# Patient Record
Sex: Male | Born: 1955
Health system: Southern US, Community
[De-identification: ages and names within clinical notes are randomized; demographics above are authoritative.]

## PROBLEM LIST (undated history)

## (undated) DIAGNOSIS — N200 Calculus of kidney: Secondary | ICD-10-CM

## (undated) DIAGNOSIS — E785 Hyperlipidemia, unspecified: Secondary | ICD-10-CM

## (undated) DIAGNOSIS — C4491 Basal cell carcinoma of skin, unspecified: Secondary | ICD-10-CM

## (undated) DIAGNOSIS — Z860100 Personal history of colon polyps, unspecified: Secondary | ICD-10-CM

## (undated) DIAGNOSIS — C641 Malignant neoplasm of right kidney, except renal pelvis: Secondary | ICD-10-CM

## (undated) DIAGNOSIS — I10 Essential (primary) hypertension: Secondary | ICD-10-CM

## (undated) DIAGNOSIS — R112 Nausea with vomiting, unspecified: Secondary | ICD-10-CM

## (undated) DIAGNOSIS — K219 Gastro-esophageal reflux disease without esophagitis: Secondary | ICD-10-CM

## (undated) DIAGNOSIS — Z905 Acquired absence of kidney: Secondary | ICD-10-CM

## (undated) DIAGNOSIS — M1990 Unspecified osteoarthritis, unspecified site: Secondary | ICD-10-CM

## (undated) DIAGNOSIS — N189 Chronic kidney disease, unspecified: Secondary | ICD-10-CM

## (undated) DIAGNOSIS — Z9889 Other specified postprocedural states: Secondary | ICD-10-CM

## (undated) DIAGNOSIS — Z87442 Personal history of urinary calculi: Secondary | ICD-10-CM

## (undated) HISTORY — DX: Essential (primary) hypertension: I10

## (undated) HISTORY — DX: Malignant neoplasm of right kidney, except renal pelvis: C64.1

## (undated) HISTORY — DX: Personal history of colon polyps, unspecified: Z86.0100

## (undated) HISTORY — DX: Basal cell carcinoma of skin, unspecified: C44.91

## (undated) HISTORY — PX: VASECTOMY: SHX75

## (undated) HISTORY — DX: Personal history of colonic polyps: Z86.010

## (undated) HISTORY — DX: Acquired absence of kidney: Z90.5

## (undated) HISTORY — DX: Hyperlipidemia, unspecified: E78.5

## (undated) HISTORY — PX: ESOPHAGEAL DILATION: SHX303

---

## 1987-03-23 HISTORY — PX: OTHER SURGICAL HISTORY: SHX169

## 1995-04-09 ENCOUNTER — Encounter: Payer: Self-pay | Admitting: Family Medicine

## 1995-04-09 LAB — CONVERTED CEMR LAB
Blood Glucose, Fasting: 100 mg/dL
RBC count: 5.48 10*6/uL
WBC, blood: 7.3 10*3/uL

## 1995-04-12 ENCOUNTER — Encounter: Payer: Self-pay | Admitting: Family Medicine

## 1995-04-12 LAB — CONVERTED CEMR LAB
PSA: 0.79 ng/mL
TSH: 0.79 microintl units/mL

## 1997-10-22 ENCOUNTER — Ambulatory Visit (HOSPITAL_COMMUNITY): Admission: RE | Admit: 1997-10-22 | Discharge: 1997-10-22 | Payer: Self-pay | Admitting: Internal Medicine

## 1998-09-12 ENCOUNTER — Encounter: Payer: Self-pay | Admitting: Family Medicine

## 1998-09-12 LAB — CONVERTED CEMR LAB
Blood Glucose, Fasting: 90 mg/dL
RBC count: 5.48 10*6/uL
WBC, blood: 6.5 10*3/uL

## 1999-04-29 ENCOUNTER — Encounter: Payer: Self-pay | Admitting: Internal Medicine

## 1999-04-29 ENCOUNTER — Ambulatory Visit (HOSPITAL_COMMUNITY): Admission: RE | Admit: 1999-04-29 | Discharge: 1999-04-29 | Payer: Self-pay | Admitting: Internal Medicine

## 2004-02-21 ENCOUNTER — Ambulatory Visit: Payer: Self-pay | Admitting: Family Medicine

## 2004-03-13 ENCOUNTER — Emergency Department: Payer: Self-pay | Admitting: Unknown Physician Specialty

## 2004-08-21 ENCOUNTER — Ambulatory Visit: Payer: Self-pay | Admitting: Internal Medicine

## 2004-09-18 ENCOUNTER — Ambulatory Visit: Payer: Self-pay | Admitting: Internal Medicine

## 2004-09-18 ENCOUNTER — Encounter (INDEPENDENT_AMBULATORY_CARE_PROVIDER_SITE_OTHER): Payer: Self-pay | Admitting: Specialist

## 2004-09-18 LAB — HM COLONOSCOPY

## 2005-01-21 ENCOUNTER — Ambulatory Visit: Payer: Self-pay | Admitting: Family Medicine

## 2005-07-20 ENCOUNTER — Encounter: Admission: RE | Admit: 2005-07-20 | Discharge: 2005-07-20 | Payer: Self-pay | Admitting: Family Medicine

## 2005-07-20 ENCOUNTER — Ambulatory Visit: Payer: Self-pay | Admitting: Family Medicine

## 2005-09-03 ENCOUNTER — Ambulatory Visit: Payer: Self-pay | Admitting: Family Medicine

## 2005-09-03 LAB — CONVERTED CEMR LAB
Blood Glucose, Fasting: 103 mg/dL
PSA: 1.04 ng/mL
TSH: 0.46 microintl units/mL

## 2005-09-10 ENCOUNTER — Ambulatory Visit: Payer: Self-pay | Admitting: Family Medicine

## 2005-10-07 LAB — FECAL OCCULT BLOOD, GUAIAC: Fecal Occult Blood: NEGATIVE

## 2005-10-08 ENCOUNTER — Ambulatory Visit: Payer: Self-pay | Admitting: Family Medicine

## 2005-10-15 ENCOUNTER — Ambulatory Visit: Payer: Self-pay | Admitting: Family Medicine

## 2005-11-12 ENCOUNTER — Ambulatory Visit: Payer: Self-pay | Admitting: Family Medicine

## 2006-08-18 ENCOUNTER — Ambulatory Visit: Payer: Self-pay | Admitting: Family Medicine

## 2006-08-18 ENCOUNTER — Telehealth (INDEPENDENT_AMBULATORY_CARE_PROVIDER_SITE_OTHER): Payer: Self-pay | Admitting: *Deleted

## 2006-12-20 ENCOUNTER — Ambulatory Visit: Payer: Self-pay | Admitting: Family Medicine

## 2006-12-21 ENCOUNTER — Telehealth (INDEPENDENT_AMBULATORY_CARE_PROVIDER_SITE_OTHER): Payer: Self-pay | Admitting: Internal Medicine

## 2007-03-30 ENCOUNTER — Telehealth (INDEPENDENT_AMBULATORY_CARE_PROVIDER_SITE_OTHER): Payer: Self-pay | Admitting: *Deleted

## 2007-04-19 ENCOUNTER — Ambulatory Visit: Payer: Self-pay | Admitting: Family Medicine

## 2007-05-18 ENCOUNTER — Encounter: Payer: Self-pay | Admitting: Family Medicine

## 2007-05-18 DIAGNOSIS — Z85828 Personal history of other malignant neoplasm of skin: Secondary | ICD-10-CM | POA: Insufficient documentation

## 2007-05-18 DIAGNOSIS — I10 Essential (primary) hypertension: Secondary | ICD-10-CM | POA: Insufficient documentation

## 2007-05-18 DIAGNOSIS — E785 Hyperlipidemia, unspecified: Secondary | ICD-10-CM | POA: Insufficient documentation

## 2007-05-18 DIAGNOSIS — R7309 Other abnormal glucose: Secondary | ICD-10-CM | POA: Insufficient documentation

## 2007-05-18 DIAGNOSIS — E782 Mixed hyperlipidemia: Secondary | ICD-10-CM | POA: Insufficient documentation

## 2007-07-26 ENCOUNTER — Ambulatory Visit: Payer: Self-pay | Admitting: Family Medicine

## 2007-08-07 ENCOUNTER — Encounter: Payer: Self-pay | Admitting: Family Medicine

## 2007-08-21 ENCOUNTER — Encounter: Payer: Self-pay | Admitting: Family Medicine

## 2007-08-25 ENCOUNTER — Encounter: Payer: Self-pay | Admitting: Family Medicine

## 2007-08-27 HISTORY — PX: SHOULDER ARTHROSCOPY W/ SUBACROMIAL DECOMPRESSION AND DISTAL CLAVICLE EXCISION: SHX2401

## 2007-09-25 ENCOUNTER — Encounter: Payer: Self-pay | Admitting: Family Medicine

## 2007-11-13 ENCOUNTER — Encounter: Payer: Self-pay | Admitting: Family Medicine

## 2007-12-04 ENCOUNTER — Ambulatory Visit: Payer: Self-pay | Admitting: Family Medicine

## 2008-08-07 ENCOUNTER — Encounter: Payer: Self-pay | Admitting: Internal Medicine

## 2008-08-07 ENCOUNTER — Encounter (INDEPENDENT_AMBULATORY_CARE_PROVIDER_SITE_OTHER): Payer: Self-pay | Admitting: *Deleted

## 2008-08-07 ENCOUNTER — Ambulatory Visit: Payer: Self-pay | Admitting: Family Medicine

## 2008-08-07 DIAGNOSIS — Z860100 Personal history of colon polyps, unspecified: Secondary | ICD-10-CM | POA: Insufficient documentation

## 2008-08-08 ENCOUNTER — Telehealth: Payer: Self-pay | Admitting: Family Medicine

## 2008-08-13 ENCOUNTER — Encounter (INDEPENDENT_AMBULATORY_CARE_PROVIDER_SITE_OTHER): Payer: Self-pay | Admitting: *Deleted

## 2009-08-05 ENCOUNTER — Encounter: Payer: Self-pay | Admitting: Family Medicine

## 2009-08-06 ENCOUNTER — Encounter: Payer: Self-pay | Admitting: Family Medicine

## 2009-08-06 ENCOUNTER — Ambulatory Visit: Payer: Self-pay | Admitting: Cardiovascular Disease

## 2009-08-06 ENCOUNTER — Telehealth: Payer: Self-pay | Admitting: Family Medicine

## 2009-08-06 ENCOUNTER — Observation Stay: Payer: Self-pay | Admitting: Internal Medicine

## 2009-08-12 ENCOUNTER — Ambulatory Visit: Payer: Self-pay | Admitting: Family Medicine

## 2009-09-03 ENCOUNTER — Encounter (INDEPENDENT_AMBULATORY_CARE_PROVIDER_SITE_OTHER): Payer: Self-pay | Admitting: *Deleted

## 2009-10-27 ENCOUNTER — Encounter (INDEPENDENT_AMBULATORY_CARE_PROVIDER_SITE_OTHER): Payer: Self-pay | Admitting: *Deleted

## 2009-12-01 ENCOUNTER — Ambulatory Visit: Payer: Self-pay | Admitting: Family Medicine

## 2009-12-01 DIAGNOSIS — R079 Chest pain, unspecified: Secondary | ICD-10-CM | POA: Insufficient documentation

## 2010-01-16 ENCOUNTER — Telehealth: Payer: Self-pay | Admitting: Family Medicine

## 2010-04-23 NOTE — Progress Notes (Signed)
Summary: pt needs referral for stress test today  Phone Note Call from Patient   Caller: Mardene Celeste at Dr. Milta Deiters office  [phone] x [phone] Call For: Shaune Leeks MD Summary of Call: Pt was seen at [facility] this morning for chest pain and hypertension.  He is scheduled to have a nuclear stress test tomorrow at [facility],ordered by Dr.Khan.  They need a referral done by 5:00 today. Initial call taken by: Lowella Petties CMA,  Aug 06, 2009 3:51 PM  Follow-up for Phone Call        Please ask their office to do the request. By our records, he has no insurance. Shaune Leeks MD  Aug 06, 2009 4:05 PM   Additional Follow-up for Phone Call Additional follow up Details #1::        Called Dr Rosine Beat office and told them they are the ordering Physicians office so they are responsible for the Authorization for nuclear stress test. She will take care of the Auth. Additional Follow-up by: Carlton Adam,  Aug 06, 2009 4:17 PM

## 2010-04-23 NOTE — Progress Notes (Signed)
Summary: wants to try omeprazole  Phone Note Call from Patient Call back at [phone]   Caller: Patient Call For: Hannah Beat MD Summary of Call: Pt was given script for nexium but this is too costly.  He is asking if he can get script for omeprazole.  Uses [facility] in [place].  Please let pt know if something is called in. Initial call taken by: Lowella Petties CMA, AAMA,  January 16, 2010 3:16 PM  Follow-up for Phone Call        yes Follow-up by: Hannah Beat MD,  January 19, 2010 6:44 AM  Additional Follow-up for Phone Call Additional follow up Details #1::        Patient advised.Consuello Masse CMA   Additional Follow-up by: Benny Lennert CMA Duncan Dull),  January 19, 2010 8:12 AM    New/Updated Medications: OMEPRAZOLE 20 MG CPDR (OMEPRAZOLE) one by mouth daily Prescriptions: OMEPRAZOLE 20 MG CPDR (OMEPRAZOLE) one by mouth daily  #30 x 11   Entered and Authorized by:   Hannah Beat MD   Signed by:   Hannah Beat MD on 01/19/2010   Method used:   Electronically to        [facility] (retail)       [address]       [place], [place]  [address]       Ph: [phone]       Fax: [phone]   RxID:   [id number]

## 2010-04-23 NOTE — Letter (Signed)
Summary: Colonoscopy Letter  [facility]  [address] [place], [place] [address]   Phone: [phone]  Fax: [phone]      September 03, 2009 MRN: [id number]   Nicholas Thompson [address] [place], [place]  [address]   Dear Mr. NAUGLE,   According to your medical record, it is time for you to schedule a Colonoscopy. The American Cancer Society recommends this procedure as a method to detect early colon cancer. Patients with a family history of colon cancer, or a personal history of colon polyps or inflammatory bowel disease are at increased risk.  This letter has been generated based on the recommendations made at the time of your procedure. If you feel that in your particular situation this may no longer apply, please contact our office.  Please call our office at [phone] to schedule this appointment or to update your records at your earliest convenience.  Thank you for cooperating with [place] to provide you with the very best care possible.   Sincerely,  Wilhemina Bonito. Marina Goodell, M.D.  [facility] [phone]

## 2010-04-23 NOTE — Letter (Signed)
[facility]   Imported By: Beau Fanny 08/08/2009 10:36:53  _____________________________________________________________________  External Attachment:    Type:   Image     Comment:   External Document  Appended Document: [facility]     Clinical Lists Changes  Observations: Added new observation of PAST SURG HX: Arthroscopy right knee ([facility]) 1989 Vasectomy (Dr Lemar Livings) EGD, DILATION :(03/1996) (DR.PERRY) EGD, DILATATION:(04/1999) COLONOSCOPY POLYPS , BENIGN --RECHECK IN 5 YEARS:(09/18/2004) EGD ESOPH. STRICTURE , DILATED GERD:(09/18/2004) R SHOULDER ARTHROSCOPIC SUBACR DECOMPR AND MUMFORD PROC  (08/27/2007) Poor Rehab Effort HOSP CP R/O'd Elevated D-Dimer CT Chest neg for PE 08/06/2009 CT Chest Neg PE 08/06/2009 (08/10/2009 14:08)       Past Surgical History:    Arthroscopy right knee ([facility]) 1989    Vasectomy (Dr Lemar Livings)    EGD, DILATION :(03/1996) (DR.PERRY)    EGD, DILATATION:(04/1999)    COLONOSCOPY POLYPS , BENIGN --RECHECK IN 5 YEARS:(09/18/2004)    EGD ESOPH. STRICTURE , DILATED GERD:(09/18/2004)    R SHOULDER ARTHROSCOPIC SUBACR DECOMPR AND MUMFORD PROC  (08/27/2007) Poor Rehab Effort    HOSP CP R/O'd Elevated D-Dimer CT Chest neg for PE 08/06/2009    CT Chest Neg PE 08/06/2009

## 2010-04-23 NOTE — Letter (Signed)
Summary: Instructions/[facility]  Instructions/[facility]   Imported By: Sherian Rein 12/08/2009 15:12:46  _____________________________________________________________________  External Attachment:    Type:   Image     Comment:   External Document

## 2010-04-23 NOTE — Letter (Signed)
Summary: Nadara Eaton letter  [place] [facility]  [address] [place], [place] [address]   Phone: [phone]  Fax: [phone]       10/27/2009 MRN: [id number]  JOSE CORVIN [address] [place], [place]  [address]  Dear Mr. [facility] and [facility] announce the retirement of Arta Silence, M.D., from full-time practice at the [facility] office effective September 18, 2009 and his plans of returning part-time.  It is important to Dr. Hetty Ely and to our practice that you understand that [facility] has seven physicians in our office for your health care needs.  We will continue to offer the same exceptional care that you have today.    Dr. Hetty Ely has spoken to many of you about his plans for retirement and returning part-time in the fall.   We will continue to work with you through the transition to schedule appointments for you in the office and meet the high standards that [employer] [facility] is committed to.   Again, it is with great pleasure that we share the news that Dr. Hetty Ely will return to [facility] in October of 2011 with a reduced schedule.    If you have any questions, or would like to request an appointment with one of our physicians, please call us at [phone] and press the option for Scheduling an appointment.  We take pleasure in providing you with excellent patient care and look forward to seeing you at your next office visit.  Our [facility] Physicians are:  Tillman Abide, M.D. Laurita Quint, M.D. Roxy Manns, M.D. Kerby Nora, M.D. Hannah Beat, M.D. Ruthe Mannan, M.D. We proudly welcomed Raechel Ache, M.D. and Eustaquio Boyden, M.D. to the practice in July/August 2011.  Sincerely,  [facility]

## 2010-04-23 NOTE — Assessment & Plan Note (Signed)
Summary: ESTABH FROM [facility]   Vital Signs:  Patient profile:   55 year old male Height:      72 inches Weight:      234 pounds BMI:     31.85 Temp:     98.5 degrees F oral Pulse rate:   64 / minute Pulse rhythm:   regular BP sitting:   140 / 100  (right arm) Cuff size:   regular  Vitals Entered By: Linde Gillis CMA Duncan Dull) (December 01, 2009 8:47 AM) CC: establish care from Dr. Hetty Ely   History of Present Illness: 55 year old male:  HTN: not at goalcompliant with his current medications. He was having some low pulses, and in the midst of his hospitalization for chest pain, the cardiologist changed him from atenolol to enalapril, now is doing better. The pressures have continued to trend high since this change. Asymptomatic.  Lipids: patient has been on cholesterol medication the past, but on review of the hospital records, this point, his lipid panel has normalized, with an LDL in the one teens, and a total cholesterol the 170s  arms went off the charts, pain when working with the bouscounts. Pain set in with his arm. Blood pressure was really high. So, they went to the ED.  Pulse was low   145/95  Current Problems (verified): 1)  Chest Pain  (ICD-786.50) 2)  Colonic Polyps, Hx of  (ICD-V12.72) 3)  Carcinoma, Basal Cell, Hx of  (ICD-V10.83) 4)  Hyperlipidemia  (ICD-272.4) 5)  Hypertension  (ICD-401.9) 6)  Hyperglycemia  (ICD-790.29)  Allergies (verified): No Known Drug Allergies  Past History:  Past medical, surgical, family and social histories (including risk factors) reviewed, and no changes noted (except as noted below).  Past Medical History: Colonic polyps, hx of CARCINOMA, BASAL CELL, HX OF (ICD-V10.83) HYPERLIPIDEMIA (ICD-272.4) HYPERTENSION (ICD-401.9) HYPERGLYCEMIA (ICD-790.29)  Ortho, [facility], [facility]  Past Surgical History: Arthroscopy right knee ([facility]) 1989 Vasectomy (Dr Lemar Livings) EGD, DILATION :(03/1996) (DR.PERRY) EGD,  DILATATION:(04/1999) COLONOSCOPY POLYPS , BENIGN --RECHECK IN 5 YEARS:(09/18/2004) EGD ESOPH. STRICTURE , DILATED GERD:(09/18/2004) R SHOULDER ARTHROSCOPIC SUBACR DECOMPR AND MUMFORD PROC  (08/27/2007) Poor Rehab Effort HOSP CP R/O'd Elevated D-Dimer CT Chest neg for PE 08/06/2009    -- neg nuclear stress, Dr. Welton Flakes 07/2009  Family History: Reviewed history from 05/18/2007 and no changes required. Father: deceased at age 47 yoa, +dm, cad, htn Mother: 39 YOA ALIVE ; BLADDER CANCER Siblings: 1BROTHER CV: +CAD FATHER HBP: + FATHER DM: + FATHER GOUT/ARTHRITIS: PROSTATE CANCER: + PGF LUNGS: +P-UNCLE -- BRAIN TUMOR BREAST/OVARIAN/ UTERINE CANCER: +MOTHER BLADDER CANCER DEPRESSION: ETOH/DRUG ABUSE:  OTHER: + STROKE MGF  Social History: Reviewed history from 05/18/2007 and no changes required. Marital Status: Married LIVES WITH WIFE  Children: 2 CHILDREN  Occupation: Government social research officer  Review of Systems       REVIEW OF SYSTEMS GEN: No acute illnesses, no fever, chills, sweats. CV: No chest pain or SOB - none now GI: No noted N or V Otherwise, pertinent positives and negatives are noted in the HPI.   Physical Exam  Additional Exam:  GEN: WDWN, NAD, Non-toxic, A & O x 3 HEENT: Atraumatic, Normocephalic. Neck supple. No masses, No LAD. Ears and Nose: No external deformity. CV: RRR, No M/G/R. No JVD. No thrill. No extra heart sounds. PULM: CTA B, no wheezes, crackles, rhonchi. No retractions. No resp. distress. No accessory muscle use. EXTR: No c/c/e NEURO: Normal gait.  PSYCH: Normally interactive. Conversant. Not depressed or anxious appearing.  Calm demeanor.     Impression & Recommendations:  Problem # 1:  HYPERTENSION (ICD-401.9) Assessment Deteriorated unstable, increase dose ACE  His updated medication list for this problem includes:    Enalapril Maleate 20 Mg Tabs (Enalapril maleate) .Marland Kitchen... 2 tabs by mouth daily  BP today: 140/100 Prior BP: 140/82 (08/07/2008)  Problem #  2:  HYPERLIPIDEMIA (ICD-272.4) Assessment: Improved  Problem # 3:  CHEST PAIN (ICD-786.50) Assessment: New Chest pain, rule out, neg neg nuclear stress test  Complete Medication List: 1)  Enalapril Maleate 20 Mg Tabs (Enalapril maleate) .... 2 tabs by mouth daily 2)  Nexium 40 Mg Cpdr (Esomeprazole magnesium) .... Take 1 tablet by mouth once a day 3)  Aspirin 81 Mg Tbec (Aspirin) .... One by mouth every day  Patient Instructions: 1)  CHECK BLOOD PRESSURES, IF CONSISTENTLY > 130/80, CALL OUR OFFICE 2)  f/u for CPX later in 2012, labs before 3)  Prephysical Labs, several days before, fasting 4)  BMP, HFP, FLP, CBC with diff, TSH, PSA: v77.91, v77.1, ,780.79, v76.44  Prescriptions: NEXIUM 40 MG  CPDR (ESOMEPRAZOLE MAGNESIUM) Take 1 tablet by mouth once a day  #90 x 3   Entered and Authorized by:   Hannah Beat MD   Signed by:   Hannah Beat MD on 12/01/2009   Method used:   Print then Give to Patient   RxID:   [id number] ENALAPRIL MALEATE 20 MG TABS (ENALAPRIL MALEATE) 2 tabs by mouth daily  #180 x 3   Entered and Authorized by:   Hannah Beat MD   Signed by:   Hannah Beat MD on 12/01/2009   Method used:   Print then Give to Patient   RxID:   [id number]   Current Allergies (reviewed today): No known allergies

## 2010-09-07 ENCOUNTER — Encounter: Payer: Self-pay | Admitting: Family Medicine

## 2010-09-08 ENCOUNTER — Encounter: Payer: Self-pay | Admitting: Family Medicine

## 2010-09-08 ENCOUNTER — Ambulatory Visit (INDEPENDENT_AMBULATORY_CARE_PROVIDER_SITE_OTHER): Payer: Self-pay | Admitting: Family Medicine

## 2010-09-08 VITALS — BP 150/92 | HR 92 | Temp 98.3°F | Ht 75.0 in | Wt 240.8 lb

## 2010-09-08 DIAGNOSIS — L02212 Cutaneous abscess of back [any part, except buttock]: Secondary | ICD-10-CM

## 2010-09-08 DIAGNOSIS — Z029 Encounter for administrative examinations, unspecified: Secondary | ICD-10-CM

## 2010-09-08 DIAGNOSIS — L02219 Cutaneous abscess of trunk, unspecified: Secondary | ICD-10-CM

## 2010-09-08 MED ORDER — DOXYCYCLINE HYCLATE 100 MG PO TABS: 100.0000 mg | ORAL_TABLET | Freq: Two times a day (BID) | ORAL | Status: AC

## 2010-09-08 NOTE — Patient Instructions (Signed)
Recheck Dr. Patsy Lager on Thursday this week (no charge at that OV, no copay)   Abscess/Boil Care After (Furuncle) An abscess (also called a boil or furuncle) is an infected area that contains a collection of pus. Signs and symptoms of an abscess include pain, tenderness, redness, or hardness, or you may feel a moveable soft area under your skin. An abscess can occur anywhere in the body. The infection may spread to surrounding tissues causing cellulitis. A cut (incision) by the surgeon was made over your abscess and the pus was drained out. Gauze may have been packed into the space to provide a drain that will allow the cavity to heal from the inside outwards. The boil may be painful for 5 to 7 days. Most people with a boil do not have high fevers. Your abscess, if seen early, may not have localized, and may not have been lanced. If not, another appointment may be required for this if it does not get better on its own or with medications. HOME CARE INSTRUCTIONS  Only take over-the-counter or prescription medicines for pain, discomfort, or fever as directed by your caregiver.   When you bathe, soak and then remove gauze or iodoform packs at least daily or as directed by your caregiver. You may then wash the wound gently with mild soapy water. Repack with gauze or do as your caregiver directs.  SEEK IMMEDIATE MEDICAL CARE IF:  You develop increased pain, swelling, redness, drainage, or bleeding in the wound site.   You develop signs of generalized infection including muscle aches, chills, fever, or a general ill feeling.   An oral temperature above 101 develops, not controlled by medication.  See your caregiver for a recheck if you develop any of the symptoms described above. If medications (antibiotics) were prescribed, take them as directed. Document Released: 09/24/2004 Document Re-Released: 08/26/2009 [facility] Patient Information 2011 [place], [place].

## 2010-09-08 NOTE — Progress Notes (Signed)
Nicholas Thompson, a 55 y.o. male presents today in the office for the following:   Raised area on his back, and now on top of it, it has come to a head.  No fever, chills, systemic symptoms. Redness throughout this area in his midback. A small amount of pus was expressed last night by his wife.  Painful to touch  Also with boyscout form.  Patient Active Problem List  Diagnoses  . HYPERLIPIDEMIA  . HYPERTENSION  . HYPERGLYCEMIA  . CARCINOMA, BASAL CELL, HX OF  . COLONIC POLYPS, HX OF   Past Medical History  Diagnosis Date  . Personal history of other malignant neoplasm of skin     bASAL CELL  . Other and unspecified hyperlipidemia   . Unspecified essential hypertension   . History of colonic polyps    Past Surgical History  Procedure Date  . Arthroscopy of knee 1989  . Vasectomy   . Esophageal dilation 1998 & 2001&2006    3 times  . Shoulder arthroscopy w/ subacromial decompression and distal clavicle excision 08-27-2007   History  Substance Use Topics  . Smoking status: Never Smoker   . Smokeless tobacco: Not on file  . Alcohol Use: Not on file   Family History  Problem Relation Age of Onset  . Cancer Mother     bladder  . Diabetes Father   . Hyperlipidemia Father   . Coronary artery disease Father   . Cancer Paternal Uncle     brain  . Stroke Maternal Grandfather   . Cancer Paternal Grandfather     lung   No Known Allergies Current Outpatient Prescriptions on File Prior to Visit  Medication Sig Dispense Refill  . enalapril (VASOTEC) 20 MG tablet Take 40 mg by mouth daily.        Marland Kitchen omeprazole (PRILOSEC) 20 MG capsule Take 20 mg by mouth daily.        Marland Kitchen aspirin 81 MG tablet Take 81 mg by mouth daily.         ROS: GEN: Acute illness details above GI: Tolerating PO intake GU: maintaining adequate hydration and urination Pulm: No SOB Interactive and getting along well at home.  Otherwise, ROS is as per the HPI.   Physical Exam  Blood pressure 150/92, pulse  92, temperature 98.3 F (36.8 C), temperature source Oral, height 6\' 3"  (1.905 m), weight 240 lb 12.8 oz (109.226 kg), SpO2 98.00%.  GEN: WDWN, NAD, Non-toxic, A & O x 3 HEENT: Atraumatic, Normocephalic. Neck supple. No masses, No LAD. Ears and Nose: No external deformity. CV: RRR, No M/G/R. No JVD. No thrill. No extra heart sounds. PULM: CTA B, no wheezes, crackles, rhonchi. No retractions. No resp. distress. No accessory muscle use. ABD: S, NT, ND, +BS. No rebound tenderness. No HSM.  Skin: posterior upper thoracic area, midline, approx 3 cm across, red, fluctuant area, TTP. EXTR: No c/c/e NEURO Normal gait.  PSYCH: Normally interactive. Conversant. Not depressed or anxious appearing.  Calm demeanor.  MSK: full ROM, str 5/5  A/P: Abscess, back, complicated. I and D, oral ABX  I&D Indication: abscess, back, complicated Pt complaints of: erythema, pain, swelling Location: back, thoracic Size: 3 cm Verbal informed consent obtained.  Pt aware of risks not limited to but including infection, bleeding, damage to near by organs. Prep: etoh/betadine Anesthesia: 1%lidocaine with epi, good effect Incision made with #11 blade Would explored and loculations removed Amount expressed: 5 cc Wound packed with iodoform gauze Tolerated well Routine postprocedure instructions  d/w pt- remove packing in 24-48h, keep area clean and bandaged, follow up if concerns/spreading erythema/pain.  2. 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

## 2010-09-08 NOTE — Progress Notes (Signed)
Addended by: Hannah Beat on: 09/08/2010 01:12 PM   Modules accepted: Orders

## 2010-09-10 ENCOUNTER — Encounter: Payer: Self-pay | Admitting: Family Medicine

## 2010-09-10 ENCOUNTER — Ambulatory Visit (INDEPENDENT_AMBULATORY_CARE_PROVIDER_SITE_OTHER): Payer: BC Managed Care – PPO | Admitting: Family Medicine

## 2010-09-10 VITALS — BP 140/82 | HR 65 | Temp 98.2°F | Ht 75.0 in | Wt 243.8 lb

## 2010-09-10 DIAGNOSIS — L02212 Cutaneous abscess of back [any part, except buttock]: Secondary | ICD-10-CM

## 2010-09-10 DIAGNOSIS — L03319 Cellulitis of trunk, unspecified: Secondary | ICD-10-CM

## 2010-09-10 DIAGNOSIS — L02219 Cutaneous abscess of trunk, unspecified: Secondary | ICD-10-CM

## 2010-09-10 NOTE — Progress Notes (Signed)
55 year old male:  F/u I and D. Probable infected sebaceous cyst - draining a little bit.  Mild pain and redness.  Overall, looking better.  Packing removed.  Shower 2-4 times a day, let water flow over I and D Expect healing to take several weeks

## 2010-09-11 LAB — WOUND CULTURE
Gram Stain: NONE SEEN
Gram Stain: NONE SEEN
Organism ID, Bacteria: NO GROWTH

## 2010-12-04 ENCOUNTER — Telehealth: Payer: Self-pay | Admitting: *Deleted

## 2010-12-04 NOTE — Telephone Encounter (Signed)
Called patient to schedule colon.  He declined at this time and will call back and schedule.

## 2010-12-18 ENCOUNTER — Other Ambulatory Visit: Payer: Self-pay | Admitting: Family Medicine

## 2011-01-18 ENCOUNTER — Other Ambulatory Visit: Payer: Self-pay | Admitting: Family Medicine

## 2011-05-06 ENCOUNTER — Encounter: Payer: BC Managed Care – PPO | Admitting: Family Medicine

## 2011-05-26 ENCOUNTER — Ambulatory Visit (INDEPENDENT_AMBULATORY_CARE_PROVIDER_SITE_OTHER): Payer: BC Managed Care – PPO | Admitting: Family Medicine

## 2011-05-26 ENCOUNTER — Encounter: Payer: Self-pay | Admitting: Family Medicine

## 2011-05-26 VITALS — BP 130/90 | HR 71 | Temp 98.2°F | Ht 75.0 in | Wt 237.8 lb

## 2011-05-26 DIAGNOSIS — Z23 Encounter for immunization: Secondary | ICD-10-CM

## 2011-05-26 DIAGNOSIS — Z125 Encounter for screening for malignant neoplasm of prostate: Secondary | ICD-10-CM

## 2011-05-26 DIAGNOSIS — Z0000 Encounter for general adult medical examination without abnormal findings: Secondary | ICD-10-CM

## 2011-05-26 DIAGNOSIS — L570 Actinic keratosis: Secondary | ICD-10-CM

## 2011-05-26 LAB — HEPATIC FUNCTION PANEL: Albumin: 4 g/dL (ref 3.5–5.2)

## 2011-05-26 LAB — BASIC METABOLIC PANEL
BUN: 13 mg/dL (ref 6–23)
Calcium: 9.4 mg/dL (ref 8.4–10.5)
Chloride: 107 mEq/L (ref 96–112)
Creatinine, Ser: 0.6 mg/dL (ref 0.4–1.5)
GFR: 137.15 mL/min (ref >60.00)

## 2011-05-26 LAB — CBC WITH DIFFERENTIAL/PLATELET
Basophils Absolute: 0 10*3/uL (ref 0.0–0.1)
Eosinophils Absolute: 0.1 10*3/uL (ref 0.0–0.7)
HCT: 46.1 % (ref 39.0–52.0)
Hemoglobin: 16.1 g/dL (ref 13.0–17.0)
Lymphocytes Relative: 23.5 % (ref 12.0–46.0)
Lymphs Abs: 1.7 10*3/uL (ref 0.7–4.0)
MCHC: 34.9 g/dL (ref 30.0–36.0)
Neutro Abs: 4.8 10*3/uL (ref 1.4–7.7)
RDW: 12.6 % (ref 11.5–14.6)

## 2011-05-26 LAB — LIPID PANEL
Cholesterol: 183 mg/dL (ref 0–200)
HDL: 49.9 mg/dL (ref >39.00)
VLDL: 13.4 mg/dL (ref 0.0–40.0)

## 2011-05-26 NOTE — Patient Instructions (Signed)
REFERRAL: GO THE THE FRONT ROOM AT THE ENTRANCE OF OUR CLINIC, NEAR CHECK IN. ASK FOR MARION. SHE WILL HELP YOU SET UP YOUR REFERRAL. DATE: TIME:  

## 2011-05-26 NOTE — Progress Notes (Signed)
Patient Name: Nicholas Thompson Date of Birth: 28-Dec-1955 Medical Record Number: [id number] Gender: male Date of Encounter: 05/26/2011  History of Present Illness:  Nicholas Thompson is a 56 y.o. very pleasant male patient who presents with the following:  Preventative Health Maintenance Visit:  Health Maintenance Summary Reviewed and updated, unless pt declines services.  Tobacco History Reviewed. Alcohol: No concerns, no excessive use Exercise Habits: Some activity, rec at least 30 mins 5 times a week STD concerns: no risk or activity to increase risk Drug Use: None Encouraged self-testicular check  No real issues Some aches and pains Active outdoors  Frequent urination.   Patient Active Problem List  Diagnoses  . HYPERLIPIDEMIA  . HYPERTENSION  . HYPERGLYCEMIA  . CARCINOMA, BASAL CELL, HX OF  . COLONIC POLYPS, HX OF   Past Medical History  Diagnosis Date  . Personal history of other malignant neoplasm of skin     bASAL CELL  . Other and unspecified hyperlipidemia   . Unspecified essential hypertension   . History of colonic polyps    Past Surgical History  Procedure Date  . Arthroscopy of knee 1989  . Vasectomy   . Esophageal dilation 1998 & 2001&2006    3 times  . Shoulder arthroscopy w/ subacromial decompression and distal clavicle excision 08-27-2007   History  Substance Use Topics  . Smoking status: Never Smoker   . Smokeless tobacco: Not on file  . Alcohol Use: Not on file   Family History  Problem Relation Age of Onset  . Cancer Mother     bladder  . Diabetes Father   . Hyperlipidemia Father   . Coronary artery disease Father   . Cancer Paternal Uncle     brain  . Stroke Maternal Grandfather   . Cancer Paternal Grandfather     lung   No Known Allergies  Medication list has been reviewed and updated.  Review of Systems:  General: Denies fever, chills, sweats. No significant weight loss. Eyes: Denies blurring,significant itching ENT: Denies  earache, sore throat, and hoarseness. Cardiovascular: Denies chest pains, palpitations, dyspnea on exertion Respiratory: Denies cough, dyspnea at rest,wheeezing Breast: no concerns about lumps GI: Denies nausea, vomiting, diarrhea, constipation, change in bowel habits, abdominal pain, melena, hematochezia GU: Denies penile discharge, ED. No STD concerns. Decreased outflow mildly Musculoskeletal: Denies back pain, joint pain Derm: Denies rash, itching Neuro: Denies  paresthesias, frequent falls, frequent headaches Psych: Denies depression, anxiety Endocrine: Denies cold intolerance, heat intolerance, polydipsia Heme: Denies enlarged lymph nodes Allergy: No hayfever  Physical Examination: Filed Vitals:   05/26/11 0827  BP: 130/90  Pulse: 71  Temp: 98.2 F (36.8 C)  TempSrc: Oral  Height: 6\' 3"  (1.905 m)  Weight: 237 lb 12.8 oz (107.865 kg)  SpO2: 98%    Body mass index is 29.72 kg/(m^2).   Wt Readings from Last 3 Encounters:  05/26/11 237 lb 12.8 oz (107.865 kg)  09/10/10 243 lb 12.8 oz (110.587 kg)  09/08/10 240 lb 12.8 oz (109.226 kg)    GEN: well developed, well nourished, no acute distress Eyes: conjunctiva and lids normal, PERRLA, EOMI ENT: TM clear, nares clear, oral exam WNL Neck: supple, no lymphadenopathy, no thyromegaly, no JVD Pulm: clear to auscultation and percussion, respiratory effort normal CV: regular rate and rhythm, S1-S2, no murmur, rub or gallop, no bruits, peripheral pulses normal and symmetric, no cyanosis, clubbing, edema or varicosities Chest: no scars, masses GI: soft, non-tender; no hepatosplenomegaly, masses; active bowel sounds all quadrants GU: no  hernia, testicular mass, penile discharge, or prostate enlargement Lymph: no cervical, axillary or inguinal adenopathy MSK: gait normal, muscle tone and strength WNL, no joint swelling, effusions, discoloration, crepitus  SKIN: clear, good turgor, color WNL. Crusty areas, diffuse and intermittent on  scalp Neuro: normal mental status, normal strength, sensation, and motion Psych: alert; oriented to person, place and time, normally interactive and not anxious or depressed in appearance.   Assessment and Plan:  1. Routine general medical examination at a health care facility  Basic metabolic panel, CBC with Differential, Hepatic function panel, Lipid panel, PSA  2. Special screening for malignant neoplasm of prostate  PSA  3. Actinic keratosis  Ambulatory referral to Dermatology  4. Immunization due  Tdap vaccine greater than or equal to 7yo IM    The patient's preventative maintenance and recommended screening tests for an annual wellness exam were reviewed in full today. Brought up to date unless services declined.  Counselled on the importance of diet, exercise, and its role in overall health and mortality. The patient's FH and SH was reviewed, including their home life, tobacco status, and drug and alcohol status.   Also have derm check -- he has multile ? AK's throughout scalp area

## 2011-07-09 ENCOUNTER — Telehealth: Payer: Self-pay

## 2011-07-09 NOTE — Telephone Encounter (Signed)
Pt left v/m about a billing question. Patient notified as instructed by telephone v/m (pts wife said could leave detailed message on pt cell) to call [phone] for billing questions.

## 2011-08-18 ENCOUNTER — Other Ambulatory Visit: Payer: Self-pay | Admitting: Family Medicine

## 2011-08-19 ENCOUNTER — Other Ambulatory Visit: Payer: Self-pay | Admitting: *Deleted

## 2011-08-19 NOTE — Telephone Encounter (Signed)
Opened in error

## 2011-10-14 ENCOUNTER — Other Ambulatory Visit: Payer: Self-pay | Admitting: Family Medicine

## 2012-03-16 ENCOUNTER — Ambulatory Visit (INDEPENDENT_AMBULATORY_CARE_PROVIDER_SITE_OTHER): Payer: BC Managed Care – PPO | Admitting: Family Medicine

## 2012-03-16 ENCOUNTER — Encounter: Payer: Self-pay | Admitting: Family Medicine

## 2012-03-16 VITALS — BP 130/70 | HR 84 | Temp 100.6°F | Ht 75.0 in | Wt 237.0 lb

## 2012-03-16 DIAGNOSIS — J111 Influenza due to unidentified influenza virus with other respiratory manifestations: Secondary | ICD-10-CM

## 2012-03-16 MED ORDER — HYDROCODONE-HOMATROPINE 5-1.5 MG/5ML PO SYRP: ORAL_SOLUTION | ORAL | Status: DC

## 2012-03-16 MED ORDER — OSELTAMIVIR PHOSPHATE 75 MG PO CAPS: 75.0000 mg | ORAL_CAPSULE | Freq: Two times a day (BID) | ORAL | Status: DC

## 2012-03-16 NOTE — Progress Notes (Signed)
   Nature conservation officer at [facility] [address] [place] [address] Phone: [phone] Fax: [phone]  Date:  03/16/2012   Name:  Nicholas Thompson   DOB:  10-Jul-1955   MRN:  [id number] Gender: male Age: 56 y.o.  PCP:  Nicholas Beat, MD   History of Present Illness:  Nicholas Thompson presents with runny nose, sneezing, cough, sore throat, malaise, myalgias, arthralgia, chills, and fever. 2 day history unk recent exposure to others with similar symptoms.  Really bad body aches, tightness in chest, and forces a cough that is really bad a ribcage is sore. 100 some now. Started on Tuesday   The patent denies sore throat as the primary complaint. Denies sthortness of breath/wheezing, otalgia, facial pain, abdominal pain, changes in bowel or bladder.  Generally feels terrible  Tmax: 100.6  PMH, PHS, Allergies, Problem List, Medications, Family History, and Social History have all been reviewed.  Review of Systems: as above, eating and drinking - tolerating PO. Urinating normally. No excessive vomitting or diarrhea. O/w as above.  Physical Exam:  Filed Vitals:   03/16/12 1056  BP: 130/70  Pulse: 84  Temp: 100.6 F (38.1 C)  TempSrc: Oral  Height: 6\' 3"  (1.905 m)  Weight: 237 lb (107.502 kg)  SpO2: 97%    Gen: WDWN, NAD; A & O x3, cooperative. Pleasant.Globally Non-toxic HEENT: Normocephalic and atraumatic. Throat clear, w/o exudate, R TM clear, L TM - good landmarks, No fluid present. rhinnorhea. No frontal or maxillary sinus T. MMM NECK: Anterior cervical  LAD is absent CV: RRR, No M/G/R, cap refill <2 sec PULM: Breathing comfortably in no respiratory distress. no wheezing, crackles, rhonchi ABD: S,NT,ND,+BS. No HSM. No rebound. EXT: No c/c/e PSYCH: Friendly, good eye contact MSK: Nml gait  Assessment and Plan: 1. Influenza: The patient's clinical exam and history is consistent with a diagnosis of influenza. Tamiflu and hycodan Supportive care. Fluids. Cough  medicines as needed  Anti-pyretics.  Infection control emphasized, including OOW or school until AF 24 hours.

## 2012-06-26 ENCOUNTER — Encounter: Payer: Self-pay | Admitting: *Deleted

## 2012-06-26 ENCOUNTER — Encounter: Payer: Self-pay | Admitting: Family Medicine

## 2012-06-26 ENCOUNTER — Ambulatory Visit (INDEPENDENT_AMBULATORY_CARE_PROVIDER_SITE_OTHER): Payer: BC Managed Care – PPO | Admitting: Family Medicine

## 2012-06-26 VITALS — BP 140/92 | HR 60 | Temp 98.3°F | Ht 75.0 in | Wt 241.5 lb

## 2012-06-26 DIAGNOSIS — R7309 Other abnormal glucose: Secondary | ICD-10-CM

## 2012-06-26 DIAGNOSIS — Z79899 Other long term (current) drug therapy: Secondary | ICD-10-CM

## 2012-06-26 DIAGNOSIS — E785 Hyperlipidemia, unspecified: Secondary | ICD-10-CM

## 2012-06-26 DIAGNOSIS — Z0000 Encounter for general adult medical examination without abnormal findings: Secondary | ICD-10-CM

## 2012-06-26 DIAGNOSIS — Z125 Encounter for screening for malignant neoplasm of prostate: Secondary | ICD-10-CM

## 2012-06-26 DIAGNOSIS — I10 Essential (primary) hypertension: Secondary | ICD-10-CM

## 2012-06-26 LAB — HEPATIC FUNCTION PANEL
Alkaline Phosphatase: 73 U/L (ref 39–117)
Bilirubin, Direct: 0.2 mg/dL (ref 0.0–0.3)

## 2012-06-26 LAB — BASIC METABOLIC PANEL
CO2: 28 mEq/L (ref 19–32)
Chloride: 104 mEq/L (ref 96–112)
Creatinine, Ser: 0.7 mg/dL (ref 0.4–1.5)
Glucose, Bld: 101 mg/dL — ABNORMAL HIGH (ref 70–99)

## 2012-06-26 LAB — CBC WITH DIFFERENTIAL/PLATELET
Basophils Relative: 0.6 % (ref 0.0–3.0)
Eosinophils Absolute: 0.2 10*3/uL (ref 0.0–0.7)
Hemoglobin: 15.7 g/dL (ref 13.0–17.0)
MCHC: 34.2 g/dL (ref 30.0–36.0)
MCV: 90 fl (ref 78.0–100.0)
Monocytes Absolute: 0.6 10*3/uL (ref 0.1–1.0)
Neutro Abs: 4.5 10*3/uL (ref 1.4–7.7)
Neutrophils Relative %: 61.3 % (ref 43.0–77.0)
RBC: 5.11 Mil/uL (ref 4.22–5.81)

## 2012-06-26 LAB — LIPID PANEL: Total CHOL/HDL Ratio: 4

## 2012-06-26 LAB — PSA: PSA: 1.4 ng/mL (ref 0.10–4.00)

## 2012-06-26 MED ORDER — TAMSULOSIN HCL 0.4 MG PO CAPS: 0.4000 mg | ORAL_CAPSULE | Freq: Every day | ORAL | Status: DC

## 2012-06-26 NOTE — Progress Notes (Signed)
Nature conservation officer at [facility] [address] [place] [address] Phone: [phone] Fax: [phone]  Date:  06/26/2012   Name:  Nicholas Thompson   DOB:  1955/07/12   MRN:  [id number] Gender: male Age: 57 y.o.  Primary Physician:  Hannah Beat, MD  Evaluating MD: Hannah Beat, MD   Chief Complaint: Annual Exam   History of Present Illness:  Nicholas Thompson is a 57 y.o. pleasant patient who presents with the following:  CPX  02/2012 - had flu, terrible.   Goes to the bathroom a lot --- 2-3 times at night, every hour during day.   Preventative Health Maintenance Visit:  Health Maintenance Summary Reviewed and updated, unless pt declines services.  Tobacco History Reviewed. Alcohol: No concerns, no excessive use Exercise Habits: minimal activity STD concerns: no risk or activity to increase risk Drug Use: None Encouraged self-testicular check  Health Maintenance  Topic Date Due  . Influenza Vaccine  11/20/2012  . Colonoscopy  09/19/2014  . Tetanus/tdap  05/25/2021    Labs reviewed with the patient.  Results for orders placed in visit on 06/26/12  BASIC METABOLIC PANEL      Result Value Range   Sodium 140  135 - 145 mEq/L   Potassium 4.4  3.5 - 5.1 mEq/L   Chloride 104  96 - 112 mEq/L   CO2 28  19 - 32 mEq/L   Glucose, Bld 101 (*) 70 - 99 mg/dL   BUN 16  6 - 23 mg/dL   Creatinine, Ser 0.7  0.4 - 1.5 mg/dL   Calcium 9.4  8.4 - 84.6 mg/dL   GFR 962.95  >28.41 mL/min  CBC WITH DIFFERENTIAL      Result Value Range   WBC 7.4  4.5 - 10.5 K/uL   RBC 5.11  4.22 - 5.81 Mil/uL   Hemoglobin 15.7  13.0 - 17.0 g/dL   HCT 32.4  40.1 - 02.7 %   MCV 90.0  78.0 - 100.0 fl   MCHC 34.2  30.0 - 36.0 g/dL   RDW 25.3  66.4 - 40.3 %   Platelets 252.0  150.0 - 400.0 K/uL   Neutrophils Relative 61.3  43.0 - 77.0 %   Lymphocytes Relative 27.4  12.0 - 46.0 %   Monocytes Relative 8.3  3.0 - 12.0 %   Eosinophils Relative 2.4  0.0 - 5.0 %   Basophils Relative 0.6  0.0 -  3.0 %   Neutro Abs 4.5  1.4 - 7.7 K/uL   Lymphs Abs 2.0  0.7 - 4.0 K/uL   Monocytes Absolute 0.6  0.1 - 1.0 K/uL   Eosinophils Absolute 0.2  0.0 - 0.7 K/uL   Basophils Absolute 0.0  0.0 - 0.1 K/uL  LIPID PANEL      Result Value Range   Cholesterol 193  0 - 200 mg/dL   Triglycerides 474.2  0.0 - 149.0 mg/dL   HDL 59.56  >38.75 mg/dL   VLDL 64.3  0.0 - 32.9 mg/dL   LDL Cholesterol 518 (*) 0 - 99 mg/dL   Total CHOL/HDL Ratio 4    PSA      Result Value Range   PSA 1.40  0.10 - 4.00 ng/mL  HEPATIC FUNCTION PANEL      Result Value Range   Total Bilirubin 1.1  0.3 - 1.2 mg/dL   Bilirubin, Direct 0.2  0.0 - 0.3 mg/dL   Alkaline Phosphatase 73  39 - 117 U/L   AST  22  0 - 37 U/L   ALT 22  0 - 53 U/L   Total Protein 7.0  6.0 - 8.3 g/dL   Albumin 4.1  3.5 - 5.2 g/dL     Patient Active Problem List  Diagnosis  . HYPERLIPIDEMIA  . HYPERTENSION  . HYPERGLYCEMIA  . CARCINOMA, BASAL CELL, HX OF  . COLONIC POLYPS, HX OF    Past Medical History  Diagnosis Date  . Personal history of other malignant neoplasm of skin     bASAL CELL  . Other and unspecified hyperlipidemia   . Unspecified essential hypertension   . History of colonic polyps     Past Surgical History  Procedure Laterality Date  . Arthroscopy of knee  1989  . Vasectomy    . Esophageal dilation  1998 & 2001&2006    3 times  . Shoulder arthroscopy w/ subacromial decompression and distal clavicle excision  08-27-2007    History   Social History  . Marital Status: Married    Spouse Name: N/A    Number of Children: 2  . Years of Education: N/A   Occupational History  . sales    Social History Main Topics  . Smoking status: Never Smoker   . Smokeless tobacco: Not on file  . Alcohol Use: Not on file  . Drug Use: Not on file  . Sexually Active: Not on file   Other Topics Concern  . Not on file   Social History Narrative  . No narrative on file    Family History  Problem Relation Age of Onset  . Cancer  Mother     bladder  . Diabetes Father   . Hyperlipidemia Father   . Coronary artery disease Father   . Cancer Paternal Uncle     brain  . Stroke Maternal Grandfather   . Cancer Paternal Grandfather     lung    No Known Allergies  Medication list has been reviewed and updated.  Outpatient Prescriptions Prior to Visit  Medication Sig Dispense Refill  . enalapril (VASOTEC) 20 MG tablet TAKE TWO TABLETS BY MOUTH EVERY DAY  180 tablet  2  . omeprazole (PRILOSEC) 20 MG capsule TAKE ONE CAPSULE BY MOUTH EVERY DAY  30 capsule  11  . HYDROcodone-homatropine (HYCODAN) 5-1.5 MG/5ML syrup 1 tsp po at night before bed prn cough  240 mL  0  . oseltamivir (TAMIFLU) 75 MG capsule Take 1 capsule (75 mg total) by mouth 2 (two) times daily.  10 capsule  0   No facility-administered medications prior to visit.    Review of Systems:   General: Denies fever, chills, sweats. No significant weight loss. Eyes: Denies blurring,significant itching ENT: Denies earache, sore throat, and hoarseness. Cardiovascular: Denies chest pains, palpitations, dyspnea on exertion Respiratory: Denies cough, dyspnea at rest,wheeezing Breast: no concerns about lumps GI: Denies nausea, vomiting, diarrhea, constipation, change in bowel habits, abdominal pain, melena, hematochezia GU: FREQUENT URINATION DAY AND NIGHT Musculoskeletal: Denies back pain, joint pain Derm: Denies rash, itching Neuro: Denies  paresthesias, frequent falls, frequent headaches Psych: Denies depression, anxiety Endocrine: Denies cold intolerance, heat intolerance, polydipsia Heme: Denies enlarged lymph nodes Allergy: No hayfever   Physical Examination: BP 140/92  Pulse 60  Temp(Src) 98.3 F (36.8 C) (Oral)  Ht 6\' 3"  (1.905 m)  Wt 241 lb 8 oz (109.544 kg)  BMI 30.19 kg/m2  SpO2 98%  Ideal Body Weight: Weight in (lb) to have BMI = 25: 199.6   Wt Readings from  Last 3 Encounters:  06/26/12 241 lb 8 oz (109.544 kg)  03/16/12 237 lb  (107.502 kg)  05/26/11 237 lb 12.8 oz (107.865 kg)    GEN: well developed, well nourished, no acute distress Eyes: conjunctiva and lids normal, PERRLA, EOMI ENT: TM clear, nares clear, oral exam WNL Neck: supple, no lymphadenopathy, no thyromegaly, no JVD Pulm: clear to auscultation and percussion, respiratory effort normal CV: regular rate and rhythm, S1-S2, no murmur, rub or gallop, no bruits, peripheral pulses normal and symmetric, no cyanosis, clubbing, edema or varicosities Chest: no scars, masses GI: soft, non-tender; no hepatosplenomegaly, masses; active bowel sounds all quadrants GU: no hernia, testicular mass, penile discharge, AND MODERATE PROSTATE ENLARGEMENT Lymph: no cervical, axillary or inguinal adenopathy MSK: gait normal, muscle tone and strength WNL, no joint swelling, effusions, discoloration, crepitus  SKIN: AREA ON LEFT EAR AND FOREHEAD THAT APPEAR ULCERATED Neuro: normal mental status, normal strength, sensation, and motion Psych: alert; oriented to person, place and time, normally interactive and not anxious or depressed in appearance.   Assessment and Plan:  Routine general medical examination at a health care facility  HYPERLIPIDEMIA - Plan: Lipid panel  HYPERGLYCEMIA - Plan: Basic metabolic panel  HYPERTENSION  Encounter for long-term (current) use of other medications - Plan: CBC with Differential, Hepatic function panel  Special screening examination for neoplasm of prostate - Plan: PSA  The patient's preventative maintenance and recommended screening tests for an annual wellness exam were reviewed in full today. Brought up to date unless services declined.  Counselled on the importance of diet, exercise, and its role in overall health and mortality. The patient's FH and SH was reviewed, including their home life, tobacco status, and drug and alcohol status.   Trial of flomax for bph  Orders Today:  Orders Placed This Encounter  Procedures  .  Basic metabolic panel  . CBC with Differential  . Lipid panel  . PSA  . Hepatic function panel    Updated Medication List: (Includes new medications, updates to list, dose adjustments) Meds ordered this encounter  Medications  . tamsulosin (FLOMAX) 0.4 MG CAPS    Sig: Take 1 capsule (0.4 mg total) by mouth daily.    Dispense:  30 capsule    Refill:  3    Medications Discontinued: Medications Discontinued During This Encounter  Medication Reason  . oseltamivir (TAMIFLU) 75 MG capsule Error  . HYDROcodone-homatropine (HYCODAN) 5-1.5 MG/5ML syrup Error      Signed, Mylez Venable T. Nakaiya Beddow, MD 06/26/2012 8:52 AM

## 2012-07-05 ENCOUNTER — Other Ambulatory Visit: Payer: Self-pay | Admitting: Family Medicine

## 2012-07-06 ENCOUNTER — Encounter: Payer: Self-pay | Admitting: Family Medicine

## 2012-07-06 ENCOUNTER — Ambulatory Visit (INDEPENDENT_AMBULATORY_CARE_PROVIDER_SITE_OTHER): Payer: BC Managed Care – PPO | Admitting: Family Medicine

## 2012-07-06 VITALS — BP 120/80 | HR 58 | Wt 245.0 lb

## 2012-07-06 DIAGNOSIS — L237 Allergic contact dermatitis due to plants, except food: Secondary | ICD-10-CM

## 2012-07-06 DIAGNOSIS — L255 Unspecified contact dermatitis due to plants, except food: Secondary | ICD-10-CM

## 2012-07-06 MED ORDER — PREDNISONE 20 MG PO TABS: ORAL_TABLET | ORAL | Status: DC

## 2012-07-06 NOTE — Progress Notes (Signed)
Nature conservation officer at [facility] [address] [place] [address] Phone: [phone] Fax: [phone]  Date:  07/06/2012   Name:  Nicholas Thompson   DOB:  1955/06/08   MRN:  [id number] Gender: male Age: 57 y.o.  Primary Physician:  Hannah Beat, MD  Evaluating MD: Hannah Beat, MD   Chief Complaint: Rash   History of Present Illness:  Nicholas Thompson is a 57 y.o. pleasant patient who presents with the following:  Arms and chest - very sensitive to it. Poison Ivy and [employer]. He has a relatively extensive K. Shoulders arms, and it is starting to go on his chest. He is very allergic to poison ivy.  Patient Active Problem List  Diagnosis  . HYPERLIPIDEMIA  . HYPERTENSION  . HYPERGLYCEMIA  . CARCINOMA, BASAL CELL, HX OF  . COLONIC POLYPS, HX OF    Past Medical History  Diagnosis Date  . Personal history of other malignant neoplasm of skin     bASAL CELL  . Other and unspecified hyperlipidemia   . Unspecified essential hypertension   . History of colonic polyps     Past Surgical History  Procedure Laterality Date  . Arthroscopy of knee  1989  . Vasectomy    . Esophageal dilation  1998 & 2001&2006    3 times  . Shoulder arthroscopy w/ subacromial decompression and distal clavicle excision  08-27-2007    History   Social History  . Marital Status: Married    Spouse Name: N/A    Number of Children: 2  . Years of Education: N/A   Occupational History  . sales    Social History Main Topics  . Smoking status: Never Smoker   . Smokeless tobacco: Never Used  . Alcohol Use: No  . Drug Use: No  . Sexually Active: Not on file   Other Topics Concern  . Not on file   Social History Narrative  . No narrative on file    Family History  Problem Relation Age of Onset  . Cancer Mother     bladder  . Diabetes Father   . Hyperlipidemia Father   . Coronary artery disease Father   . Cancer Paternal Uncle     brain  . Stroke Maternal Grandfather    . Cancer Paternal Grandfather     lung    No Known Allergies  Medication list has been reviewed and updated.  Outpatient Prescriptions Prior to Visit  Medication Sig Dispense Refill  . enalapril (VASOTEC) 20 MG tablet TAKE TWO TABLETS BY MOUTH EVERY DAY  180 tablet  0  . omeprazole (PRILOSEC) 20 MG capsule TAKE ONE CAPSULE BY MOUTH EVERY DAY  30 capsule  11  . tamsulosin (FLOMAX) 0.4 MG CAPS Take 1 capsule (0.4 mg total) by mouth daily.  30 capsule  3   No facility-administered medications prior to visit.    Review of Systems:   GEN: No acute illnesses, no fevers, chills. GI: No n/v/d, eating normally Pulm: No SOB Interactive and getting along well at home.  Otherwise, ROS is as per the HPI.   Physical Examination: BP 120/80  Pulse 58  Wt 245 lb (111.131 kg)  BMI 30.62 kg/m2  SpO2 98%  Ideal Body Weight:     GEN: WDWN, NAD, Non-toxic, Alert & Oriented x 3 HEENT: Atraumatic, Normocephalic.  Ears and Nose: No external deformity. EXTR: No clubbing/cyanosis/edema NEURO: Normal gait.  PSYCH: Normally interactive. Conversant. Not depressed or anxious appearing.  Calm demeanor.   SKIN: scattered vesicular lesions on the arm as well as some on the upper chest.  Assessment and Plan:  Poison ivy  14 days of prednisone  Orders Today:  No orders of the defined types were placed in this encounter.    Updated Medication List: (Includes new medications, updates to list, dose adjustments) Meds ordered this encounter  Medications  . predniSONE (DELTASONE) 20 MG tablet    Sig: 2 tabs po for 7 days, then 1 tab po for 7 days    Dispense:  21 tablet    Refill:  0    Medications Discontinued: There are no discontinued medications.  e  Signed, Karleen Hampshire T. Kirsta Probert, MD 07/06/2012 3:26 PM

## 2012-10-07 ENCOUNTER — Other Ambulatory Visit: Payer: Self-pay | Admitting: Family Medicine

## 2012-10-25 ENCOUNTER — Ambulatory Visit (INDEPENDENT_AMBULATORY_CARE_PROVIDER_SITE_OTHER): Payer: BC Managed Care – PPO | Admitting: Family Medicine

## 2012-10-25 ENCOUNTER — Encounter: Payer: Self-pay | Admitting: Family Medicine

## 2012-10-25 VITALS — BP 120/60 | HR 64 | Temp 98.0°F | Ht 75.0 in | Wt 239.0 lb

## 2012-10-25 DIAGNOSIS — B372 Candidiasis of skin and nail: Secondary | ICD-10-CM | POA: Insufficient documentation

## 2012-10-25 MED ORDER — NYSTATIN 100000 UNIT/GM EX CREA: TOPICAL_CREAM | Freq: Two times a day (BID) | CUTANEOUS | Status: DC

## 2012-10-25 NOTE — Assessment & Plan Note (Signed)
Treat with topical antifungal. Good hygeine and keep dry as able.

## 2012-10-25 NOTE — Progress Notes (Signed)
  Subjective:    Patient ID: Nicholas Thompson, male    DOB: 06-29-1955, 57 y.o.   MRN: [id number]  HPI  57 year old male presents with rash in groin area in the last  month. Rash is on inner thighs, itchy, red, spreading minimally. He has tried clindamycin gel, neosporin cream, zinc oxide, baby powder.  Has had similar in apst summers... But usually goes away on its own.  No fever. No lesions on penis or testicles.  No abd, no NV.     Review of Systems  Constitutional: Negative for fever and fatigue.  HENT: Negative for congestion.   Eyes: Negative for pain.  Respiratory: Negative for cough.   Cardiovascular: Negative for chest pain.  Gastrointestinal: Negative for abdominal pain.       Objective:   Physical Exam  Constitutional: Vital signs are normal. He appears well-developed and well-nourished.  HENT:  Head: Normocephalic.  Right Ear: Hearing normal.  Left Ear: Hearing normal.  Nose: Nose normal.  Mouth/Throat: Oropharynx is clear and moist and mucous membranes are normal.  Neck: Trachea normal. Carotid bruit is not present. No mass and no thyromegaly present.  Cardiovascular: Normal rate, regular rhythm and normal pulses.  Exam reveals no gallop, no distant heart sounds and no friction rub.   No murmur heard. No peripheral edema  Pulmonary/Chest: Effort normal and breath sounds normal. No respiratory distress.  Skin: Skin is warm, dry and intact. No rash noted.  Rash in  B leg creases,  Beefy red with satellite lesions  Psychiatric: He has a normal mood and affect. His speech is normal and behavior is normal. Thought content normal.          Assessment & Plan:

## 2012-11-07 ENCOUNTER — Encounter: Payer: Self-pay | Admitting: Family Medicine

## 2012-11-07 ENCOUNTER — Ambulatory Visit (INDEPENDENT_AMBULATORY_CARE_PROVIDER_SITE_OTHER): Payer: BC Managed Care – PPO | Admitting: Family Medicine

## 2012-11-07 VITALS — BP 138/90 | HR 66 | Temp 97.8°F | Ht 75.0 in | Wt 240.0 lb

## 2012-11-07 DIAGNOSIS — L247 Irritant contact dermatitis due to plants, except food: Secondary | ICD-10-CM

## 2012-11-07 DIAGNOSIS — L255 Unspecified contact dermatitis due to plants, except food: Secondary | ICD-10-CM | POA: Insufficient documentation

## 2012-11-07 DIAGNOSIS — B372 Candidiasis of skin and nail: Secondary | ICD-10-CM

## 2012-11-07 MED ORDER — FLUCONAZOLE 150 MG PO TABS: 150.0000 mg | ORAL_TABLET | Freq: Once | ORAL | Status: DC

## 2012-11-07 MED ORDER — BETAMETHASONE DIPROPIONATE 0.05 % EX CREA: TOPICAL_CREAM | Freq: Two times a day (BID) | CUTANEOUS | Status: DC

## 2012-11-07 MED ORDER — TRIAMCINOLONE ACETONIDE 40 MG/ML IJ SUSP
40.0000 mg | Freq: Once | INTRAMUSCULAR | Status: AC
  Administered 2012-11-07: 40 mg via INTRAMUSCULAR

## 2012-11-07 MED ORDER — NYSTATIN 100000 UNIT/GM EX CREA: TOPICAL_CREAM | Freq: Two times a day (BID) | CUTANEOUS | Status: DC

## 2012-11-07 NOTE — Patient Instructions (Addendum)
Go to ER if shortness of breath, tounge swelling, difficulty swallowing. Diflucan for intertrigo candidiasis given it is not improving. Apply steroid cream to new rash.  Can use benadryl to help with itching at night and claritin during the day.

## 2012-11-07 NOTE — Assessment & Plan Note (Signed)
Treat with injection of kenalog . Apply topical steroid cream. Go to ER if respiratory compromise.

## 2012-11-07 NOTE — Addendum Note (Signed)
Addended by: Eliezer Bottom on: 11/07/2012 05:10 PM   Modules accepted: Orders

## 2012-11-07 NOTE — Progress Notes (Signed)
  Subjective:    Patient ID: Nicholas Thompson, male    DOB: Aug 17, 1955, 57 y.o.   MRN: [id number]  HPI  57 year old male pt of Dr. Cyndie Chime presents with  new onset rash in last 3 days... On arms mainly and small legs. Very itchy  No rash on face, No dysphagia, no lip or tounge swelling.   Was working outside, cleaning out fig bushes ( rash started the day after). He read on line phytophotodermatitis. Blister and redness.  He has not seen any poison ivy etc. Outside. He typically is not this reactive to poison ivy.) Applying benadryl cream.  Rash in groin from candidal intertrigo is significantly better with nystatin x 2 weeks, but still persistant. Also new are in left axillae involved.    Review of Systems  Constitutional: Negative for fever and fatigue.  HENT: Negative for ear pain.   Eyes: Negative for pain.  Respiratory: Negative for cough and shortness of breath.   Cardiovascular: Negative for chest pain.  Gastrointestinal: Negative for abdominal pain.       Objective:   Physical Exam  Constitutional: Vital signs are normal. He appears well-developed and well-nourished.  HENT:  Head: Normocephalic.  Right Ear: Hearing normal.  Left Ear: Hearing normal.  Nose: Nose normal.  Mouth/Throat: Oropharynx is clear and moist and mucous membranes are normal.  Neck: Trachea normal. Carotid bruit is not present. No mass and no thyromegaly present.  Cardiovascular: Normal rate, regular rhythm and normal pulses.  Exam reveals no gallop, no distant heart sounds and no friction rub.   No murmur heard. No peripheral edema  Pulmonary/Chest: Effort normal and breath sounds normal. No respiratory distress.  Skin: Skin is warm, dry and intact. No rash noted.  Intertrigo in elft axillae and in groin improving  One  Bullae, erythematous patches and excoriations with smaller blisters in linear pattern on B arms, some on legs  Psychiatric: He has a normal mood and affect. His speech is normal  and behavior is normal. Thought content normal.          Assessment & Plan:

## 2012-11-07 NOTE — Assessment & Plan Note (Signed)
Continue nystatin. Add diflucan to treat.

## 2012-11-12 ENCOUNTER — Encounter: Payer: Self-pay | Admitting: Family Medicine

## 2012-11-13 MED ORDER — PREDNISONE 20 MG PO TABS: ORAL_TABLET | ORAL | Status: DC

## 2013-01-11 ENCOUNTER — Ambulatory Visit (INDEPENDENT_AMBULATORY_CARE_PROVIDER_SITE_OTHER): Payer: BC Managed Care – PPO

## 2013-01-11 DIAGNOSIS — Z23 Encounter for immunization: Secondary | ICD-10-CM

## 2013-02-26 ENCOUNTER — Encounter: Payer: Self-pay | Admitting: Family Medicine

## 2013-02-26 ENCOUNTER — Ambulatory Visit (INDEPENDENT_AMBULATORY_CARE_PROVIDER_SITE_OTHER): Payer: BC Managed Care – PPO | Admitting: Family Medicine

## 2013-02-26 VITALS — BP 150/80 | HR 75 | Temp 98.5°F | Ht 75.0 in | Wt 245.5 lb

## 2013-02-26 DIAGNOSIS — F430 Acute stress reaction: Secondary | ICD-10-CM

## 2013-02-26 DIAGNOSIS — G4700 Insomnia, unspecified: Secondary | ICD-10-CM

## 2013-02-26 MED ORDER — CLONAZEPAM 0.5 MG PO TABS: 0.5000 mg | ORAL_TABLET | Freq: Two times a day (BID) | ORAL | Status: DC | PRN

## 2013-02-26 NOTE — Progress Notes (Signed)
Date:  02/26/2013   Name:  Nicholas Thompson   DOB:  09-Mar-1956   MRN:  [id number] Gender: male Age: 57 y.o.  Primary Physician:  Hannah Beat, MD   Chief Complaint: Trouble Sleeping   Subjective:   History of Present Illness:  Nicholas Thompson is a 57 y.o. very pleasant male patient who presents with the following:  F/u 4-5 weeks with Dr. Patsy Lager  Having a lot of stress in the family,trouble concentrating at night. Thought BP was higher. Can't sleep at night. Two weeks with a low grade headache.   Mom and younger brother has disowned him Father died in August 27, 2004.  Talked to pastor last week.   Melatonin 5 mg tablet. Low level headache.  Alleve, Tylenol.   Past Medical History, Surgical History, Social History, Family History, Problem List, Medications, and Allergies have been reviewed and updated if relevant.  Review of Systems:  GEN: No acute illnesses, no fevers, chills. GI: No n/v/d, eating normally Pulm: No SOB Interactive and getting along well at home.  Otherwise, ROS is as per the HPI.  Objective:   Physical Examination: BP 150/80  Pulse 75  Temp(Src) 98.5 F (36.9 C) (Oral)  Ht 6\' 3"  (1.905 m)  Wt 245 lb 8 oz (111.358 kg)  BMI 30.69 kg/m2   GEN: WDWN, NAD, Non-toxic, Alert & Oriented x 3 HEENT: Atraumatic, Normocephalic.  Ears and Nose: No external deformity. EXTR: No clubbing/cyanosis/edema NEURO: Normal gait.  PSYCH: Normally interactive. Conversant. Not depressed or anxious appearing.  Calm demeanor.   Laboratory and Imaging Data:  Assessment & Plan:    Stress reaction  Insomnia  >25 minutes spent in face to face time with patient, >50% spent in counselling or coordination of care: all spent in counselling. As above. Family stress, psychosocial, poor sleep. Having a hard time dealing with this. Insomnia. No crying, no anxiety attacks. Klonopin prn for now, close f/u  Patient Instructions  Insomnia:  Melatonin 3-5 mg can be taken 1 hour before  sleep very safely every day (YOU CAN TAKE UP TO 10 MG a day)  Antihistamines: 2 tabs of Benadryl is OK Can also take 2 Dramamine (get the older version that can cause drowsiness) Or Unisom (doxylamine)    Orders Today:  No orders of the defined types were placed in this encounter.    New medications, updates to list, dose adjustments: Meds ordered this encounter  Medications  . nystatin cream (MYCOSTATIN)    Sig: Use for 48 hours after rash appears as needed  . clonazePAM (KLONOPIN) 0.5 MG tablet    Sig: Take 1 tablet (0.5 mg total) by mouth 2 (two) times daily as needed for anxiety.    Dispense:  60 tablet    Refill:  1    Signed,  Lillith Mcneff T. Retal Tonkinson, MD, CAQ Sports Medicine  [facility] at [facility] [address] [place] [address] Phone: [phone] Fax: [phone]  Updated Complete Medication List:   Medication List       This list is accurate as of: 02/26/13  2:06 PM.  Always use your most recent med list.               clonazePAM 0.5 MG tablet  Commonly known as:  KLONOPIN  Take 1 tablet (0.5 mg total) by mouth 2 (two) times daily as needed for anxiety.     enalapril 20 MG tablet  Commonly known as:  VASOTEC  TAKE TWO TABLETS BY MOUTH ONCE  DAILY     nystatin cream  Commonly known as:  MYCOSTATIN  Use for 48 hours after rash appears as needed     omeprazole 20 MG capsule  Commonly known as:  PRILOSEC  TAKE ONE CAPSULE BY MOUTH EVERY DAY

## 2013-02-26 NOTE — Patient Instructions (Signed)
Insomnia:  Melatonin 3-5 mg can be taken 1 hour before sleep very safely every day (YOU CAN TAKE UP TO 10 MG a day)  Antihistamines: 2 tabs of Benadryl is OK Can also take 2 Dramamine (get the older version that can cause drowsiness) Or Unisom (doxylamine)

## 2013-02-26 NOTE — Progress Notes (Signed)
Pre-visit discussion using our clinic review tool. No additional management support is needed unless otherwise documented below in the visit note.  

## 2013-04-04 ENCOUNTER — Other Ambulatory Visit: Payer: Self-pay | Admitting: Family Medicine

## 2013-05-06 ENCOUNTER — Other Ambulatory Visit: Payer: Self-pay | Admitting: Family Medicine

## 2013-06-25 ENCOUNTER — Other Ambulatory Visit (INDEPENDENT_AMBULATORY_CARE_PROVIDER_SITE_OTHER): Payer: BC Managed Care – PPO

## 2013-06-25 DIAGNOSIS — Z125 Encounter for screening for malignant neoplasm of prostate: Secondary | ICD-10-CM

## 2013-06-25 DIAGNOSIS — E785 Hyperlipidemia, unspecified: Secondary | ICD-10-CM

## 2013-06-25 DIAGNOSIS — Z79899 Other long term (current) drug therapy: Secondary | ICD-10-CM

## 2013-06-25 LAB — CBC WITH DIFFERENTIAL/PLATELET
BASOS PCT: 0.5 % (ref 0.0–3.0)
Basophils Absolute: 0 10*3/uL (ref 0.0–0.1)
EOS PCT: 3.1 % (ref 0.0–5.0)
Eosinophils Absolute: 0.2 10*3/uL (ref 0.0–0.7)
HCT: 45.6 % (ref 39.0–52.0)
Hemoglobin: 15.7 g/dL (ref 13.0–17.0)
LYMPHS PCT: 23.7 % (ref 12.0–46.0)
Lymphs Abs: 1.8 10*3/uL (ref 0.7–4.0)
MCHC: 34.4 g/dL (ref 30.0–36.0)
MCV: 90.7 fl (ref 78.0–100.0)
MONO ABS: 0.5 10*3/uL (ref 0.1–1.0)
MONOS PCT: 7.2 % (ref 3.0–12.0)
NEUTROS PCT: 65.5 % (ref 43.0–77.0)
Neutro Abs: 5 10*3/uL (ref 1.4–7.7)
PLATELETS: 246 10*3/uL (ref 150.0–400.0)
RBC: 5.03 Mil/uL (ref 4.22–5.81)
RDW: 12.5 % (ref 11.5–14.6)
WBC: 7.7 10*3/uL (ref 4.5–10.5)

## 2013-06-25 LAB — BASIC METABOLIC PANEL
BUN: 16 mg/dL (ref 6–23)
CHLORIDE: 107 meq/L (ref 96–112)
CO2: 28 meq/L (ref 19–32)
CREATININE: 0.8 mg/dL (ref 0.4–1.5)
Calcium: 8.9 mg/dL (ref 8.4–10.5)
GFR: 103.74 mL/min (ref >60.00)
GLUCOSE: 106 mg/dL — AB (ref 70–99)
Potassium: 4.6 mEq/L (ref 3.5–5.1)
Sodium: 142 mEq/L (ref 135–145)

## 2013-06-25 LAB — LIPID PANEL
CHOL/HDL RATIO: 4
Cholesterol: 176 mg/dL (ref 0–200)
HDL: 47.1 mg/dL (ref >39.00)
LDL CALC: 117 mg/dL — AB (ref 0–99)
Triglycerides: 61 mg/dL (ref 0.0–149.0)
VLDL: 12.2 mg/dL (ref 0.0–40.0)

## 2013-06-25 LAB — HEPATIC FUNCTION PANEL
ALK PHOS: 67 U/L (ref 39–117)
ALT: 17 U/L (ref 0–53)
AST: 17 U/L (ref 0–37)
Albumin: 3.7 g/dL (ref 3.5–5.2)
BILIRUBIN DIRECT: 0.1 mg/dL (ref 0.0–0.3)
BILIRUBIN TOTAL: 0.6 mg/dL (ref 0.3–1.2)
TOTAL PROTEIN: 6.3 g/dL (ref 6.0–8.3)

## 2013-06-25 LAB — PSA: PSA: 1.44 ng/mL (ref 0.10–4.00)

## 2013-07-02 ENCOUNTER — Ambulatory Visit (INDEPENDENT_AMBULATORY_CARE_PROVIDER_SITE_OTHER): Payer: BC Managed Care – PPO | Admitting: Family Medicine

## 2013-07-02 ENCOUNTER — Encounter: Payer: Self-pay | Admitting: Family Medicine

## 2013-07-02 VITALS — BP 150/90 | HR 75 | Temp 98.2°F | Ht 71.75 in | Wt 242.2 lb

## 2013-07-02 DIAGNOSIS — Z0000 Encounter for general adult medical examination without abnormal findings: Secondary | ICD-10-CM

## 2013-07-02 NOTE — Progress Notes (Signed)
Date:  07/02/2013   Name:  Nicholas Thompson   DOB:  [birth year]   MRN:  [id number] Gender: male Age: 58 y.o.  Primary Physician:  Owens Loffler, MD   Chief Complaint: Annual Exam   Subjective:   History of Present Illness:  Nicholas Thompson is a 58 y.o. pleasant patient who presents with the following:  Preventative Health Maintenance Visit:  Health Maintenance Summary Reviewed and updated, unless pt declines services.  Tobacco History Reviewed. Alcohol: No concerns, no excessive use Exercise Habits: Some activity, rec at least 30 mins 5 times a week STD concerns: no risk or activity to increase risk Drug Use: None Encouraged self-testicular check  Health Maintenance  Topic Date Due  . Influenza Vaccine  10/20/2013  . Colonoscopy  09/19/2014  . Tetanus/tdap  05/25/2021    Immunization History  Administered Date(s) Administered  . Influenza,inj,Quad PF,36+ Mos 01/11/2013  . Td 09/12/1998  . Tdap 05/26/2011    Patient Active Problem List   Diagnosis Date Noted  . COLONIC POLYPS, HX OF 08/07/2008  . HYPERLIPIDEMIA 05/18/2007  . HYPERTENSION 05/18/2007  . HYPERGLYCEMIA 05/18/2007  . CARCINOMA, BASAL CELL, HX OF 05/18/2007    Past Medical History  Diagnosis Date  . Personal history of other malignant neoplasm of skin     bASAL CELL  . Other and unspecified hyperlipidemia   . Unspecified essential hypertension   . History of colonic polyps     Past Surgical History  Procedure Laterality Date  . Arthroscopy of knee  1989  . Vasectomy    . Esophageal dilation  1998 & 2001&2006    3 times  . Shoulder arthroscopy w/ subacromial decompression and distal clavicle excision  08-27-2007    History   Social History  . Marital Status: Married    Spouse Name: N/A    Number of Children: 2  . Years of Education: N/A   Occupational History  . sales    Social History Main Topics  . Smoking status: Never Smoker   . Smokeless tobacco: Never Used  . Alcohol Use: No  .  Drug Use: No  . Sexual Activity: Not on file   Other Topics Concern  . Not on file   Social History Narrative  . No narrative on file    Family History  Problem Relation Age of Onset  . Cancer Mother     bladder  . Diabetes Father   . Hyperlipidemia Father   . Coronary artery disease Father   . Cancer Paternal Uncle     brain  . Stroke Maternal Grandfather   . Cancer Paternal Grandfather     lung    No Known Allergies  Medication list has been reviewed and updated.  Review of Systems:  General: Denies fever, chills, sweats. No significant weight loss. Eyes: Denies blurring,significant itching ENT: Denies earache, sore throat, and hoarseness. Cardiovascular: Denies chest pains, palpitations, dyspnea on exertion Respiratory: Denies cough, dyspnea at rest,wheeezing Breast: no concerns about lumps GI: Denies nausea, vomiting, diarrhea, constipation, change in bowel habits, abdominal pain, melena, hematochezia GU: Denies penile discharge, ED, urinary flow / outflow problems. No STD concerns. Musculoskeletal: Denies back pain, joint pain Derm: Denies rash, itching Neuro: Denies  paresthesias, frequent falls, frequent headaches Psych: Denies depression, anxiety. Some stress at home per prior OV Endocrine: Denies cold intolerance, heat intolerance, polydipsia Heme: Denies enlarged lymph nodes Allergy: No hayfever  Objective:   Physical Examination: BP 150/90  Pulse 75  Temp(Src) 98.2 F (  36.8 C) (Oral)  Ht 5' 11.75" (1.822 m)  Wt 242 lb 4 oz (109.884 kg)  BMI 33.10 kg/m2 Ideal Body Weight: Weight in (lb) to have BMI = 25: 182.7  GEN: well developed, well nourished, no acute distress Eyes: conjunctiva and lids normal, PERRLA, EOMI ENT: TM clear, nares clear, oral exam WNL Neck: supple, no lymphadenopathy, no thyromegaly, no JVD Pulm: clear to auscultation and percussion, respiratory effort normal CV: regular rate and rhythm, S1-S2, no murmur, rub or gallop, no  bruits, peripheral pulses normal and symmetric, no cyanosis, clubbing, edema or varicosities GI: soft, non-tender; no hepatosplenomegaly, masses; active bowel sounds all quadrants GU: no hernia, testicular mass, penile discharge Lymph: no cervical, axillary or inguinal adenopathy MSK: gait normal, muscle tone and strength WNL, no joint swelling, effusions, discoloration, crepitus  SKIN: clear, good turgor, color WNL, no rashes, lesions, or ulcerations Neuro: normal mental status, normal strength, sensation, and motion Psych: alert; oriented to person, place and time, normally interactive and not anxious or depressed in appearance.  All labs reviewed with patient.  Lipids:    Component Value Date/Time   CHOL 176 06/25/2013 0906   TRIG 61.0 06/25/2013 0906   HDL 47.10 06/25/2013 0906   VLDL 12.2 06/25/2013 0906   CHOLHDL 4 06/25/2013 0906    CBC:    Component Value Date/Time   WBC 7.7 06/25/2013 0906   HGB 15.7 06/25/2013 0906   HCT 45.6 06/25/2013 0906   PLT 246.0 06/25/2013 0906   MCV 90.7 06/25/2013 0906   NEUTROABS 5.0 06/25/2013 0906   LYMPHSABS 1.8 06/25/2013 0906   MONOABS 0.5 06/25/2013 0906   EOSABS 0.2 06/25/2013 0906   BASOSABS 0.0 06/25/2013 9563    Basic Metabolic Panel:    Component Value Date/Time   NA 142 06/25/2013 0906   K 4.6 06/25/2013 0906   CL 107 06/25/2013 0906   CO2 28 06/25/2013 0906   BUN 16 06/25/2013 0906   CREATININE 0.8 06/25/2013 0906   GLUCOSE 106* 06/25/2013 0906   CALCIUM 8.9 06/25/2013 0906    Lab Results  Component Value Date   ALT 17 06/25/2013   AST 17 06/25/2013   ALKPHOS 67 06/25/2013   BILITOT 0.6 06/25/2013    Lab Results  Component Value Date   TSH 0.46 09/03/2005    Lab Results  Component Value Date   PSA 1.44 06/25/2013   PSA 1.40 06/26/2012   PSA 1.70 05/26/2011    Assessment & Plan:   Health Maintenance Exam: The patient's preventative maintenance and recommended screening tests for an annual wellness exam were reviewed in full today. Brought up to date unless  services declined.  Counselled on the importance of diet, exercise, and its role in overall health and mortality. The patient's FH and SH was reviewed, including their home life, tobacco status, and drug and alcohol status.  Routine general medical examination at a health care facility  Family stress ongoing.  Scout forms completed  Signed,  Frederico Hamman T. Lula Kolton, MD, [facility] [place] [address] Phone: [phone] Fax: [phone]  Patient's Medications  New Prescriptions   No medications on file  Previous Medications   CLONAZEPAM (KLONOPIN) 0.5 MG TABLET    Take 1 tablet (0.5 mg total) by mouth 2 (two) times daily as needed for anxiety.   ENALAPRIL (VASOTEC) 20 MG TABLET    TAKE TWO TABLETS BY MOUTH ONCE DAILY   MULTIPLE VITAMINS-MINERALS (MULTIVITAMIN WITH MINERALS) TABLET  Take 1 tablet by mouth daily.   NYSTATIN CREAM (MYCOSTATIN)    Use for 48 hours after rash appears as needed   OMEPRAZOLE (PRILOSEC) 20 MG CAPSULE    TAKE ONE CAPSULE BY MOUTH ONCE DAILY  Modified Medications   No medications on file  Discontinued Medications   No medications on file

## 2013-07-02 NOTE — Progress Notes (Signed)
Pre visit review using our clinic review tool, if applicable. No additional management support is needed unless otherwise documented below in the visit note. 

## 2013-09-27 ENCOUNTER — Other Ambulatory Visit: Payer: Self-pay | Admitting: Family Medicine

## 2013-11-28 ENCOUNTER — Other Ambulatory Visit: Payer: Self-pay | Admitting: *Deleted

## 2013-11-28 MED ORDER — ENALAPRIL MALEATE 20 MG PO TABS: ORAL_TABLET | ORAL | Status: DC

## 2014-01-25 ENCOUNTER — Ambulatory Visit (INDEPENDENT_AMBULATORY_CARE_PROVIDER_SITE_OTHER): Payer: BC Managed Care – PPO | Admitting: Family Medicine

## 2014-01-25 ENCOUNTER — Encounter: Payer: Self-pay | Admitting: Family Medicine

## 2014-01-25 VITALS — BP 130/72 | HR 62 | Temp 98.5°F | Ht 71.75 in | Wt 241.2 lb

## 2014-01-25 DIAGNOSIS — J209 Acute bronchitis, unspecified: Secondary | ICD-10-CM

## 2014-01-25 MED ORDER — GUAIFENESIN-CODEINE 100-10 MG/5ML PO SYRP: 5.0000 mL | ORAL_SOLUTION | Freq: Every evening | ORAL | Status: DC | PRN

## 2014-01-25 MED ORDER — AZITHROMYCIN 250 MG PO TABS: ORAL_TABLET | ORAL | Status: DC

## 2014-01-25 NOTE — Assessment & Plan Note (Signed)
Given > 2 weeks of symptoms worsening, will treat with antibiotics. Symptomatic care with cough suppressant rx. Fluids and rest.

## 2014-01-25 NOTE — Progress Notes (Signed)
Pre visit review using our clinic review tool, if applicable. No additional management support is needed unless otherwise documented below in the visit note. 

## 2014-01-25 NOTE — Patient Instructions (Signed)
Compelte antibiotics. Symptomatic care with cough suppressant at night. Fluids and rest. During the day use mucinex DM.

## 2014-01-25 NOTE — Progress Notes (Signed)
   Subjective:    Patient ID: MASAJI BILLUPS, male    DOB: [birth year], 58 y.o.   MRN: [id number]  HPI   58 year old male pt of Dr. Lillie Fragmin presents with  2 weeks of intermittent nasal congestion, chest congestion, frequent cough. Dry cough,  Has some mild shortness of breath. No wheeze.  Diarrhea off and on.  No V, but nausea from mucus drainage. Keeping down fluids. He has noted low grade temp 99.72F sweats.   He has treated with OTC generic mucinex, helped minimmally.  Cough keeping him up at night.   No personal history of smoking, no COPD, asthma, no chronic lung issues.     Review of Systems  Constitutional: Positive for fever and fatigue.  HENT: Negative for ear discharge and ear pain.   Eyes: Negative for pain.  Respiratory: Positive for cough and shortness of breath. Negative for wheezing.   Cardiovascular: Negative for chest pain and leg swelling.       Objective:   Physical Exam  Constitutional: Vital signs are normal. He appears well-developed and well-nourished.  Non-toxic appearance. He does not appear ill. No distress.  HENT:  Head: Normocephalic and atraumatic.  Right Ear: Hearing, tympanic membrane, external ear and ear canal normal. No tenderness. No foreign bodies. Tympanic membrane is not retracted and not bulging.  Left Ear: Hearing, tympanic membrane, external ear and ear canal normal. No tenderness. No foreign bodies. Tympanic membrane is not retracted and not bulging.  Nose: Nose normal. No mucosal edema or rhinorrhea. Right sinus exhibits no maxillary sinus tenderness and no frontal sinus tenderness. Left sinus exhibits no maxillary sinus tenderness and no frontal sinus tenderness.  Mouth/Throat: Uvula is midline, oropharynx is clear and moist and mucous membranes are normal. Normal dentition. No dental caries. No oropharyngeal exudate or tonsillar abscesses.  Eyes: Conjunctivae, EOM and lids are normal. Pupils are equal, round, and reactive to light. Lids  are everted and swept, no foreign bodies found.  Neck: Trachea normal, normal range of motion and phonation normal. Neck supple. Carotid bruit is not present. No thyroid mass and no thyromegaly present.  Cardiovascular: Normal rate, regular rhythm, S1 normal, S2 normal, normal heart sounds, intact distal pulses and normal pulses.  Exam reveals no gallop.   No murmur heard. Pulmonary/Chest: Effort normal. No respiratory distress. He has wheezes. He has no rhonchi. He has no rales.  Scattered wheeze  Abdominal: Soft. Normal appearance and bowel sounds are normal. There is no hepatosplenomegaly. There is no tenderness. There is no rebound, no guarding and no CVA tenderness. No hernia.  Neurological: He is alert. He has normal reflexes.  Skin: Skin is warm, dry and intact. No rash noted.  Psychiatric: He has a normal mood and affect. His speech is normal and behavior is normal. Judgment normal.          Assessment & Plan:

## 2014-03-25 ENCOUNTER — Encounter: Payer: Self-pay | Admitting: Family Medicine

## 2014-03-25 ENCOUNTER — Ambulatory Visit (INDEPENDENT_AMBULATORY_CARE_PROVIDER_SITE_OTHER): Payer: BLUE CROSS/BLUE SHIELD | Admitting: Family Medicine

## 2014-03-25 ENCOUNTER — Other Ambulatory Visit: Payer: Self-pay | Admitting: Family Medicine

## 2014-03-25 ENCOUNTER — Ambulatory Visit (INDEPENDENT_AMBULATORY_CARE_PROVIDER_SITE_OTHER)
Admission: RE | Admit: 2014-03-25 | Discharge: 2014-03-25 | Disposition: A | Discharge status: Home / Self Care | Payer: BLUE CROSS/BLUE SHIELD | Source: Ambulatory Visit | Attending: Family Medicine | Admitting: Family Medicine

## 2014-03-25 VITALS — BP 140/72 | HR 73 | Temp 97.5°F | Ht 71.75 in | Wt 245.5 lb

## 2014-03-25 DIAGNOSIS — M25561 Pain in right knee: Secondary | ICD-10-CM

## 2014-03-25 DIAGNOSIS — M25461 Effusion, right knee: Secondary | ICD-10-CM

## 2014-03-25 MED ORDER — METHYLPREDNISOLONE ACETATE 40 MG/ML IJ SUSP
80.0000 mg | Freq: Once | INTRAMUSCULAR | Status: AC
  Administered 2014-03-25: 80 mg via INTRA_ARTICULAR

## 2014-03-25 MED ORDER — INDOMETHACIN 50 MG PO CAPS
50.0000 mg | ORAL_CAPSULE | Freq: Three times a day (TID) | ORAL | Status: DC
Start: 2014-03-25 — End: 2015-03-20

## 2014-03-25 NOTE — Progress Notes (Signed)
Pre visit review using our clinic review tool, if applicable. No additional management support is needed unless otherwise documented below in the visit note. 

## 2014-03-25 NOTE — Progress Notes (Signed)
Dr. Frederico Hamman T. Darlin Stenseth, MD, [facility] [place], [address] Phone: [phone] Fax: [phone]  03/25/2014  Patient: Nicholas Thompson, MRN: [id number], DOB: 04-04-1955, 59 y.o.  Primary Physician:  Owens Loffler, MD  Chief Complaint: Knee Pain  Subjective:   Nicholas Thompson is a 59 y.o. very pleasant male patient who presents with the following:  Pleasant 59 year old male who is very well-known who presents with a right-sided knee effusion and acute pain that started on Saturday.  He had an acute onset of pain and he is been having difficulty walking.  He has been using a walker or a cane at home.  Last week he was having some pain in the knee and he started to wear a brace, which he thought did help somewhat.  There is no redness or warmth in the joint.  He does have a history of gout approximately 20 years ago per his memory.  He has not had any trauma or injury to the knee that he can recall.  He has tried taking some Aleve, 2 tablets in the morning, which did not seem to help all that much.  Past Medical History, Surgical History, Social History, Family History, Problem List, Medications, and Allergies have been reviewed and updated if relevant.  GEN: No fevers, chills. Nontoxic. Primarily MSK c/o today. MSK: Detailed in the HPI GI: tolerating PO intake without difficulty Neuro: No numbness, parasthesias, or tingling associated. Otherwise the pertinent positives of the ROS are noted above.   Objective:   BP 140/72 mmHg  Pulse 73  Temp(Src) 97.5 F (36.4 C) (Oral)  Ht 5' 11.75" (1.822 m)  Wt 245 lb 8 oz (111.358 kg)  BMI 33.54 kg/m2   GEN: WDWN, NAD, Non-toxic, Alert & Oriented x 3 HEENT: Atraumatic, Normocephalic.  Ears and Nose: No external deformity. EXTR: No clubbing/cyanosis/edema PSYCH: Normally interactive. Conversant. Not depressed or anxious appearing.  Calm demeanor.   R knee: the patient lacks  approximately 7-8  Degrees of extension.  He is able to flex his knee to about 90 or 95.  There is a moderate effusion.  There is no redness or warmth surrounding or around his knee.  Skin is intact.  Moderate tenderness on the medial lateral joint lines.  Stable MCL and LCL.  ACL and PCL appeared intact.  Cannot complete additional special testing secondary to pain and effusion.  Radiology: Dg Knee Complete 4 Views Right  03/25/2014   CLINICAL DATA:  Right knee pain  EXAM: RIGHT KNEE - COMPLETE 4+ VIEW  COMPARISON:  None.  FINDINGS: The bones of the knee are adequately mineralized. There is no lytic or blastic lesion nor evidence of an acute fracture. There is beaking of the tibial spines. There is calcinosis of the menisci bilaterally. There is a small spur from the periphery of the medial tibial condyles. There is a spur from the inferior articular margin of the patella.  IMPRESSION: There is mild to moderate degenerative change of all 3 joint compartments of the right knee. There is no acute bony abnormality.   Electronically Signed   By: David  Martinique   On: 03/25/2014 17:31    Results for orders placed or performed in visit on 03/25/14  Synovial cell count + diff, w/ crystals  Result Value Ref Range   Color, Synovial YELLOW YELLOW   Appearance-Synovial CLOUDY (A) CLEAR   WBC, Synovial 12300 (H) 0 - 200 cu mm  Neutrophil, Synovial 94 (H) 0 - 25 %   Lymphocytes-Synovial Fld 2 0 - 20 %   Monocyte/Macrophage 4 (L) 50 - 90 %   Eosinophils-Synovial 0 0 - 1 %   Crystals, Fluid No crystals seen     Gram Stain FewP   Gram Stain WBC present-both PMN and MononuclearP   Gram Stain No Organisms SeenP     Assessment and Plan:   Right knee pain - Plan: DG Knee Complete 4 Views Right, Synovial cell count + diff, w/ crystals, methylPREDNISolone acetate (DEPO-MEDROL) injection 80 mg  Knee effusion, right - Plan: Synovial cell count + diff, w/ crystals, methylPREDNISolone acetate (DEPO-MEDROL)  injection 80 mg  Initially presumed gout or CPPD, but no crystals in fluid. No organisms on gram stain.  Rheum or reactive possible, but no history. Certainly has some OA  Indocin TID still reasonable in this case.  Knee Aspiration and Injection, R Patient verbally consented; risks, benefits, and alternatives explained including possible infection. Patient prepped with Chloraprep. Ethyl chloride for anesthesia. 10 cc of 1% Lidocaine used in wheal then injected Subcutaneous fashion with 22 gauge needle on lateral approach. Under sterilne conditions, 18 gauge needle used via lateral approach to aspirate 25 cc of yellow, cloudy fluid, no blood. Then 8 cc of Lidocaine 1% and Depo-Medrol 80 mg injected. Tolerated well, decreased pain, no complications.   Follow-up: call if worsens at all, call Wednesday if not significantly better.   New Prescriptions   INDOMETHACIN (INDOCIN) 50 MG CAPSULE    Take 1 capsule (50 mg total) by mouth 3 (three) times daily with meals.   Orders Placed This Encounter  Procedures  . DG Knee Complete 4 Views Right  . Synovial cell count + diff, w/ crystals    Signed,  Jelina Paulsen T. Amai Cappiello, MD   Patient's Medications  New Prescriptions   INDOMETHACIN (INDOCIN) 50 MG CAPSULE    Take 1 capsule (50 mg total) by mouth 3 (three) times daily with meals.  Previous Medications   ENALAPRIL (VASOTEC) 20 MG TABLET    TAKE TWO TABLETS BY MOUTH ONCE DAILY   MULTIPLE VITAMINS-MINERALS (MULTIVITAMIN WITH MINERALS) TABLET    Take 1 tablet by mouth daily.   OMEPRAZOLE (PRILOSEC) 20 MG CAPSULE    TAKE ONE CAPSULE BY MOUTH ONCE DAILY  Modified Medications   No medications on file  Discontinued Medications   AZITHROMYCIN (ZITHROMAX) 250 MG TABLET    2 tab po x 1 day then 1 tab po daily   GUAIFENESIN-CODEINE (ROBITUSSIN AC) 100-10 MG/5ML SYRUP    Take 5-10 mLs by mouth at bedtime as needed for cough.   NYSTATIN CREAM (MYCOSTATIN)    Use for 48 hours after rash appears as needed

## 2014-03-26 ENCOUNTER — Telehealth: Payer: Self-pay | Admitting: *Deleted

## 2014-03-26 LAB — SYNOVIAL CELL COUNT + DIFF, W/ CRYSTALS
Crystals, Fluid: NONE SEEN
Eosinophils-Synovial: 0 % (ref 0–1)
Lymphocytes-Synovial Fld: 2 % (ref 0–20)
Monocyte/Macrophage: 4 % — ABNORMAL LOW (ref 50–90)
Neutrophil, Synovial: 94 % — ABNORMAL HIGH (ref 0–25)
WBC, SYNOVIAL: 12300 uL — AB (ref 0–200)

## 2014-03-26 NOTE — Telephone Encounter (Signed)
Received stat preliminary results from [facility] lab on gram stain. Results already in [place], and were addressed by MD yesterday. Pending results of body fluid culture.

## 2014-03-29 ENCOUNTER — Other Ambulatory Visit: Payer: Self-pay | Admitting: Family Medicine

## 2014-03-29 LAB — BODY FLUID CULTURE
Gram Stain: NONE SEEN
ORGANISM ID, BACTERIA: NO GROWTH

## 2014-06-17 ENCOUNTER — Encounter: Payer: Self-pay | Admitting: Family Medicine

## 2014-06-17 ENCOUNTER — Other Ambulatory Visit: Payer: Self-pay | Admitting: *Deleted

## 2014-06-17 MED ORDER — ENALAPRIL MALEATE 20 MG PO TABS: ORAL_TABLET | ORAL | Status: DC

## 2014-06-17 NOTE — Telephone Encounter (Signed)
Last office visit 03/25/2014 for knee pain.  Last CPE 07/02/2013.  No future appointments scheduled.  Ok to refill?

## 2014-09-26 ENCOUNTER — Other Ambulatory Visit: Payer: Self-pay | Admitting: Family Medicine

## 2014-09-26 NOTE — Telephone Encounter (Signed)
Please call and schedule CPE with fasting labs with Dr. Lorelei Pont.

## 2014-12-24 ENCOUNTER — Other Ambulatory Visit: Payer: Self-pay | Admitting: Family Medicine

## 2014-12-24 NOTE — Telephone Encounter (Signed)
Please call and schedule CPE with fasting labs prior for Dr. Copland.  

## 2015-01-19 ENCOUNTER — Other Ambulatory Visit: Payer: Self-pay | Admitting: Family Medicine

## 2015-01-20 NOTE — Telephone Encounter (Signed)
Please scheduled CPE with fasting labs prior for Dr. Lorelei Pont.

## 2015-01-28 ENCOUNTER — Other Ambulatory Visit (INDEPENDENT_AMBULATORY_CARE_PROVIDER_SITE_OTHER): Payer: BLUE CROSS/BLUE SHIELD

## 2015-01-28 DIAGNOSIS — Z125 Encounter for screening for malignant neoplasm of prostate: Secondary | ICD-10-CM | POA: Diagnosis not present

## 2015-01-28 DIAGNOSIS — Z79899 Other long term (current) drug therapy: Secondary | ICD-10-CM

## 2015-01-28 DIAGNOSIS — E785 Hyperlipidemia, unspecified: Secondary | ICD-10-CM

## 2015-01-28 DIAGNOSIS — R739 Hyperglycemia, unspecified: Secondary | ICD-10-CM | POA: Diagnosis not present

## 2015-01-28 LAB — HEMOGLOBIN A1C: HEMOGLOBIN A1C: 5.2 % (ref 4.6–6.5)

## 2015-01-28 LAB — BASIC METABOLIC PANEL
BUN: 12 mg/dL (ref 6–23)
CO2: 30 meq/L (ref 19–32)
CREATININE: 0.72 mg/dL (ref 0.40–1.50)
Calcium: 9.4 mg/dL (ref 8.4–10.5)
Chloride: 106 mEq/L (ref 96–112)
GFR: 118.21 mL/min (ref >60.00)
GLUCOSE: 109 mg/dL — AB (ref 70–99)
Potassium: 4.5 mEq/L (ref 3.5–5.1)
SODIUM: 142 meq/L (ref 135–145)

## 2015-01-28 LAB — HEPATIC FUNCTION PANEL
ALK PHOS: 74 U/L (ref 39–117)
ALT: 20 U/L (ref 0–53)
AST: 17 U/L (ref 0–37)
Albumin: 4.1 g/dL (ref 3.5–5.2)
BILIRUBIN DIRECT: 0.2 mg/dL (ref 0.0–0.3)
BILIRUBIN TOTAL: 0.9 mg/dL (ref 0.2–1.2)
Total Protein: 6.5 g/dL (ref 6.0–8.3)

## 2015-01-28 LAB — LIPID PANEL
CHOL/HDL RATIO: 4
Cholesterol: 200 mg/dL (ref 0–200)
HDL: 55.1 mg/dL (ref >39.00)
LDL CALC: 128 mg/dL — AB (ref 0–99)
NonHDL: 144.73
Triglycerides: 83 mg/dL (ref 0.0–149.0)
VLDL: 16.6 mg/dL (ref 0.0–40.0)

## 2015-01-28 LAB — CBC WITH DIFFERENTIAL/PLATELET
BASOS ABS: 0.1 10*3/uL (ref 0.0–0.1)
Basophils Relative: 0.7 % (ref 0.0–3.0)
EOS ABS: 0.2 10*3/uL (ref 0.0–0.7)
Eosinophils Relative: 3.3 % (ref 0.0–5.0)
HEMATOCRIT: 48.8 % (ref 39.0–52.0)
Hemoglobin: 16.3 g/dL (ref 13.0–17.0)
LYMPHS PCT: 27.3 % (ref 12.0–46.0)
Lymphs Abs: 1.9 10*3/uL (ref 0.7–4.0)
MCHC: 33.4 g/dL (ref 30.0–36.0)
MCV: 91.8 fl (ref 78.0–100.0)
Monocytes Absolute: 0.5 10*3/uL (ref 0.1–1.0)
Monocytes Relative: 6.4 % (ref 3.0–12.0)
NEUTROS ABS: 4.4 10*3/uL (ref 1.4–7.7)
NEUTROS PCT: 62.3 % (ref 43.0–77.0)
PLATELETS: 258 10*3/uL (ref 150.0–400.0)
RBC: 5.32 Mil/uL (ref 4.22–5.81)
RDW: 12.9 % (ref 11.5–15.5)
WBC: 7.1 10*3/uL (ref 4.0–10.5)

## 2015-01-28 LAB — TSH: TSH: 0.34 u[IU]/mL — AB (ref 0.35–4.50)

## 2015-01-28 LAB — PSA: PSA: 1.21 ng/mL (ref 0.10–4.00)

## 2015-01-30 ENCOUNTER — Ambulatory Visit (INDEPENDENT_AMBULATORY_CARE_PROVIDER_SITE_OTHER): Payer: BLUE CROSS/BLUE SHIELD | Admitting: Family Medicine

## 2015-01-30 ENCOUNTER — Encounter: Payer: Self-pay | Admitting: Family Medicine

## 2015-01-30 VITALS — BP 142/77 | HR 59 | Temp 98.5°F | Ht 72.0 in | Wt 247.0 lb

## 2015-01-30 DIAGNOSIS — R7989 Other specified abnormal findings of blood chemistry: Secondary | ICD-10-CM

## 2015-01-30 DIAGNOSIS — R946 Abnormal results of thyroid function studies: Secondary | ICD-10-CM | POA: Diagnosis not present

## 2015-01-30 DIAGNOSIS — Z0000 Encounter for general adult medical examination without abnormal findings: Secondary | ICD-10-CM | POA: Diagnosis not present

## 2015-01-30 DIAGNOSIS — Z23 Encounter for immunization: Secondary | ICD-10-CM

## 2015-01-30 NOTE — Progress Notes (Signed)
Pre visit review using our clinic review tool, if applicable. No additional management support is needed unless otherwise documented below in the visit note. 

## 2015-01-30 NOTE — Progress Notes (Signed)
Dr. Frederico Hamman T. Anastacio Bua, MD, [facility] [place], [address] Phone: [phone] Fax: [phone]  01/30/2015  Patient: Nicholas Thompson, MRN: [id number], DOB: [birth year], 59 y.o.  Primary Physician:  Owens Loffler, MD   Chief Complaint  Patient presents with  . Annual Exam   Subjective:   Nicholas Thompson is a 59 y.o. pleasant patient who presents with the following:  Preventative Health Maintenance Visit:  Health Maintenance Summary Reviewed and updated, unless pt declines services.  Tobacco History Reviewed. Alcohol: No concerns, no excessive use Exercise Habits: Some activity, rec at least 30 mins 5 times a week STD concerns: no risk or activity to increase risk Drug Use: None Encouraged self-testicular check  Dr. Phillip Heal in [place].  Wife, complicated health history.  Multiple skin cancers this year.   Oral health, multiple fillings, oral surgery  Partial on the way.    Health Maintenance  Topic Date Due  . Hepatitis C Screening  [birth year]  . HIV Screening  09/13/1969  . COLONOSCOPY  09/19/2014  . ZOSTAVAX  01/31/2016 (Originally 09/14/2014)  . INFLUENZA VACCINE  10/21/2015  . TETANUS/TDAP  05/25/2021   Immunization History  Administered Date(s) Administered  . Influenza,inj,Quad PF,36+ Mos 01/11/2013, 01/30/2015  . Td 09/12/1998  . Tdap 05/26/2011   Patient Active Problem List   Diagnosis Date Noted  . Acute bronchitis 01/25/2014  . COLONIC POLYPS, HX OF 08/07/2008  . HYPERLIPIDEMIA 05/18/2007  . HYPERTENSION 05/18/2007  . HYPERGLYCEMIA 05/18/2007  . CARCINOMA, BASAL CELL, HX OF 05/18/2007   Past Medical History  Diagnosis Date  . Personal history of other malignant neoplasm of skin     bASAL CELL  . Other and unspecified hyperlipidemia   . Unspecified essential hypertension   . History of colonic polyps    Past Surgical History  Procedure Laterality Date  . Arthroscopy of knee  1989  .  Vasectomy    . Esophageal dilation  1998 & 2001&2006    3 times  . Shoulder arthroscopy w/ subacromial decompression and distal clavicle excision  08-27-2007   Social History   Social History  . Marital Status: Married    Spouse Name: N/A  . Number of Children: 2  . Years of Education: N/A   Occupational History  . sales    Social History Main Topics  . Smoking status: Never Smoker   . Smokeless tobacco: Never Used  . Alcohol Use: No  . Drug Use: No  . Sexual Activity: Not on file   Other Topics Concern  . Not on file   Social History Narrative   Family History  Problem Relation Age of Onset  . Cancer Mother     bladder  . Diabetes Father   . Hyperlipidemia Father   . Coronary artery disease Father   . Cancer Paternal Uncle     brain  . Stroke Maternal Grandfather   . Cancer Paternal Grandfather     lung   No Known Allergies  Medication list has been reviewed and updated.   General: Denies fever, chills, sweats. No significant weight loss. Eyes: Denies blurring,significant itching ENT: Denies earache, sore throat, and hoarseness. Cardiovascular: Denies chest pains, palpitations, dyspnea on exertion Respiratory: Denies cough, dyspnea at rest,wheeezing Breast: no concerns about lumps GI: Denies nausea, vomiting, diarrhea, constipation, change in bowel habits, abdominal pain, melena, hematochezia GU: Denies penile discharge, ED, urinary flow / outflow problems. No STD concerns. Musculoskeletal: Denies back pain,  joint pain Derm: MULTIPLE BCC THIS YEAR, UPCOMING MOHS SURGERY Neuro: Denies  paresthesias, frequent falls, frequent headaches Psych: Denies depression, anxiety Endocrine: Denies cold intolerance, heat intolerance, polydipsia Heme: Denies enlarged lymph nodes Allergy: No hayfever  Objective:   BP 142/77 mmHg  Pulse 59  Temp(Src) 98.5 F (36.9 C) (Oral)  Ht 6' (1.829 m)  Wt 247 lb (112.038 kg)  BMI 33.49 kg/m2 Ideal Body Weight: Weight in (lb)  to have BMI = 25: 183.9  No exam data present  GEN: well developed, well nourished, no acute distress Eyes: conjunctiva and lids normal, PERRLA, EOMI ENT: TM clear, nares clear, oral exam WNL Neck: supple, no lymphadenopathy, no thyromegaly, no JVD Pulm: clear to auscultation and percussion, respiratory effort normal CV: regular rate and rhythm, S1-S2, no murmur, rub or gallop, no bruits, peripheral pulses normal and symmetric, no cyanosis, clubbing, edema or varicosities GI: soft, non-tender; no hepatosplenomegaly, masses; active bowel sounds all quadrants GU: no hernia, testicular mass, penile discharge Lymph: no cervical, axillary or inguinal adenopathy MSK: gait normal, muscle tone and strength WNL, no joint swelling, effusions, discoloration, crepitus  SKIN: clear, good turgor, color WNL, no rashes, lesions, or ulcerations Neuro: normal mental status, normal strength, sensation, and motion Psych: alert; oriented to person, place and time, normally interactive and not anxious or depressed in appearance. All labs reviewed with patient.  Lipids:    Component Value Date/Time   CHOL 200 01/28/2015 1229   TRIG 83.0 01/28/2015 1229   HDL 55.10 01/28/2015 1229   VLDL 16.6 01/28/2015 1229   CHOLHDL 4 01/28/2015 1229   CBC: CBC Latest Ref Rng 01/28/2015 06/25/2013 06/26/2012  WBC 4.0 - 10.5 K/uL 7.1 7.7 7.4  Hemoglobin 13.0 - 17.0 g/dL 16.3 15.7 15.7  Hematocrit 39.0 - 52.0 % 48.8 45.6 46.0  Platelets 150.0 - 400.0 K/uL 258.0 246.0 269.4    Basic Metabolic Panel:    Component Value Date/Time   NA 142 01/28/2015 1229   K 4.5 01/28/2015 1229   CL 106 01/28/2015 1229   CO2 30 01/28/2015 1229   BUN 12 01/28/2015 1229   CREATININE 0.72 01/28/2015 1229   GLUCOSE 109* 01/28/2015 1229   CALCIUM 9.4 01/28/2015 1229   Hepatic Function Latest Ref Rng 01/28/2015 06/25/2013 06/26/2012  Total Protein 6.0 - 8.3 g/dL 6.5 6.3 7.0  Albumin 3.5 - 5.2 g/dL 4.1 3.7 4.1  AST 0 - 37 U/L _0 ALT 0  - 53 U/L _1 Alk Phosphatase 39 - 117 U/L 74 67 73  Total Bilirubin 0.2 - 1.2 mg/dL 0.9 0.6 1.1  Bilirubin, Direct 0.0 - 0.3 mg/dL 0.2 0.1 0.2    Lab Results  Component Value Date   TSH 0.34* 01/28/2015   Lab Results  Component Value Date   PSA 1.21 01/28/2015   PSA 1.44 06/25/2013   PSA 1.40 06/26/2012    Assessment and Plan:   Healthcare maintenance  Low TSH level - Plan: TSH, T3, free, T4, free  Need for prophylactic vaccination and inoculation against influenza - Plan: Flu Vaccine QUAD 36+ mos IM  Health Maintenance Exam: The patient's preventative maintenance and recommended screening tests for an annual wellness exam were reviewed in full today. Brought up to date unless services declined.  Counselled on the importance of diet, exercise, and its role in overall health and mortality. The patient's FH and SH was reviewed, including their home life, tobacco status, and drug and alcohol status.  Follow-up: No Follow-up on file.  Unless noted, follow-up in 1 year for Health Maintenance Exam.  New Prescriptions   No medications on file   Modified Medications   No medications on file   Orders Placed This Encounter  Procedures  . Flu Vaccine QUAD 36+ mos IM  . TSH  . T3, free  . T4, free    Signed,  Ibrahem Volkman T. Maxon Kresse, MD   Patient's Medications  New Prescriptions   No medications on file  Previous Medications   ENALAPRIL (VASOTEC) 20 MG TABLET    TAKE 2 TABLETS ONCE DAILY   INDOMETHACIN (INDOCIN) 50 MG CAPSULE    Take 1 capsule (50 mg total) by mouth 3 (three) times daily with meals.   MULTIPLE VITAMINS-MINERALS (MULTIVITAMIN WITH MINERALS) TABLET    Take 1 tablet by mouth daily.   OMEPRAZOLE (PRILOSEC) 20 MG CAPSULE    TAKE ONE CAPSULE BY MOUTH ONCE DAILY  Modified Medications   No medications on file  Discontinued Medications   No medications on file

## 2015-03-20 ENCOUNTER — Encounter: Payer: Self-pay | Admitting: Family Medicine

## 2015-03-20 ENCOUNTER — Ambulatory Visit (INDEPENDENT_AMBULATORY_CARE_PROVIDER_SITE_OTHER): Payer: BLUE CROSS/BLUE SHIELD | Admitting: Family Medicine

## 2015-03-20 VITALS — BP 150/82 | HR 64 | Temp 98.1°F | Ht 72.0 in | Wt 250.8 lb

## 2015-03-20 DIAGNOSIS — B3789 Other sites of candidiasis: Secondary | ICD-10-CM

## 2015-03-20 MED ORDER — FLUCONAZOLE 150 MG PO TABS: 150.0000 mg | ORAL_TABLET | Freq: Every day | ORAL | Status: DC

## 2015-03-20 NOTE — Patient Instructions (Signed)
CLOTRIMAZOLE Cream: twice a day (Over the counter)

## 2015-03-20 NOTE — Progress Notes (Signed)
Dr. Frederico Hamman T. Lenin Kuhnle, MD, [facility] [place], [address] Phone: [phone] Fax: [phone]  03/20/2015  Patient: Nicholas Thompson, MRN: [id number], DOB: 22-Oct-1955, 59 y.o.  Primary Physician:  Owens Loffler, MD   Chief Complaint  Patient presents with  . Rash    Under Arms and Groin Area   Subjective:   Nicholas Thompson is a 59 y.o. very pleasant male patient who presents with the following:  Candida: painful red rash under arms and groin, failure of nystatin cream, though this helps it some.  Past Medical History, Surgical History, Social History, Family History, Problem List, Medications, and Allergies have been reviewed and updated if relevant.  Patient Active Problem List   Diagnosis Date Noted  . Acute bronchitis 01/25/2014  . COLONIC POLYPS, HX OF 08/07/2008  . HYPERLIPIDEMIA 05/18/2007  . HYPERTENSION 05/18/2007  . HYPERGLYCEMIA 05/18/2007  . CARCINOMA, BASAL CELL, HX OF 05/18/2007    Past Medical History  Diagnosis Date  . Personal history of other malignant neoplasm of skin     bASAL CELL  . Other and unspecified hyperlipidemia   . Unspecified essential hypertension   . History of colonic polyps     Past Surgical History  Procedure Laterality Date  . Arthroscopy of knee  1989  . Vasectomy    . Esophageal dilation  1998 & 2001&2006    3 times  . Shoulder arthroscopy w/ subacromial decompression and distal clavicle excision  08-27-2007    Social History   Social History  . Marital Status: Married    Spouse Name: N/A  . Number of Children: 2  . Years of Education: N/A   Occupational History  . sales    Social History Main Topics  . Smoking status: Never Smoker   . Smokeless tobacco: Never Used  . Alcohol Use: No  . Drug Use: No  . Sexual Activity: Not on file   Other Topics Concern  . Not on file   Social History Narrative    Family History  Problem Relation Age of Onset  .  Cancer Mother     bladder  . Diabetes Father   . Hyperlipidemia Father   . Coronary artery disease Father   . Cancer Paternal Uncle     brain  . Stroke Maternal Grandfather   . Cancer Paternal Grandfather     lung    No Known Allergies  Medication list reviewed and updated in full in [place].   GEN: No acute illnesses, no fevers, chills. GI: No n/v/d, eating normally Pulm: No SOB Interactive and getting along well at home.  Otherwise, ROS is as per the HPI.  Objective:   BP 150/82 mmHg  Pulse 64  Temp(Src) 98.1 F (36.7 C) (Oral)  Ht 6' (1.829 m)  Wt 250 lb 12 oz (113.739 kg)  BMI 34.00 kg/m2  GEN: WDWN, NAD, Non-toxic, A & O x 3 HEENT: Atraumatic, Normocephalic. Neck supple. No masses, No LAD. Ears and Nose: No external deformity. CV: RRR, No M/G/R. No JVD. No thrill. No extra heart sounds. PULM: CTA B, no wheezes, crackles, rhonchi. No retractions. No resp. distress. No accessory muscle use. EXTR: No c/c/e NEURO Normal gait.  PSYCH: Normally interactive. Conversant. Not depressed or anxious appearing.  Calm demeanor.   Flat, red wet rash in groin, axilla  Laboratory and Imaging Data:  Assessment and Plan:   Candida rash of groin  Diflucan  Patient  Instructions  CLOTRIMAZOLE Cream: twice a day (Over the counter)     Follow-up: No Follow-up on file.  New Prescriptions   FLUCONAZOLE (DIFLUCAN) 150 MG TABLET    Take 1 tablet (150 mg total) by mouth daily.   Modified Medications   Modified Medication Previous Medication   OMEPRAZOLE (PRILOSEC) 20 MG CAPSULE omeprazole (PRILOSEC) 20 MG capsule      TAKE ONE CAPSULE BY MOUTH ONCE DAILY    TAKE ONE CAPSULE BY MOUTH ONCE DAILY   No orders of the defined types were placed in this encounter.    Signed,  Maud Deed. Shemeca Lukasik, MD   Patient's Medications  New Prescriptions   FLUCONAZOLE (DIFLUCAN) 150 MG TABLET    Take 1 tablet (150 mg total) by mouth daily.  Previous Medications   ENALAPRIL  (VASOTEC) 20 MG TABLET    TAKE 2 TABLETS ONCE DAILY   MULTIPLE VITAMINS-MINERALS (MULTIVITAMIN WITH MINERALS) TABLET    Take 1 tablet by mouth daily.  Modified Medications   Modified Medication Previous Medication   OMEPRAZOLE (PRILOSEC) 20 MG CAPSULE omeprazole (PRILOSEC) 20 MG capsule      TAKE ONE CAPSULE BY MOUTH ONCE DAILY    TAKE ONE CAPSULE BY MOUTH ONCE DAILY  Discontinued Medications   INDOMETHACIN (INDOCIN) 50 MG CAPSULE    Take 1 capsule (50 mg total) by mouth 3 (three) times daily with meals.

## 2015-03-20 NOTE — Progress Notes (Signed)
Pre visit review using our clinic review tool, if applicable. No additional management support is needed unless otherwise documented below in the visit note. 

## 2015-03-21 ENCOUNTER — Other Ambulatory Visit: Payer: Self-pay | Admitting: Family Medicine

## 2015-03-28 ENCOUNTER — Encounter: Payer: Self-pay | Admitting: Family Medicine

## 2015-04-01 ENCOUNTER — Other Ambulatory Visit: Payer: Self-pay | Admitting: Family Medicine

## 2015-04-01 ENCOUNTER — Encounter: Payer: Self-pay | Admitting: Family Medicine

## 2015-04-01 MED ORDER — FLUCONAZOLE 100 MG PO TABS: 100.0000 mg | ORAL_TABLET | Freq: Every day | ORAL | Status: DC

## 2015-04-01 MED ORDER — KETOCONAZOLE 2 % EX CREA: 1.0000 "application " | TOPICAL_CREAM | Freq: Every day | CUTANEOUS | Status: DC

## 2015-04-23 HISTORY — PX: MOHS SURGERY: SHX181

## 2015-05-08 ENCOUNTER — Encounter: Payer: Self-pay | Admitting: Family Medicine

## 2015-05-08 ENCOUNTER — Ambulatory Visit (INDEPENDENT_AMBULATORY_CARE_PROVIDER_SITE_OTHER): Payer: BLUE CROSS/BLUE SHIELD | Admitting: Family Medicine

## 2015-05-08 VITALS — BP 144/82 | HR 86 | Temp 99.0°F | Ht 72.0 in | Wt 250.8 lb

## 2015-05-08 DIAGNOSIS — R55 Syncope and collapse: Secondary | ICD-10-CM | POA: Diagnosis not present

## 2015-05-08 NOTE — Progress Notes (Signed)
Pre visit review using our clinic review tool, if applicable. No additional management support is needed unless otherwise documented below in the visit note. 

## 2015-05-08 NOTE — Progress Notes (Signed)
Dr. Frederico Hamman T. Conchetta Lamia, MD, [facility] [place], [address] Phone: [phone] Fax: [phone]  05/08/2015  Patient: Nicholas Thompson, MRN: [id number], DOB: 04/09/1955, 60 y.o.  Primary Physician:  Owens Loffler, MD   Chief Complaint  Patient presents with  . Blacked Out Yesterday Morning    EMS called-thought he was dehydrated   Subjective:   Nicholas Thompson is a 60 y.o. very pleasant male patient who presents with the following:  MOHS surgery on Monday.  Yesterday morning, was up getting up to go to work. Remember telling was getting lightheaded. Was on back - on the floor looking on the ceiling. Out less than 30 sec. Eyes open - fixed and he fully syncopized. No tonic clonic activity and he promptly recovered. Drinking gatorade after that.   No palpitations or tachycardia.  Surgery was on Monday.  Had not had enough fluid.  Tues woke up vomitting.   Did not eat much of anything more than crackers.   Past Medical History, Surgical History, Social History, Family History, Problem List, Medications, and Allergies have been reviewed and updated if relevant.  Patient Active Problem List   Diagnosis Date Noted  . Acute bronchitis 01/25/2014  . COLONIC POLYPS, HX OF 08/07/2008  . HYPERLIPIDEMIA 05/18/2007  . HYPERTENSION 05/18/2007  . HYPERGLYCEMIA 05/18/2007  . CARCINOMA, BASAL CELL, HX OF 05/18/2007    Past Medical History  Diagnosis Date  . Personal history of other malignant neoplasm of skin     bASAL CELL  . Other and unspecified hyperlipidemia   . Unspecified essential hypertension   . History of colonic polyps     Past Surgical History  Procedure Laterality Date  . Arthroscopy of knee  1989  . Vasectomy    . Esophageal dilation  1998 & 2001&2006    3 times  . Shoulder arthroscopy w/ subacromial decompression and distal clavicle excision  08-27-2007    Social History   Social History  . Marital  Status: Married    Spouse Name: N/A  . Number of Children: 2  . Years of Education: N/A   Occupational History  . sales    Social History Main Topics  . Smoking status: Never Smoker   . Smokeless tobacco: Never Used  . Alcohol Use: No  . Drug Use: No  . Sexual Activity: Not on file   Other Topics Concern  . Not on file   Social History Narrative    Family History  Problem Relation Age of Onset  . Cancer Mother     bladder  . Diabetes Father   . Hyperlipidemia Father   . Coronary artery disease Father   . Cancer Paternal Uncle     brain  . Stroke Maternal Grandfather   . Cancer Paternal Grandfather     lung    No Known Allergies  Medication list reviewed and updated in full in [place].   GEN: No acute illnesses, no fevers, chills. GI: No n/v/d, eating normally Pulm: No SOB Interactive and getting along well at home.  Otherwise, ROS is as per the HPI.  Objective:   Filed Vitals:   05/08/15 1450  BP: 144/82  Pulse: 86  Temp: 99 F (37.2 C)  TempSrc: Oral  Height: 6' (1.829 m)  Weight: 250 lb 12 oz (113.739 kg)   Orthostatic VS for the past 24 hrs:  BP- Lying Pulse- Lying BP- Sitting Pulse- Sitting BP- Standing  at 0 minutes Pulse- Standing at 0 minutes  05/08/15 1604 162/90 mmHg 75 (!) 166/95 mmHg 85 (!) 167/91 mmHg 84    GEN: WDWN, NAD, Non-toxic, A & O x 3 HEENT: Atraumatic, Normocephalic. Neck supple. No masses, No LAD. Ears and Nose: No external deformity. CV: RRR, No M/G/R. No JVD. No thrill. No extra heart sounds. PULM: CTA B, no wheezes, crackles, rhonchi. No retractions. No resp. distress. No accessory muscle use. EXTR: No c/c/e NEURO Normal gait.  PSYCH: Normally interactive. Conversant. Not depressed or anxious appearing.  Calm demeanor.   Laboratory and Imaging Data:  Assessment and Plan:   Syncope, unspecified syncope type - Plan: EKG 12-Lead, CBC with Differential/Platelet, Basic metabolic panel, Hepatic function panel,  TSH  EKG: Normal sinus rhythm. Normal axis, normal R wave progression, No acute ST elevation or depression.   By history, seems to have been neurocardiogenic syncope.  EKG yest and today normal.  Check labs for abnormals. Stay at home through the weekend, rest and recovery.  Follow-up: No Follow-up on file.  Orders Placed This Encounter  Procedures  . CBC with Differential/Platelet  . Basic metabolic panel  . Hepatic function panel  . TSH  . EKG 12-Lead    Signed,  Jonathon Castelo T. Mohammad Granade, MD   Patient's Medications  New Prescriptions   No medications on file  Previous Medications   CEPHALEXIN (KEFLEX) 500 MG CAPSULE    Take 500 mg by mouth 2 (two) times daily.    ENALAPRIL (VASOTEC) 20 MG TABLET    TAKE 2 TABLETS ONCE DAILY   FLUCONAZOLE (DIFLUCAN) 100 MG TABLET    Take 1 tablet (100 mg total) by mouth daily.   KETOCONAZOLE (NIZORAL) 2 % CREAM    Apply 1 application topically daily.   MULTIPLE VITAMINS-MINERALS (MULTIVITAMIN WITH MINERALS) TABLET    Take 1 tablet by mouth daily.   OMEPRAZOLE (PRILOSEC) 20 MG CAPSULE    TAKE ONE CAPSULE BY MOUTH ONCE DAILY   OXYCODONE (OXY IR/ROXICODONE) 5 MG IMMEDIATE RELEASE TABLET    Take 5 mg by mouth every 6 (six) hours as needed.   Modified Medications   No medications on file  Discontinued Medications   FLUCONAZOLE (DIFLUCAN) 100 MG TABLET    Take by mouth.

## 2015-05-09 ENCOUNTER — Encounter: Payer: Self-pay | Admitting: Family Medicine

## 2015-05-09 LAB — TSH: TSH: 0.39 u[IU]/mL (ref 0.35–4.50)

## 2015-05-09 LAB — CBC WITH DIFFERENTIAL/PLATELET
BASOS PCT: 0.6 % (ref 0.0–3.0)
Basophils Absolute: 0.1 10*3/uL (ref 0.0–0.1)
EOS PCT: 0.8 % (ref 0.0–5.0)
Eosinophils Absolute: 0.1 10*3/uL (ref 0.0–0.7)
HEMATOCRIT: 47.8 % (ref 39.0–52.0)
HEMOGLOBIN: 16.5 g/dL (ref 13.0–17.0)
Lymphocytes Relative: 15.8 % (ref 12.0–46.0)
Lymphs Abs: 2.1 10*3/uL (ref 0.7–4.0)
MCHC: 34.5 g/dL (ref 30.0–36.0)
MCV: 89.4 fl (ref 78.0–100.0)
MONO ABS: 1.1 10*3/uL — AB (ref 0.1–1.0)
MONOS PCT: 7.9 % (ref 3.0–12.0)
Neutro Abs: 10.1 10*3/uL — ABNORMAL HIGH (ref 1.4–7.7)
Neutrophils Relative %: 74.9 % (ref 43.0–77.0)
Platelets: 285 10*3/uL (ref 150.0–400.0)
RBC: 5.34 Mil/uL (ref 4.22–5.81)
RDW: 12.8 % (ref 11.5–15.5)
WBC: 13.5 10*3/uL — AB (ref 4.0–10.5)

## 2015-05-09 LAB — HEPATIC FUNCTION PANEL
ALBUMIN: 4.2 g/dL (ref 3.5–5.2)
ALK PHOS: 74 U/L (ref 39–117)
ALT: 19 U/L (ref 0–53)
AST: 15 U/L (ref 0–37)
Bilirubin, Direct: 0 mg/dL (ref 0.0–0.3)
TOTAL PROTEIN: 7.5 g/dL (ref 6.0–8.3)
Total Bilirubin: 0.7 mg/dL (ref 0.2–1.2)

## 2015-05-09 LAB — BASIC METABOLIC PANEL
BUN: 15 mg/dL (ref 6–23)
CALCIUM: 9.9 mg/dL (ref 8.4–10.5)
CO2: 28 meq/L (ref 19–32)
CREATININE: 0.84 mg/dL (ref 0.40–1.50)
Chloride: 104 mEq/L (ref 96–112)
GFR: 98.85 mL/min (ref >60.00)
GLUCOSE: 100 mg/dL — AB (ref 70–99)
Potassium: 4.5 mEq/L (ref 3.5–5.1)
SODIUM: 142 meq/L (ref 135–145)

## 2015-05-11 ENCOUNTER — Emergency Department
Admission: EM | Admit: 2015-05-11 | Discharge: 2015-05-11 | Disposition: A | Discharge status: Home / Self Care | Payer: BLUE CROSS/BLUE SHIELD | Attending: Emergency Medicine | Admitting: Emergency Medicine

## 2015-05-11 ENCOUNTER — Emergency Department: Payer: BLUE CROSS/BLUE SHIELD

## 2015-05-11 DIAGNOSIS — W1839XA Other fall on same level, initial encounter: Secondary | ICD-10-CM | POA: Diagnosis not present

## 2015-05-11 DIAGNOSIS — Z79899 Other long term (current) drug therapy: Secondary | ICD-10-CM | POA: Diagnosis not present

## 2015-05-11 DIAGNOSIS — Z792 Long term (current) use of antibiotics: Secondary | ICD-10-CM | POA: Insufficient documentation

## 2015-05-11 DIAGNOSIS — R109 Unspecified abdominal pain: Secondary | ICD-10-CM | POA: Diagnosis not present

## 2015-05-11 DIAGNOSIS — Y998 Other external cause status: Secondary | ICD-10-CM | POA: Insufficient documentation

## 2015-05-11 DIAGNOSIS — Y9389 Activity, other specified: Secondary | ICD-10-CM | POA: Diagnosis not present

## 2015-05-11 DIAGNOSIS — M62830 Muscle spasm of back: Secondary | ICD-10-CM | POA: Diagnosis not present

## 2015-05-11 DIAGNOSIS — M7981 Nontraumatic hematoma of soft tissue: Secondary | ICD-10-CM | POA: Diagnosis not present

## 2015-05-11 DIAGNOSIS — Y9289 Other specified places as the place of occurrence of the external cause: Secondary | ICD-10-CM | POA: Insufficient documentation

## 2015-05-11 DIAGNOSIS — G893 Neoplasm related pain (acute) (chronic): Secondary | ICD-10-CM | POA: Insufficient documentation

## 2015-05-11 LAB — CBC WITH DIFFERENTIAL/PLATELET
Basophils Absolute: 0 10*3/uL (ref 0–0.1)
Basophils Relative: 1 %
EOS ABS: 0.1 10*3/uL (ref 0–0.7)
EOS PCT: 1 %
HCT: 44.4 % (ref 40.0–52.0)
Hemoglobin: 15.4 g/dL (ref 13.0–18.0)
LYMPHS ABS: 1.6 10*3/uL (ref 1.0–3.6)
LYMPHS PCT: 19 %
MCH: 30.6 pg (ref 26.0–34.0)
MCHC: 34.8 g/dL (ref 32.0–36.0)
MCV: 88.1 fL (ref 80.0–100.0)
MONO ABS: 0.7 10*3/uL (ref 0.2–1.0)
MONOS PCT: 9 %
Neutro Abs: 6 10*3/uL (ref 1.4–6.5)
Neutrophils Relative %: 70 %
PLATELETS: 276 10*3/uL (ref 150–440)
RBC: 5.04 MIL/uL (ref 4.40–5.90)
RDW: 12.6 % (ref 11.5–14.5)
WBC: 8.4 10*3/uL (ref 3.8–10.6)

## 2015-05-11 LAB — COMPREHENSIVE METABOLIC PANEL
ALBUMIN: 3.9 g/dL (ref 3.5–5.0)
ALT: 25 U/L (ref 17–63)
AST: 25 U/L (ref 15–41)
Alkaline Phosphatase: 74 U/L (ref 38–126)
Anion gap: 9 (ref 5–15)
BUN: 9 mg/dL (ref 6–20)
CHLORIDE: 104 mmol/L (ref 101–111)
CO2: 27 mmol/L (ref 22–32)
CREATININE: 0.64 mg/dL (ref 0.61–1.24)
Calcium: 9 mg/dL (ref 8.9–10.3)
GFR calc Af Amer: >60 mL/min (ref >60)
GLUCOSE: 111 mg/dL — AB (ref 65–99)
POTASSIUM: 3.4 mmol/L — AB (ref 3.5–5.1)
Sodium: 140 mmol/L (ref 135–145)
Total Bilirubin: 0.9 mg/dL (ref 0.3–1.2)
Total Protein: 7.3 g/dL (ref 6.5–8.1)

## 2015-05-11 MED ORDER — HYDROMORPHONE HCL 1 MG/ML IJ SOLN
1.0000 mg | Freq: Once | INTRAMUSCULAR | Status: AC
  Administered 2015-05-11: 1 mg via INTRAVENOUS
  Filled 2015-05-11: qty 1

## 2015-05-11 MED ORDER — HYDROMORPHONE HCL 2 MG PO TABS: 2.0000 mg | ORAL_TABLET | Freq: Two times a day (BID) | ORAL | Status: DC | PRN

## 2015-05-11 MED ORDER — SODIUM CHLORIDE 0.9 % IV BOLUS (SEPSIS)
500.0000 mL | Freq: Once | INTRAVENOUS | Status: AC
  Administered 2015-05-11: 500 mL via INTRAVENOUS

## 2015-05-11 MED ORDER — ONDANSETRON HCL 4 MG/2ML IJ SOLN
4.0000 mg | Freq: Once | INTRAMUSCULAR | Status: AC
  Administered 2015-05-11: 4 mg via INTRAVENOUS
  Filled 2015-05-11: qty 2

## 2015-05-11 MED ORDER — LORAZEPAM 1 MG PO TABS: 1.0000 mg | ORAL_TABLET | Freq: Three times a day (TID) | ORAL | Status: DC | PRN

## 2015-05-11 NOTE — Discharge Instructions (Signed)
Flank Pain Flank pain refers to pain that is located on the side of the body between the upper abdomen and the back. The pain may occur over a short period of time (acute) or may be long-term or reoccurring (chronic). It may be mild or severe. Flank pain can be caused by many things. CAUSES  Some of the more common causes of flank pain include:  Muscle strains.   Muscle spasms.   A disease of your spine (vertebral disk disease).   A lung infection (pneumonia).   Fluid around your lungs (pulmonary edema).   A kidney infection.   Kidney stones.   A very painful skin rash caused by the chickenpox virus (shingles).   Gallbladder disease.  [place] care will depend on the cause of your pain. In general,  Rest as directed by your caregiver.  Drink enough fluids to keep your urine clear or pale yellow.  Only take over-the-counter or prescription medicines as directed by your caregiver. Some medicines may help relieve the pain.  Tell your caregiver about any changes in your pain.  Follow up with your caregiver as directed. SEEK IMMEDIATE MEDICAL CARE IF:   Your pain is not controlled with medicine.   You have new or worsening symptoms.  Your pain increases.   You have abdominal pain.   You have shortness of breath.   You have persistent nausea or vomiting.   You have swelling in your abdomen.   You feel faint or pass out.   You have blood in your urine.  You have a fever or persistent symptoms for more than 2-3 days.  You have a fever and your symptoms suddenly get worse. MAKE SURE YOU:   Understand these instructions.  Will watch your condition.  Will get help right away if you are not doing well or get worse.   This information is not intended to replace advice given to you by your health care provider. Make sure you discuss any questions you have with your health care provider.   Document Released: 04/29/2005 Document  Revised: 12/01/2011 Document Reviewed: 10/21/2011 Elsevier Interactive Patient Education [employer].  Please return immediately if condition worsens. Please contact her primary physician or the physician you were given for referral. If you have any specialist physicians involved in her treatment and plan please also contact them. Thank you for using [facility]. Please return emergency department especially for blood in the urine, fever, uncontrolled pain, uncontrolled vomiting, or any other new concerns.

## 2015-05-11 NOTE — ED Notes (Addendum)
Pt came to ED via EMS c/o left lower back pain. Pt reports syncopal episode Wednesday and believes he hurt his back then. Pt has bruising to left lower back. Pt denies any chest pain

## 2015-05-11 NOTE — ED Provider Notes (Signed)
Time Seen: Approximately 11 AM I have reviewed the triage notes  Chief Complaint: Back Pain   History of Present Illness: Nicholas Thompson is a 60 y.o. male who is had some removal of skin lesions diagnosis squamous cell carcinoma. He is currently under any radiation or chemotherapy. Patient states he's had some pain from the areas of her section and had a non-syncopal fall recently on Wednesday. Patient states that today he seemed to develop some back "" spasm "" pain patient denies any weakness in either upper or lower extremities. He has been able to ambulate. He states when the pain gets real bad that he seems to have feelings of lightheadedness and nausea without true syncopal episode. Patient states his main concern is the left flank pain. Denies any hematuria. Denies any other contusions or injuries. He denies any midline back pain. He denies any weakness in either upper or lower extremities.   Past Medical History  Diagnosis Date  . Basal cell carcinoma     bASAL CELL  . Other and unspecified hyperlipidemia   . Unspecified essential hypertension   . History of colonic polyps     Patient Active Problem List   Diagnosis Date Noted  . COLONIC POLYPS, HX OF 08/07/2008  . HYPERLIPIDEMIA 05/18/2007  . HYPERTENSION 05/18/2007  . HYPERGLYCEMIA 05/18/2007  . CARCINOMA, BASAL CELL, HX OF 05/18/2007    Past Surgical History  Procedure Laterality Date  . Arthroscopy of knee  1989  . Vasectomy    . Esophageal dilation  1998 & 2001&2006    3 times  . Shoulder arthroscopy w/ subacromial decompression and distal clavicle excision  08-27-2007  . Mohs surgery Left 04/2015    [facility]    Past Surgical History  Procedure Laterality Date  . Arthroscopy of knee  1989  . Vasectomy    . Esophageal dilation  1998 & 2001&2006    3 times  . Shoulder arthroscopy w/ subacromial decompression and distal clavicle excision  08-27-2007  . Mohs surgery Left 04/2015    [facility]    Current Outpatient  Rx  Name  Route  Sig  Dispense  Refill  . cephALEXin (KEFLEX) 500 MG capsule   Oral   Take 500 mg by mouth 2 (two) times daily.          . enalapril (VASOTEC) 20 MG tablet      TAKE 2 TABLETS ONCE DAILY   180 tablet   0   . fluconazole (DIFLUCAN) 100 MG tablet   Oral   Take 1 tablet (100 mg total) by mouth daily.   30 tablet   1   . HYDROmorphone (DILAUDID) 2 MG tablet   Oral   Take 1 tablet (2 mg total) by mouth every 12 (twelve) hours as needed for severe pain.   20 tablet   0   . ketoconazole (NIZORAL) 2 % cream   Topical   Apply 1 application topically daily.   60 g   3   . LORazepam (ATIVAN) 1 MG tablet   Oral   Take 1 tablet (1 mg total) by mouth every 8 (eight) hours as needed for anxiety.   12 tablet   0   . Multiple Vitamins-Minerals (MULTIVITAMIN WITH MINERALS) tablet   Oral   Take 1 tablet by mouth daily.         Marland Kitchen omeprazole (PRILOSEC) 20 MG capsule      TAKE ONE CAPSULE BY MOUTH ONCE DAILY   30  capsule   11   . oxyCODONE (OXY IR/ROXICODONE) 5 MG immediate release tablet   Oral   Take 5 mg by mouth every 6 (six) hours as needed.            Allergies:  Review of patient's allergies indicates no known allergies.  Family History: Family History  Problem Relation Age of Onset  . Cancer Mother     bladder  . Diabetes Father   . Hyperlipidemia Father   . Coronary artery disease Father   . Cancer Paternal Uncle     brain  . Stroke Maternal Grandfather   . Cancer Paternal Grandfather     lung    Social History: Social History  Substance Use Topics  . Smoking status: Never Smoker   . Smokeless tobacco: Never Used  . Alcohol Use: No     Review of Systems:   10 point review of systems was performed and was otherwise negative:  Constitutional: No fever Eyes: No visual disturbances ENT: No sore throat, ear pain Cardiac: No chest pain Respiratory: No shortness of breath, wheezing, or stridor Abdomen: No abdominal pain, no  vomiting, No diarrhea Endocrine: No weight loss, No night sweats Extremities: No peripheral edema, cyanosis Skin: No rashes, easy bruising Neurologic: No focal weakness, trouble with speech or swollowing Urologic: No dysuria, Hematuria, or urinary frequency Patient's had some brief near syncopal symptoms that seem to be pain related.  Physical Exam:  ED Triage Vitals  Enc Vitals Group     BP 05/11/15 1045 156/83 mmHg     Pulse Rate 05/11/15 1045 62     Resp 05/11/15 1045 16     Temp 05/11/15 1045 98 F (36.7 C)     Temp Source 05/11/15 1045 Oral     SpO2 05/11/15 1045 100 %     Weight 05/11/15 1045 250 lb (113.399 kg)     Height 05/11/15 1045 6\' 2"  (1.88 m)     Head Cir --      Peak Flow --      Pain Score 05/11/15 1225 0     Pain Loc --      Pain Edu? --      Excl. in [place]? --     General: Awake , Alert , and Oriented times 3; GCS 15 Head: Normal cephalic , atraumatic Eyes: Pupils equal , round, reactive to light Nose/Throat: No nasal drainage, patent upper airway without erythema or exudate.  Neck: Supple, Full range of motion, No anterior adenopathy or palpable thyroid masses Lungs: Clear to ascultation without wheezes , rhonchi, or rales Heart: Regular rate, regular rhythm without murmurs , gallops , or rubs Abdomen: Soft, non tender without rebound, guarding , or rigidity; bowel sounds positive and symmetric in all 4 quadrants. No organomegaly .        Extremities: 2 plus symmetric pulses. No edema, clubbing or cyanosis Neurologic: normal ambulation, Motor symmetric without deficits, sensory intact Skin: Patient has a large hematoma on the left flank region No crepitus or step-off is noted the patient has no midline lumbar spine reproducible discomfort.   Labs:   All laboratory work was reviewed including any pertinent negatives or positives listed below:  Labs Reviewed  COMPREHENSIVE METABOLIC PANEL - Abnormal; Notable for the following:    Potassium 3.4 (*)     Glucose, Bld 111 (*)    All other components within normal limits  CBC WITH DIFFERENTIAL/PLATELET   review laboratory work showed no significant abnormalities  Radiology:   EXAM: CT ABDOMEN AND PELVIS WITHOUT CONTRAST  TECHNIQUE: Multidetector CT imaging of the abdomen and pelvis was performed following the standard protocol without IV contrast.  COMPARISON: None.  FINDINGS: Lower chest: No pulmonary nodules, pleural effusions, or infiltrates. Heart size is normal. No imaged pericardial effusion or significant coronary artery calcifications.  Upper abdomen: There is irregular solid attenuation mass associated with coarse calcification emanating from the lower pole the right kidney, estimated to measure 5.0 x 3.4 x 5.1 cm. Findings are suspicious for neoplasm.  Small intrarenal calculi are identified bilaterally. Largest is seen in the midpole region of the left collecting system measuring 8 mm. No hydronephrosis or ureteral stones.  No focal abnormality identified within the liver, spleen, pancreas, or adrenal glands. The gallbladder is present.  Gastrointestinal tract: The stomach and small bowel loops are normal in appearance. The appendix is well seen and has a normal appearance. Colonic loops are normal in appearance. Moderate stool burden.  Pelvis: Prostate gland is enlarged. Urinary bladder has a normal appearance. No free pelvic fluid.  Retroperitoneum: There is atherosclerotic calcification of the abdominal aorta. No retroperitoneal or mesenteric adenopathy.  Abdominal wall: There is a 1.9 cm low-attenuation lesion to the right of the midline of the back at the level of the iliac crest, likely representing a intradermal inclusion cyst. There is minimal subcutaneous stranding in the left flank region, not associated with acute fracture.  Osseous structures: No acute fractures are identified. There is significant degenerative change within the lower  thoracic and lumbar spine.  IMPRESSION: 1. Solid attenuation mass involving the lower pole of the right kidney, suspicious for renal cell carcinoma. Further characterization with contrast-enhanced exam is recommended. Renal protocol CT is recommended. 2. Nonobstructing intrarenal stones bilaterally. 3. No ureteral stones. 4. Bruising along the left flank, not associated with fracture. 5. Probable intradermal inclusion cyst on the back of the level of the iliac crest. 6. Degenerative disc disease of the thoracolumbar spine. No acute vertebral fracture.   Electronically Signed By: Nolon Nations M.D. On: 05/11/2015 12:03 I personally reviewed the radiologic studies    ED Course: * The patient's stay here was uneventful. Patient had symptomatic improvement with IV Dilaudid and Zofran. His CAT scan did not show any evidence of a psoas hematoma. There was likely an incidental finding of a possible right-sided renal mass. This reason the patient was referred to urology on an outpatient basis. I felt he most likely had some muscle spasm from his trauma that he had several days ago. Obvious hematoma on exam in this area but does not appear to have any deep-seated psoas hematoma. His renal function is fine he has no gross hematuria.    Assessment: * Left flank pain Left-sided hematoma Back spasm   Final Clinical Impression:   Final diagnoses:  Left flank pain     Plan: * Outpatient management Patient was advised to return immediately if condition worsens. Patient was advised to follow up with their primary care physician or other specialized physicians involved in their outpatient care Family was given a copy of the reading of the CAT scan and referred to urology unassigned.           Daymon Larsen, MD 05/11/15 [phone]

## 2015-05-23 ENCOUNTER — Ambulatory Visit (INDEPENDENT_AMBULATORY_CARE_PROVIDER_SITE_OTHER): Payer: BLUE CROSS/BLUE SHIELD | Admitting: Urology

## 2015-05-23 ENCOUNTER — Encounter: Payer: Self-pay | Admitting: Urology

## 2015-05-23 VITALS — BP 149/86 | HR 65 | Ht 74.0 in | Wt 246.0 lb

## 2015-05-23 DIAGNOSIS — N200 Calculus of kidney: Secondary | ICD-10-CM | POA: Diagnosis not present

## 2015-05-23 DIAGNOSIS — R3129 Other microscopic hematuria: Secondary | ICD-10-CM

## 2015-05-23 DIAGNOSIS — N2889 Other specified disorders of kidney and ureter: Secondary | ICD-10-CM

## 2015-05-23 LAB — URINALYSIS, COMPLETE
BILIRUBIN UA: NEGATIVE
Glucose, UA: NEGATIVE
Ketones, UA: NEGATIVE
LEUKOCYTES UA: NEGATIVE
Nitrite, UA: NEGATIVE
PH UA: 7 (ref 5.0–7.5)
PROTEIN UA: NEGATIVE
SPEC GRAV UA: 1.015 (ref 1.005–1.030)
Urobilinogen, Ur: 0.2 mg/dL (ref 0.2–1.0)

## 2015-05-23 LAB — MICROSCOPIC EXAMINATION
BACTERIA UA: NONE SEEN
EPITHELIAL CELLS (NON RENAL): NONE SEEN /HPF (ref 0–10)
WBC, UA: NONE SEEN /hpf (ref 0–?)

## 2015-05-23 NOTE — Progress Notes (Signed)
05/23/2015 3:30 PM   LONNE VANDEMARK 03-26-1955 [name]  Referring provider: Owens Loffler, MD [facility] [address] [place], [place] [address]  Chief Complaint  Patient presents with  . New Patient (Initial Visit)    CT Scan, Solid attenuation mass involving the lower pole of the right    HPI: The patient is a 60 year old gentleman presents to discuss her right renal masses seen on CT scan for lower back pain. It is in the lower right pole and is concerning for malignancy however the study was without contrast. He also was shown to have bilateral nephrolithiasis with the largest being 8 mm and all nonobstructing. The patient has never had any urological problems before. He has no history of kidney stones prior to the CT scan. He denies hematuria, nocturia, frequency, urgency, or other urinary issues. This was a completely asymptomatic incidental finding.  PMH: Past Medical History  Diagnosis Date  . Basal cell carcinoma     bASAL CELL  . Other and unspecified hyperlipidemia   . Unspecified essential hypertension   . History of colonic polyps     Surgical History: Past Surgical History  Procedure Laterality Date  . Arthroscopy of knee  1989  . Vasectomy    . Esophageal dilation  1998 & 2001&2006    3 times  . Shoulder arthroscopy w/ subacromial decompression and distal clavicle excision  08-27-2007  . Mohs surgery Left 04/2015    [facility]    Home Medications:    Medication List       This list is accurate as of: 05/23/15  3:30 PM.  Always use your most recent med list.               enalapril 20 MG tablet  Commonly known as:  VASOTEC  TAKE 2 TABLETS ONCE DAILY     fluconazole 100 MG tablet  Commonly known as:  DIFLUCAN  Take 1 tablet (100 mg total) by mouth daily.     HYDROmorphone 2 MG tablet  Commonly known as:  DILAUDID  Take 1 tablet (2 mg total) by mouth every 12 (twelve) hours as needed for severe pain.     ketoconazole 2 %  cream  Commonly known as:  NIZORAL  Apply 1 application topically daily.     LORazepam 1 MG tablet  Commonly known as:  ATIVAN  Take 1 tablet (1 mg total) by mouth every 8 (eight) hours as needed for anxiety.     multivitamin with minerals tablet  Take 1 tablet by mouth daily.     omeprazole 20 MG capsule  Commonly known as:  PRILOSEC  TAKE ONE CAPSULE BY MOUTH ONCE DAILY     oxyCODONE 5 MG immediate release tablet  Commonly known as:  Oxy IR/ROXICODONE  Take 5 mg by mouth every 6 (six) hours as needed.        Allergies: No Known Allergies  Family History: Family History  Problem Relation Age of Onset  . Cancer Mother     bladder  . Diabetes Father   . Hyperlipidemia Father   . Coronary artery disease Father   . Cancer Paternal Uncle     brain  . Stroke Maternal Grandfather   . Cancer Paternal Grandfather     lung  . Prostate cancer Neg Hx     Social History:  reports that he has never smoked. He has never used smokeless tobacco. He reports that he drinks alcohol. He reports  that he does not use illicit drugs.  ROS: UROLOGY Frequent Urination?: Yes Hard to postpone urination?: No Burning/pain with urination?: No Get up at night to urinate?: Yes Leakage of urine?: No Urine stream starts and stops?: No Trouble starting stream?: No Do you have to strain to urinate?: No Blood in urine?: No Urinary tract infection?: No Sexually transmitted disease?: No Injury to kidneys or bladder?: No Painful intercourse?: No Weak stream?: No Erection problems?: No Penile pain?: No  Gastrointestinal Nausea?: Yes Vomiting?: No Indigestion/heartburn?: No Diarrhea?: No Constipation?: Yes  Constitutional Fever: No Night sweats?: No Weight loss?: No Fatigue?: Yes  Skin Skin rash/lesions?: Yes Itching?: Yes  Eyes Blurred vision?: No Double vision?: No  Ears/Nose/Throat Sore throat?: No Sinus problems?: No  Hematologic/Lymphatic Swollen glands?: No Easy  bruising?: No  Cardiovascular Leg swelling?: No Chest pain?: No  Respiratory Cough?: No Shortness of breath?: No  Endocrine Excessive thirst?: No  Musculoskeletal Back pain?: Yes Joint pain?: Yes  Neurological Headaches?: No Dizziness?: No  Psychologic Depression?: No Anxiety?: No  Physical Exam: BP 149/86 mmHg  Pulse 65  Ht 6\' 2"  (1.88 m)  Wt 246 lb (111.585 kg)  BMI 31.57 kg/m2  Constitutional:  Alert and oriented, No acute distress. HEENT: [place] AT, moist mucus membranes.  Trachea midline, no masses. Cardiovascular: No clubbing, cyanosis, or edema. Respiratory: Normal respiratory effort, no increased work of breathing. GI: Abdomen is soft, nontender, nondistended, no abdominal masses GU: No CVA tenderness. Normal phallus. Testicles and equal bilaterally. No masses.  Skin: No rashes, bruises or suspicious lesions. Lymph: No cervical or inguinal adenopathy. Neurologic: Grossly intact, no focal deficits, moving all 4 extremities. Psychiatric: Normal mood and affect.  Laboratory Data: Lab Results  Component Value Date   WBC 8.4 05/11/2015   HGB 15.4 05/11/2015   HCT 44.4 05/11/2015   MCV 88.1 05/11/2015   PLT 276 05/11/2015    Lab Results  Component Value Date   CREATININE 0.64 05/11/2015    Lab Results  Component Value Date   PSA 1.21 01/28/2015   PSA 1.44 06/25/2013   PSA 1.40 06/26/2012    No results found for: TESTOSTERONE  Lab Results  Component Value Date   HGBA1C 5.2 01/28/2015    Urinalysis No results found for: COLORURINE, APPEARANCEUR, LABSPEC, PHURINE, GLUCOSEU, HGBUR, BILIRUBINUR, KETONESUR, PROTEINUR, UROBILINOGEN, NITRITE, LEUKOCYTESUR  Pertinent Imaging: CLINICAL DATA: Pt came to ED via EMS c/o left lower back pain. Pt reports syncopal episode Wednesday and believes he hurt his back then. Pt has bruising to left lower back.  EXAM: CT ABDOMEN AND PELVIS WITHOUT CONTRAST  TECHNIQUE: Multidetector CT imaging of the abdomen  and pelvis was performed following the standard protocol without IV contrast.  COMPARISON: None.  FINDINGS: Lower chest: No pulmonary nodules, pleural effusions, or infiltrates. Heart size is normal. No imaged pericardial effusion or significant coronary artery calcifications.  Upper abdomen: There is irregular solid attenuation mass associated with coarse calcification emanating from the lower pole the right kidney, estimated to measure 5.0 x 3.4 x 5.1 cm. Findings are suspicious for neoplasm.  Small intrarenal calculi are identified bilaterally. Largest is seen in the midpole region of the left collecting system measuring 8 mm. No hydronephrosis or ureteral stones.  No focal abnormality identified within the liver, spleen, pancreas, or adrenal glands. The gallbladder is present.  Gastrointestinal tract: The stomach and small bowel loops are normal in appearance. The appendix is well seen and has a normal appearance. Colonic loops are normal in appearance. Moderate stool  burden.  Pelvis: Prostate gland is enlarged. Urinary bladder has a normal appearance. No free pelvic fluid.  Retroperitoneum: There is atherosclerotic calcification of the abdominal aorta. No retroperitoneal or mesenteric adenopathy.  Abdominal wall: There is a 1.9 cm low-attenuation lesion to the right of the midline of the back at the level of the iliac crest, likely representing a intradermal inclusion cyst. There is minimal subcutaneous stranding in the left flank region, not associated with acute fracture.  Osseous structures: No acute fractures are identified. There is significant degenerative change within the lower thoracic and lumbar spine.  IMPRESSION: 1. Solid attenuation mass involving the lower pole of the right kidney, suspicious for renal cell carcinoma. Further characterization with contrast-enhanced exam is recommended. Renal protocol CT is recommended. 2. Nonobstructing  intrarenal stones bilaterally. 3. No ureteral stones. 4. Bruising along the left flank, not associated with fracture. 5. Probable intradermal inclusion cyst on the back of the level of the iliac crest. 6. Degenerative disc disease of the thoracolumbar spine. No acute vertebral fracture.   Assessment & Plan:    I had a very long discussion the patient regarding this renal mass my concern for being a renal malignancy. We discussed the best way to further delineate this mass to be a CT renal protocol. He understands if it is a solid enhancing mass on CT renal protocol that he would need surgical intervention. We also discussed his kidney stones which will take a backseat until we have addressed his renal mass.  1. Right renal mass -Will better evaluate with a CT renal protocol. This mass is concerning for a malignancy  2. Bilateral nephrolithiasis nonobstructing -We'll address after above.  Return in about 2 weeks (around 06/06/2015) for after CT renal protocol.  [name], MD  [facility] [address], [place], [place] [address] [phone]

## 2015-05-24 NOTE — Addendum Note (Signed)
Addended by: Kerry Hough on: 05/24/2015 11:32 AM   Modules accepted: Orders

## 2015-05-26 ENCOUNTER — Other Ambulatory Visit: Payer: Self-pay | Admitting: Family Medicine

## 2015-05-26 ENCOUNTER — Telehealth: Payer: Self-pay

## 2015-05-26 MED ORDER — ENALAPRIL MALEATE 20 MG PO TABS: 40.0000 mg | ORAL_TABLET | Freq: Every day | ORAL | Status: DC

## 2015-05-26 NOTE — Telephone Encounter (Signed)
Pt waiting on mail order delivery of enalapril and request # [phone] sent to [facility] [place]; advised pt done.

## 2015-06-04 ENCOUNTER — Ambulatory Visit
Admission: RE | Admit: 2015-06-04 | Discharge: 2015-06-04 | Disposition: A | Discharge status: Home / Self Care | Payer: BLUE CROSS/BLUE SHIELD | Source: Ambulatory Visit | Attending: Urology | Admitting: Urology

## 2015-06-04 DIAGNOSIS — K760 Fatty (change of) liver, not elsewhere classified: Secondary | ICD-10-CM | POA: Diagnosis not present

## 2015-06-04 DIAGNOSIS — C641 Malignant neoplasm of right kidney, except renal pelvis: Secondary | ICD-10-CM | POA: Insufficient documentation

## 2015-06-04 DIAGNOSIS — R3129 Other microscopic hematuria: Secondary | ICD-10-CM | POA: Diagnosis present

## 2015-06-04 DIAGNOSIS — N2889 Other specified disorders of kidney and ureter: Secondary | ICD-10-CM | POA: Insufficient documentation

## 2015-06-04 DIAGNOSIS — N200 Calculus of kidney: Secondary | ICD-10-CM | POA: Insufficient documentation

## 2015-06-04 DIAGNOSIS — N400 Enlarged prostate without lower urinary tract symptoms: Secondary | ICD-10-CM | POA: Diagnosis not present

## 2015-06-04 MED ORDER — IOHEXOL 350 MG/ML SOLN
100.0000 mL | Freq: Once | INTRAVENOUS | Status: AC | PRN
  Administered 2015-06-04: 100 mL via INTRAVENOUS

## 2015-06-06 ENCOUNTER — Telehealth: Payer: Self-pay | Admitting: Radiology

## 2015-06-06 ENCOUNTER — Ambulatory Visit: Payer: BLUE CROSS/BLUE SHIELD

## 2015-06-06 ENCOUNTER — Encounter: Payer: Self-pay | Admitting: Urology

## 2015-06-06 ENCOUNTER — Ambulatory Visit (INDEPENDENT_AMBULATORY_CARE_PROVIDER_SITE_OTHER): Payer: BLUE CROSS/BLUE SHIELD | Admitting: Urology

## 2015-06-06 VITALS — BP 157/83 | HR 66 | Ht 74.0 in | Wt 245.9 lb

## 2015-06-06 DIAGNOSIS — N2889 Other specified disorders of kidney and ureter: Secondary | ICD-10-CM

## 2015-06-06 DIAGNOSIS — N200 Calculus of kidney: Secondary | ICD-10-CM

## 2015-06-06 NOTE — Addendum Note (Signed)
Addended by: Kerry Hough on: 06/06/2015 04:21 PM   Modules accepted: Orders

## 2015-06-06 NOTE — Progress Notes (Signed)
06/06/2015 4:11 PM   Nicholas Thompson 1955-10-08 [id number]  Referring provider: Owens Loffler, MD [place] [address] [place], [place] [address]  Chief Complaint  Patient presents with  . Follow-up    renal mass     HPI: The patient is a 60 year old gentleman presents to discuss her right renal masses seen on CT scan for lower back pain. It is in the lower right pole and is concerning for malignancy however the study was without contrast. He also was shown to have bilateral nephrolithiasis with the largest being 8 mm and all nonobstructing. The patient has never had any urological problems before. He has no history of kidney stones prior to the CT scan. He denies hematuria, nocturia, frequency, urgency, or other urinary issues. This was a completely asymptomatic incidental finding.   The patient underwent a CT renal protocol which did show this mass to be 5.2 cm exophytic mass with possible anterior perirenal fat invasion. On my review, it is unclear where this originates whether from the lower pole of the collecting cell on the TCC versus an RCC. He also does have a 1 cm stone in the left lower lower pole of his kidney.    PMH: Past Medical History  Diagnosis Date  . Other and unspecified hyperlipidemia   . Unspecified essential hypertension   . History of colonic polyps   . Basal cell carcinoma     bASAL CELL    Surgical History: Past Surgical History  Procedure Laterality Date  . Arthroscopy of knee  1989  . Vasectomy    . Esophageal dilation  1998 & 2001&2006    3 times  . Shoulder arthroscopy w/ subacromial decompression and distal clavicle excision  08-27-2007  . Mohs surgery Left 04/2015    [facility], [facility]    Home Medications:    Medication List       This list is accurate as of: 06/06/15  4:11 PM.  Always use your most recent med list.               enalapril 20 MG tablet  Commonly known as:  VASOTEC  Take 2 tablets (40 mg total) by mouth  daily.     fluconazole 100 MG tablet  Commonly known as:  DIFLUCAN  Take 1 tablet (100 mg total) by mouth daily.     HYDROmorphone 2 MG tablet  Commonly known as:  DILAUDID  Take 1 tablet (2 mg total) by mouth every 12 (twelve) hours as needed for severe pain.     ketoconazole 2 % cream  Commonly known as:  NIZORAL  Apply 1 application topically daily.     LORazepam 1 MG tablet  Commonly known as:  ATIVAN  Take 1 tablet (1 mg total) by mouth every 8 (eight) hours as needed for anxiety.     multivitamin with minerals tablet  Take 1 tablet by mouth daily. Reported on 06/06/2015     omeprazole 20 MG capsule  Commonly known as:  PRILOSEC  TAKE ONE CAPSULE BY MOUTH ONCE DAILY     oxyCODONE 5 MG immediate release tablet  Commonly known as:  Oxy IR/ROXICODONE  Take 5 mg by mouth every 6 (six) hours as needed. Reported on 06/06/2015        Allergies: No Known Allergies  Family History: Family History  Problem Relation Age of Onset  . Cancer Mother     bladder  . Diabetes Father   . Hyperlipidemia Father   .  Coronary artery disease Father   . Cancer Paternal Uncle     brain  . Stroke Maternal Grandfather   . Cancer Paternal Grandfather     lung  . Prostate cancer Neg Hx     Social History:  reports that he has never smoked. He has never used smokeless tobacco. He reports that he drinks alcohol. He reports that he does not use illicit drugs.  ROS: UROLOGY Frequent Urination?: No Hard to postpone urination?: No Burning/pain with urination?: No Get up at night to urinate?: Yes Leakage of urine?: No Urine stream starts and stops?: No Trouble starting stream?: No Do you have to strain to urinate?: No Blood in urine?: No Urinary tract infection?: No Sexually transmitted disease?: No Injury to kidneys or bladder?: No Painful intercourse?: No Weak stream?: No Erection problems?: No Penile pain?: No  Gastrointestinal Nausea?: No Vomiting?:  No Indigestion/heartburn?: No Diarrhea?: No Constipation?: No  Constitutional Fever: No Night sweats?: No Weight loss?: No Fatigue?: No  Skin Skin rash/lesions?: No Itching?: No  Eyes Blurred vision?: No Double vision?: No  Ears/Nose/Throat Sore throat?: No Sinus problems?: No  Hematologic/Lymphatic Swollen glands?: No Easy bruising?: No  Cardiovascular Leg swelling?: No Chest pain?: No  Respiratory Cough?: No Shortness of breath?: No  Endocrine Excessive thirst?: No  Musculoskeletal Back pain?: No Joint pain?: No  Neurological Headaches?: No Dizziness?: No  Psychologic Depression?: No Anxiety?: No  Physical Exam: BP 157/83 mmHg  Pulse 66  Ht 6\' 2"  (1.88 m)  Wt 245 lb 14.4 oz (111.54 kg)  BMI 31.56 kg/m2  Constitutional:  Alert and oriented, No acute distress. HEENT: [place] AT, moist mucus membranes.  Trachea midline, no masses. Cardiovascular: No clubbing, cyanosis, or edema. Respiratory: Normal respiratory effort, no increased work of breathing. GI: Abdomen is soft, nontender, nondistended, no abdominal masses GU: No CVA tenderness.  Skin: No rashes, bruises or suspicious lesions. Lymph: No cervical or inguinal adenopathy. Neurologic: Grossly intact, no focal deficits, moving all 4 extremities. Psychiatric: Normal mood and affect.  Laboratory Data: Lab Results  Component Value Date   WBC 8.4 05/11/2015   HGB 15.4 05/11/2015   HCT 44.4 05/11/2015   MCV 88.1 05/11/2015   PLT 276 05/11/2015    Lab Results  Component Value Date   CREATININE 0.64 05/11/2015    Lab Results  Component Value Date   PSA 1.21 01/28/2015   PSA 1.44 06/25/2013   PSA 1.40 06/26/2012    No results found for: TESTOSTERONE  Lab Results  Component Value Date   HGBA1C 5.2 01/28/2015    Urinalysis    Component Value Date/Time   APPEARANCEUR Clear 05/23/2015 1454   GLUCOSEU Negative 05/23/2015 1454   BILIRUBINUR Negative 05/23/2015 1454   PROTEINUR  Negative 05/23/2015 1454   NITRITE Negative 05/23/2015 1454   LEUKOCYTESUR Negative 05/23/2015 1454    Pertinent Imaging: CLINICAL DATA: Indeterminate incidental renal mass in the right lower kidney on recent unenhanced CT study.  EXAM: CT ABDOMEN AND PELVIS WITHOUT AND WITH CONTRAST  TECHNIQUE: Multidetector CT imaging of the abdomen and pelvis was performed following the standard protocol before and following the bolus administration of intravenous contrast.  CONTRAST: 166mL OMNIPAQUE IOHEXOL 350 MG/ML SOLN  COMPARISON: 05/11/2015 unenhanced CT abdomen/ pelvis.  FINDINGS: Lower chest: No significant pulmonary nodules or acute consolidative airspace disease.  Hepatobiliary: Mild-to-moderate diffuse hepatic steatosis. No liver mass. Normal gallbladder with no radiopaque cholelithiasis. No biliary ductal dilatation.  Pancreas: Normal, with no mass or duct dilation.  Spleen: Normal  size. No mass.  Adrenals/Urinary Tract: Normal adrenals. There is a 10 x 6 mm stone in the left lower renal collecting system, with overlying 1.9 x 1.5 cm nonenhancing cystic structure, which could represent a dilated calyx or caliceal diverticulum (series 5/image 60). There is a separate 3 mm nonobstructing stone in the left lower kidney. There is a nonobstructing 2 mm stone in the lower right kidney. No hydronephrosis. Normal caliber ureters. There is an avidly enhancing exophytic irregular 4.6 x 3.9 x 5.2 cm renal mass in the anterior lower right kidney (series 9/ image 69), which demonstrates coarse central calcifications and invasion of the anterior perinephric fat, without definite invasion beyond Gerota's fascia. No evidence of renal sinus invasion. Collapsed and grossly normal bladder.  Stomach/Bowel: Grossly normal stomach. Normal caliber small bowel with no small bowel wall thickening. Normal appendix. Normal large bowel with no diverticulosis, large bowel wall  thickening or pericolonic fat stranding.  Vascular/Lymphatic: Atherosclerotic nonaneurysmal abdominal aorta. Patent portal, splenic, hepatic and renal veins. No pathologically enlarged lymph nodes in the abdomen or pelvis.  Reproductive: Mild prostatomegaly.  Other: No pneumoperitoneum, ascites or focal fluid collection. Mild mistiness of the central mesentery without adenopathy, unchanged since 05/11/2015, nonspecific, probably representing mild mesenteric panniculitis.  Musculoskeletal: No aggressive appearing focal osseous lesions. Marked degenerative changes in the visualized thoracolumbar spine. Right paramedian lower back sebaceous cyst measuring 2.2 cm.  IMPRESSION: 1. Irregular avidly enhancing exophytic 4.6 x 3.9 x 5.2 cm renal mass in the anterior lower right kidney, most consistent with a clear cell renal cell carcinoma. Anterior perinephric fat invasion without definite invasion beyond Gerota's fascia. No renal sinus invasion. Patent renal veins without tumor thrombus. 2. Dilated calyx versus calyceal diverticulum associated with a 10 x 6 mm stone in the lower left renal collecting system. Additional tiny nonobstructing stones in both lower kidneys. 3. No evidence of metastatic disease in the abdomen or pelvis. 4. Mild-to-moderate diffuse hepatic steatosis. 5. Mild prostatomegaly.  Assessment & Plan:   1. Right renal mass-TCC versus RCC 2. Left 1 cm lower pole calculus  I had a long conversation with the patient and his family regarding his diagnosis of a right renal mass as well as a left lower pole calculus. On my review of the images, I am unsure if this is a TCC versus RCC in origin. I explained to the patient the difference in the two diagnoses. RCC will require a nephrectomy while TCC would requrie nephroureterectomy which is a more extensive surgery. I feel the best option at this time would be to perform right ureteroscopy with visualization of his right  collecting system to ensure this is not a TCC. If we're unsure at that time, we will take a biopsy if there is suspicious tissue to determine if this is TCC or RCC. If I find no evidence of renal pelvis invasion, I think it would be a good idea during the same procedure to perform left ureteroscopy in order to address his stone burden in his left kidney as it will become a solitary kidney after treating this tumor. I discussed the risks, benefits, and indications of both procedures. He understands risks include but are not limited to bleeding and infection. He also knows that there is a chance it may not be able to reach the desired area during the first procedure. He understands that he will likely need a lateral ureteral stents for this procedure relation. All questions were answered. The patient elected to proceed.    Return  for surgery.  [name], MD  [facility] [address] [place], [place] [address] [phone]

## 2015-06-06 NOTE — Telephone Encounter (Signed)
Notified pt of surgery scheduled 06/11/15, pre-admit testing appt on 06/09/15 @ 1:45 and to call day prior to surgery for arrival time to SDS. Pt voices understanding.

## 2015-06-09 ENCOUNTER — Encounter
Admission: RE | Admit: 2015-06-09 | Discharge: 2015-06-09 | Disposition: A | Discharge status: Home / Self Care | Payer: BLUE CROSS/BLUE SHIELD | Source: Ambulatory Visit | Attending: Urology | Admitting: Urology

## 2015-06-09 ENCOUNTER — Ambulatory Visit
Admission: RE | Admit: 2015-06-09 | Discharge: 2015-06-09 | Disposition: A | Discharge status: Home / Self Care | Payer: BLUE CROSS/BLUE SHIELD | Source: Ambulatory Visit | Attending: Urology | Admitting: Urology

## 2015-06-09 DIAGNOSIS — I700 Atherosclerosis of aorta: Secondary | ICD-10-CM | POA: Diagnosis not present

## 2015-06-09 DIAGNOSIS — I10 Essential (primary) hypertension: Secondary | ICD-10-CM

## 2015-06-09 DIAGNOSIS — N2889 Other specified disorders of kidney and ureter: Secondary | ICD-10-CM | POA: Insufficient documentation

## 2015-06-09 DIAGNOSIS — N200 Calculus of kidney: Secondary | ICD-10-CM | POA: Diagnosis present

## 2015-06-09 HISTORY — DX: Calculus of kidney: N20.0

## 2015-06-09 HISTORY — DX: Unspecified osteoarthritis, unspecified site: M19.90

## 2015-06-09 HISTORY — DX: Gastro-esophageal reflux disease without esophagitis: K21.9

## 2015-06-09 HISTORY — DX: Chronic kidney disease, unspecified: N18.9

## 2015-06-09 LAB — BASIC METABOLIC PANEL
Anion gap: 5 (ref 5–15)
BUN: 10 mg/dL (ref 6–20)
CALCIUM: 9.3 mg/dL (ref 8.9–10.3)
CO2: 29 mmol/L (ref 22–32)
CREATININE: 0.81 mg/dL (ref 0.61–1.24)
Chloride: 106 mmol/L (ref 101–111)
GFR calc Af Amer: >60 mL/min (ref >60)
Glucose, Bld: 118 mg/dL — ABNORMAL HIGH (ref 65–99)
POTASSIUM: 3.8 mmol/L (ref 3.5–5.1)
SODIUM: 140 mmol/L (ref 135–145)

## 2015-06-09 LAB — CBC
HCT: 47.8 % (ref 40.0–52.0)
HEMOGLOBIN: 16.4 g/dL (ref 13.0–18.0)
MCH: 30.3 pg (ref 26.0–34.0)
MCHC: 34.3 g/dL (ref 32.0–36.0)
MCV: 88.4 fL (ref 80.0–100.0)
PLATELETS: 249 10*3/uL (ref 150–440)
RBC: 5.41 MIL/uL (ref 4.40–5.90)
RDW: 12.8 % (ref 11.5–14.5)
WBC: 9.3 10*3/uL (ref 3.8–10.6)

## 2015-06-09 LAB — CULTURE, URINE COMPREHENSIVE

## 2015-06-09 NOTE — Patient Instructions (Signed)
  Your procedure is scheduled on: June 11, 2015 (Wednesday) Report to Day Surgery.[facility]) Second Floor To find out your arrival time please call [phone] between 1PM - 3PM on June 10, 2015 (Tuesday).  Remember: Instructions that are not followed completely may result in serious medical risk, up to and including death, or upon the discretion of your surgeon and anesthesiologist your surgery may need to be rescheduled.    __x__ 1. Do not eat food or drink liquids after midnight. No gum chewing or hard candies.     ____ 2. No Alcohol for 24 hours before or after surgery.   ____ 3. Bring all medications with you on the day of surgery if instructed.    __x__ 4. Notify your doctor if there is any change in your medical condition     (cold, fever, infections).     Do not wear jewelry, make-up, hairpins, clips or nail polish.  Do not wear lotions, powders, or perfumes. You may wear deodorant.  Do not shave 48 hours prior to surgery. Men may shave face and neck.  Do not bring valuables to the hospital.    [facility] is not responsible for any belongings or valuables.               Contacts, dentures or bridgework may not be worn into surgery.  Leave your suitcase in the car. After surgery it may be brought to your room.  For patients admitted to the hospital, discharge time is determined by your                treatment team.   Patients discharged the day of surgery will not be allowed to drive home.   Please read over the following fact sheets that you were given:   Surgical Site Infection Prevention   __x__ Take these medicines the morning of surgery with A SIP OF WATER:    1. Omeprazole (Omeprazole at bedtime Tuesday night, 3/21)  2.   3.   4.  5.  6.  ____ Fleet Enema (as directed)   ____ Use CHG Soap as directed  ____ Use inhalers on the day of surgery  ____ Stop metformin 2 days prior to surgery    ____ Take 1/2 of usual insulin dose the night before  surgery and none on the morning of surgery.   _x___ Stop Coumadin/Plavix/aspirin on (N/A)  _x___ Stop Anti-inflammatories on (NO NSAIDS) Tylenol ok to take for pain if needed   ____ Stop supplements until after surgery.    ____ Bring C-Pap to the hospital.

## 2015-06-10 NOTE — Pre-Procedure Instructions (Signed)
CXR CALLED AND FAXED TO DR [place] SPOKE WITH MICHELE

## 2015-06-11 ENCOUNTER — Encounter: Payer: Self-pay | Admitting: Anesthesiology

## 2015-06-11 ENCOUNTER — Ambulatory Visit: Payer: BLUE CROSS/BLUE SHIELD | Admitting: Anesthesiology

## 2015-06-11 ENCOUNTER — Encounter
Admission: RE | Disposition: A | Discharge status: Home / Self Care | Payer: Self-pay | Source: Ambulatory Visit | Attending: Urology

## 2015-06-11 ENCOUNTER — Telehealth: Payer: Self-pay

## 2015-06-11 ENCOUNTER — Ambulatory Visit
Admission: RE | Admit: 2015-06-11 | Discharge: 2015-06-11 | Disposition: A | Discharge status: Home / Self Care | Payer: BLUE CROSS/BLUE SHIELD | Source: Ambulatory Visit | Attending: Urology | Admitting: Urology

## 2015-06-11 DIAGNOSIS — K760 Fatty (change of) liver, not elsewhere classified: Secondary | ICD-10-CM | POA: Insufficient documentation

## 2015-06-11 DIAGNOSIS — N400 Enlarged prostate without lower urinary tract symptoms: Secondary | ICD-10-CM | POA: Insufficient documentation

## 2015-06-11 DIAGNOSIS — Z833 Family history of diabetes mellitus: Secondary | ICD-10-CM | POA: Insufficient documentation

## 2015-06-11 DIAGNOSIS — Z86010 Personal history of colonic polyps: Secondary | ICD-10-CM | POA: Insufficient documentation

## 2015-06-11 DIAGNOSIS — Z85828 Personal history of other malignant neoplasm of skin: Secondary | ICD-10-CM | POA: Diagnosis not present

## 2015-06-11 DIAGNOSIS — I10 Essential (primary) hypertension: Secondary | ICD-10-CM | POA: Insufficient documentation

## 2015-06-11 DIAGNOSIS — Z8052 Family history of malignant neoplasm of bladder: Secondary | ICD-10-CM | POA: Diagnosis not present

## 2015-06-11 DIAGNOSIS — R911 Solitary pulmonary nodule: Secondary | ICD-10-CM

## 2015-06-11 DIAGNOSIS — M545 Low back pain: Secondary | ICD-10-CM | POA: Diagnosis not present

## 2015-06-11 DIAGNOSIS — E785 Hyperlipidemia, unspecified: Secondary | ICD-10-CM | POA: Diagnosis not present

## 2015-06-11 DIAGNOSIS — N2889 Other specified disorders of kidney and ureter: Secondary | ICD-10-CM | POA: Diagnosis not present

## 2015-06-11 DIAGNOSIS — Z808 Family history of malignant neoplasm of other organs or systems: Secondary | ICD-10-CM | POA: Insufficient documentation

## 2015-06-11 DIAGNOSIS — Z79899 Other long term (current) drug therapy: Secondary | ICD-10-CM | POA: Insufficient documentation

## 2015-06-11 DIAGNOSIS — Z8249 Family history of ischemic heart disease and other diseases of the circulatory system: Secondary | ICD-10-CM | POA: Insufficient documentation

## 2015-06-11 DIAGNOSIS — Z823 Family history of stroke: Secondary | ICD-10-CM | POA: Diagnosis not present

## 2015-06-11 DIAGNOSIS — Z9852 Vasectomy status: Secondary | ICD-10-CM | POA: Diagnosis not present

## 2015-06-11 DIAGNOSIS — Z801 Family history of malignant neoplasm of trachea, bronchus and lung: Secondary | ICD-10-CM | POA: Diagnosis not present

## 2015-06-11 DIAGNOSIS — K219 Gastro-esophageal reflux disease without esophagitis: Secondary | ICD-10-CM | POA: Insufficient documentation

## 2015-06-11 DIAGNOSIS — R339 Retention of urine, unspecified: Secondary | ICD-10-CM

## 2015-06-11 DIAGNOSIS — N200 Calculus of kidney: Secondary | ICD-10-CM | POA: Insufficient documentation

## 2015-06-11 HISTORY — PX: CYSTOSCOPY WITH STENT PLACEMENT: SHX5790

## 2015-06-11 HISTORY — PX: URETEROSCOPY WITH HOLMIUM LASER LITHOTRIPSY: SHX6645

## 2015-06-11 LAB — URINE CULTURE: CULTURE: NO GROWTH

## 2015-06-11 SURGERY — URETEROSCOPY, WITH LITHOTRIPSY USING HOLMIUM LASER
Anesthesia: General | Laterality: Right | Wound class: Clean Contaminated

## 2015-06-11 MED ORDER — PROMETHAZINE HCL 25 MG/ML IJ SOLN
6.2500 mg | INTRAMUSCULAR | Status: DC | PRN
  Administered 2015-06-11: 12.5 mg via INTRAVENOUS

## 2015-06-11 MED ORDER — PROPOFOL 10 MG/ML IV BOLUS
INTRAVENOUS | Status: DC | PRN
  Administered 2015-06-11: 200 mg via INTRAVENOUS

## 2015-06-11 MED ORDER — LACTATED RINGERS IV SOLN
INTRAVENOUS | Status: DC
  Administered 2015-06-11: 11:00:00 via INTRAVENOUS

## 2015-06-11 MED ORDER — ROCURONIUM BROMIDE 100 MG/10ML IV SOLN
INTRAVENOUS | Status: DC | PRN
  Administered 2015-06-11: 10 mg via INTRAVENOUS
  Administered 2015-06-11: 20 mg via INTRAVENOUS

## 2015-06-11 MED ORDER — CEPHALEXIN 500 MG PO CAPS: 500.0000 mg | ORAL_CAPSULE | Freq: Three times a day (TID) | ORAL | Status: DC

## 2015-06-11 MED ORDER — PROMETHAZINE HCL 25 MG/ML IJ SOLN
INTRAMUSCULAR | Status: AC
  Filled 2015-06-11: qty 1

## 2015-06-11 MED ORDER — DEXAMETHASONE SODIUM PHOSPHATE 10 MG/ML IJ SOLN
INTRAMUSCULAR | Status: DC | PRN
  Administered 2015-06-11: 10 mg via INTRAVENOUS

## 2015-06-11 MED ORDER — BELLADONNA ALKALOIDS-OPIUM 16.2-60 MG RE SUPP
1.0000 | Freq: Once | RECTAL | Status: AC
Start: 2015-06-11 — End: 2015-06-11
  Administered 2015-06-11: 1 via RECTAL

## 2015-06-11 MED ORDER — HYDROMORPHONE HCL 2 MG PO TABS
2.0000 mg | ORAL_TABLET | Freq: Two times a day (BID) | ORAL | Status: DC | PRN
Start: 2015-06-11 — End: 2015-06-26

## 2015-06-11 MED ORDER — NEOSTIGMINE METHYLSULFATE 10 MG/10ML IV SOLN
INTRAVENOUS | Status: DC | PRN
  Administered 2015-06-11: 3 mg via INTRAVENOUS

## 2015-06-11 MED ORDER — ONDANSETRON HCL 4 MG/2ML IJ SOLN
INTRAMUSCULAR | Status: DC | PRN
  Administered 2015-06-11: 4 mg via INTRAVENOUS

## 2015-06-11 MED ORDER — CEFAZOLIN SODIUM-DEXTROSE 2-3 GM-% IV SOLR
INTRAVENOUS | Status: AC
  Administered 2015-06-11: 2000 mg
  Filled 2015-06-11: qty 50

## 2015-06-11 MED ORDER — FENTANYL CITRATE (PF) 100 MCG/2ML IJ SOLN
INTRAMUSCULAR | Status: DC | PRN
  Administered 2015-06-11 (×2): 50 ug via INTRAVENOUS

## 2015-06-11 MED ORDER — SODIUM CHLORIDE 0.9 % IJ SOLN
INTRAMUSCULAR | Status: AC
  Filled 2015-06-11: qty 10

## 2015-06-11 MED ORDER — FENTANYL CITRATE (PF) 100 MCG/2ML IJ SOLN
25.0000 ug | INTRAMUSCULAR | Status: DC | PRN
  Administered 2015-06-11 (×2): 50 ug via INTRAVENOUS

## 2015-06-11 MED ORDER — LIDOCAINE HCL (PF) 1 % IJ SOLN
INTRAMUSCULAR | Status: AC
  Filled 2015-06-11: qty 2

## 2015-06-11 MED ORDER — MIDAZOLAM HCL 2 MG/2ML IJ SOLN
INTRAMUSCULAR | Status: DC | PRN
  Administered 2015-06-11: 2 mg via INTRAVENOUS

## 2015-06-11 MED ORDER — OXYBUTYNIN CHLORIDE 5 MG PO TABS
5.0000 mg | ORAL_TABLET | Freq: Once | ORAL | Status: AC
  Administered 2015-06-11: 5 mg via ORAL
  Filled 2015-06-11: qty 1

## 2015-06-11 MED ORDER — SODIUM CHLORIDE 0.9 % IV SOLN
INTRAVENOUS | Status: DC | PRN
  Administered 2015-06-11: 14:00:00

## 2015-06-11 MED ORDER — SUCCINYLCHOLINE CHLORIDE 20 MG/ML IJ SOLN
INTRAMUSCULAR | Status: DC | PRN
  Administered 2015-06-11: 80 mg via INTRAVENOUS
  Administered 2015-06-11: 100 mg via INTRAVENOUS

## 2015-06-11 MED ORDER — EPHEDRINE SULFATE 50 MG/ML IJ SOLN
INTRAMUSCULAR | Status: DC | PRN
  Administered 2015-06-11: 10 mg via INTRAVENOUS

## 2015-06-11 MED ORDER — LIDOCAINE HCL (CARDIAC) 20 MG/ML IV SOLN
INTRAVENOUS | Status: DC | PRN
  Administered 2015-06-11: 100 mg via INTRAVENOUS

## 2015-06-11 MED ORDER — FENTANYL CITRATE (PF) 100 MCG/2ML IJ SOLN
INTRAMUSCULAR | Status: AC
  Filled 2015-06-11: qty 2

## 2015-06-11 MED ORDER — CEFAZOLIN SODIUM-DEXTROSE 2-4 GM/100ML-% IV SOLN
2.0000 g | Freq: Once | INTRAVENOUS | Status: AC
  Administered 2015-06-11: 2 g via INTRAVENOUS
  Filled 2015-06-11: qty 100

## 2015-06-11 MED ORDER — GLYCOPYRROLATE 0.2 MG/ML IJ SOLN
INTRAMUSCULAR | Status: DC | PRN
  Administered 2015-06-11: 0.6 mg via INTRAVENOUS
  Administered 2015-06-11: 0.2 mg via INTRAVENOUS

## 2015-06-11 MED ORDER — BELLADONNA ALKALOIDS-OPIUM 16.2-60 MG RE SUPP
RECTAL | Status: AC
Start: 2015-06-11 — End: 2015-06-11
  Administered 2015-06-11: 1 via RECTAL
  Filled 2015-06-11: qty 1

## 2015-06-11 SURGICAL SUPPLY — 37 items
BACTOSHIELD CHG 4% 4OZ (MISCELLANEOUS) ×1
BASKET 3WIRE GEMINI 2.4X120 (MISCELLANEOUS) ×4 IMPLANT
BASKET STONE 3X4X90X20 (MISCELLANEOUS) IMPLANT
BASKET ZERO TIP 1.9FR (BASKET) ×8 IMPLANT
CATH URETERAL DUAL LUMEN 10F (MISCELLANEOUS) ×4 IMPLANT
CATH URETL 5X70 OPEN END (CATHETERS) ×4 IMPLANT
CNTNR SPEC 2.5X3XGRAD LEK (MISCELLANEOUS) ×3
CONT SPEC 4OZ STER OR WHT (MISCELLANEOUS) ×1
CONTAINER SPEC 2.5X3XGRAD LEK (MISCELLANEOUS) ×3 IMPLANT
FEE TECHNICIAN ONLY PER HOUR (MISCELLANEOUS) IMPLANT
FORCEPS BIOP PIRANHA Y (CUTTING FORCEPS) ×4 IMPLANT
GLOVE BIO SURGEON STRL SZ7 (GLOVE) ×8 IMPLANT
GLOVE BIO SURGEON STRL SZ7.5 (GLOVE) ×4 IMPLANT
GOWN L4 LG 24 PK N/S (GOWN DISPOSABLE) ×4 IMPLANT
GOWN STRL REUS W/ TWL LRG LVL4 (GOWN DISPOSABLE) ×3 IMPLANT
GOWN STRL REUS W/ TWL XL LVL3 (GOWN DISPOSABLE) ×3 IMPLANT
GOWN STRL REUS W/TWL LRG LVL4 (GOWN DISPOSABLE) ×1
GOWN STRL REUS W/TWL XL LVL3 (GOWN DISPOSABLE) ×5 IMPLANT
GUIDEWIRE SUPER STIFF (WIRE) IMPLANT
KIT RM TURNOVER CYSTO AR (KITS) ×4 IMPLANT
LASER FIBER 200M SMARTSCOPE (Laser) ×8 IMPLANT
LASER HOLMIUM FIBER SU 272UM (MISCELLANEOUS) IMPLANT
PACK CYSTO AR (MISCELLANEOUS) ×4 IMPLANT
SCRUB CHG 4% DYNA-HEX 4OZ (MISCELLANEOUS) ×3 IMPLANT
SENSORWIRE 0.038 NOT ANGLED (WIRE) ×8
SET CYSTO W/LG BORE CLAMP LF (SET/KITS/TRAYS/PACK) ×4 IMPLANT
SHEATH URETERAL 12FR 45CM (SHEATH) ×4 IMPLANT
SHEATH URETERAL 13/15X36 1L (SHEATH) IMPLANT
SOL .9 NS 3000ML IRR  AL (IV SOLUTION) ×1
SOL .9 NS 3000ML IRR UROMATIC (IV SOLUTION) ×3 IMPLANT
STENT URET 6FRX24 CONTOUR (STENTS) ×4 IMPLANT
STENT URET 6FRX26 CONTOUR (STENTS) ×4 IMPLANT
STENT URET 6FRX28 CONTOUR (STENTS) ×4 IMPLANT
SURGILUBE 2OZ TUBE FLIPTOP (MISCELLANEOUS) ×4 IMPLANT
SYRINGE IRR TOOMEY STRL 70CC (SYRINGE) ×4 IMPLANT
WATER STERILE IRR 1000ML POUR (IV SOLUTION) ×4 IMPLANT
WIRE SENSOR 0.038 NOT ANGLED (WIRE) ×6 IMPLANT

## 2015-06-11 NOTE — OR Nursing (Signed)
Bladder scanner results 15 ml urine in bladder at 1620.

## 2015-06-11 NOTE — OR Nursing (Signed)
Pt presents to post op via a stretcher, I wish I could pee, pt tried in a sitting position, then a standing position with no luck.

## 2015-06-11 NOTE — OR Nursing (Signed)
Dr. Pilar Jarvis in to check patient and stated his abdomen felt okay to him

## 2015-06-11 NOTE — Op Note (Addendum)
Date of procedure: 06/11/2015  Preoperative diagnosis:  1. Right renal lesion 2. Left nonobstructing renal stone   Postoperative diagnosis:  1. Right renal lesion likely RCC 2. Left nonobstructing renal stone   Procedure: 1. Cystoscopy 2. Bilateral ureteroscopy 3. Left laser lithotripsy 4. Left retrograde pyelogram 5. Stone basketing 6. Left ureteral stent placement 6 French by 28 cm  Surgeon: Baruch Gouty, MD  Anesthesia: General  Complications: None  Intraoperative findingsthe patient had no evidence of transitional cell carcinoma in the right renal pelvis. He had a stone in the left lower pole that was wedged in a diverticulum that a small portion of it was removed. I was unable to remove the rest the stone due to its location. Retrograde pyelogram on the left showed mild extravasation of contrast in the proximal ureter and no significant filling defects.  EBL: None  Specimens: Left renal stone  Drains: 6 French by 28 cm left double-J ureteral stent  Disposition: Stable to the postanesthesia care unit  Indication for procedure: The patient is a 60 y.o. male with a right renal lesion that is unclear if that is of parenchymal or collecting system origin who presents today for ureteroscopy of the right side for surgical planning. Also the left nonobstructing stone that we will address while he is here since he will soon have solitary kidney.  After reviewing the management options for treatment, the patient elected to proceed with the above surgical procedure(s). We have discussed the potential benefits and risks of the procedure, side effects of the proposed treatment, the likelihood of the patient achieving the goals of the procedure, and any potential problems that might occur during the procedure or recuperation. Informed consent has been obtained.  Description of procedure: The patient was met in the preoperative area. All risks, benefits, and indications of the procedure  were described in great detail. The patient consented to the procedure. Preoperative antibiotics were given. The patient was taken to the operative theater. General anesthesia was induced per the anesthesia service. The patient was then placed in the dorsal lithotomy position and prepped and draped in the usual sterile fashion. A preoperative timeout was called. A 21 French 30 cystoscope was inserted into the patient's bladder per urethra atraumatically. Pan cystoscopy was unremarkable. Right ureteral orifice was intubated with a dual lumen catheter and 2 sensor wires were placed at the level of the renal pelvis. The dual-lumen catheter and cystoscope were then removed. Over the sensor wires the flexible ureteroscope was placed up to level the renal pelvis under fluoroscopy. Pan nephroscopy was unremarkable. There is no evidence of transitional cell carcinoma of his right collecting system. This time we confirmed the mass that was seen and CT scan to be of renal parenchyma in origin. The same process was repeated on the left side up placing 2 sensor wires up to the renal pelvis. One sensor wires in the left a ureteral access sheath was then placed under fluoroscopy. Pan nephroscopy the left side revealed a left lower pole stone was very difficult to get to. It was wedged within a diverticulum and unable to remove the stone basket. Small fragments were broken off with laser lithotripsy and sent to pathology after being stone basket. After proximally one hour [place] unable to break up the stone is smaller pieces to remove it from its wedged in position and diverticulum. At this point I cannot break into small pieces and was able to basket so that attempts were aborted at this time. Retrograde polygrams  obtained showing mild showing position of contrast in the proximal ureter. The ureteral axis sheath was removed under direct visualization. There is some minor trauma to the proximal ureter likely explaining the contrast  extravasation from proximal ureter. There are no other defects noted in the ureter. Cystoscope was reassembled over the remaining sensor wire 6 French by 20 cm double-J ureteral stent was placed. Sensor wire was removed. The stent was confirmed to be necrotic location of the frozen the patient's renal pelvis on fluoroscopy necrosis in the patient's bladder under visualization. The patient's bladder was then drained is awoken from anesthesia and transferred stable condition to postanesthesia care unit.  Plan: The patient will follow-up in one week to discuss hand-assisted right laparoscopic nephrectomy. He'll need a CT thorax in the interim to readdress a lung nodule seen on chest x-ray. He will also need stent removal in the office next week.  Baruch Gouty, M.D.

## 2015-06-11 NOTE — H&P (View-Only) (Signed)
06/06/2015 4:11 PM   Nicholas Thompson 1955-11-14 [id number]  Referring provider: Owens Loffler, MD [facility] [address] [place], [place] [address]  Chief Complaint  Patient presents with  . Follow-up    renal mass     HPI: The patient is a 60 year old gentleman presents to discuss her right renal masses seen on CT scan for lower back pain. It is in the lower right pole and is concerning for malignancy however the study was without contrast. He also was shown to have bilateral nephrolithiasis with the largest being 8 mm and all nonobstructing. The patient has never had any urological problems before. He has no history of kidney stones prior to the CT scan. He denies hematuria, nocturia, frequency, urgency, or other urinary issues. This was a completely asymptomatic incidental finding.   The patient underwent a CT renal protocol which did show this mass to be 5.2 cm exophytic mass with possible anterior perirenal fat invasion. On my review, it is unclear where this originates whether from the lower pole of the collecting cell on the TCC versus an RCC. He also does have a 1 cm stone in the left lower lower pole of his kidney.    PMH: Past Medical History  Diagnosis Date  . Other and unspecified hyperlipidemia   . Unspecified essential hypertension   . History of colonic polyps   . Basal cell carcinoma     bASAL CELL    Surgical History: Past Surgical History  Procedure Laterality Date  . Arthroscopy of knee  1989  . Vasectomy    . Esophageal dilation  1998 & 2001&2006    3 times  . Shoulder arthroscopy w/ subacromial decompression and distal clavicle excision  08-27-2007  . Mohs surgery Left 04/2015    [facility], [facility]    Home Medications:    Medication List       This list is accurate as of: 06/06/15  4:11 PM.  Always use your most recent med list.               enalapril 20 MG tablet  Commonly known as:  VASOTEC  Take 2 tablets (40 mg total) by mouth  daily.     fluconazole 100 MG tablet  Commonly known as:  DIFLUCAN  Take 1 tablet (100 mg total) by mouth daily.     HYDROmorphone 2 MG tablet  Commonly known as:  DILAUDID  Take 1 tablet (2 mg total) by mouth every 12 (twelve) hours as needed for severe pain.     ketoconazole 2 % cream  Commonly known as:  NIZORAL  Apply 1 application topically daily.     LORazepam 1 MG tablet  Commonly known as:  ATIVAN  Take 1 tablet (1 mg total) by mouth every 8 (eight) hours as needed for anxiety.     multivitamin with minerals tablet  Take 1 tablet by mouth daily. Reported on 06/06/2015     omeprazole 20 MG capsule  Commonly known as:  PRILOSEC  TAKE ONE CAPSULE BY MOUTH ONCE DAILY     oxyCODONE 5 MG immediate release tablet  Commonly known as:  Oxy IR/ROXICODONE  Take 5 mg by mouth every 6 (six) hours as needed. Reported on 06/06/2015        Allergies: No Known Allergies  Family History: Family History  Problem Relation Age of Onset  . Cancer Mother     bladder  . Diabetes Father   . Hyperlipidemia Father   .  Coronary artery disease Father   . Cancer Paternal Uncle     brain  . Stroke Maternal Grandfather   . Cancer Paternal Grandfather     lung  . Prostate cancer Neg Hx     Social History:  reports that he has never smoked. He has never used smokeless tobacco. He reports that he drinks alcohol. He reports that he does not use illicit drugs.  ROS: UROLOGY Frequent Urination?: No Hard to postpone urination?: No Burning/pain with urination?: No Get up at night to urinate?: Yes Leakage of urine?: No Urine stream starts and stops?: No Trouble starting stream?: No Do you have to strain to urinate?: No Blood in urine?: No Urinary tract infection?: No Sexually transmitted disease?: No Injury to kidneys or bladder?: No Painful intercourse?: No Weak stream?: No Erection problems?: No Penile pain?: No  Gastrointestinal Nausea?: No Vomiting?:  No Indigestion/heartburn?: No Diarrhea?: No Constipation?: No  Constitutional Fever: No Night sweats?: No Weight loss?: No Fatigue?: No  Skin Skin rash/lesions?: No Itching?: No  Eyes Blurred vision?: No Double vision?: No  Ears/Nose/Throat Sore throat?: No Sinus problems?: No  Hematologic/Lymphatic Swollen glands?: No Easy bruising?: No  Cardiovascular Leg swelling?: No Chest pain?: No  Respiratory Cough?: No Shortness of breath?: No  Endocrine Excessive thirst?: No  Musculoskeletal Back pain?: No Joint pain?: No  Neurological Headaches?: No Dizziness?: No  Psychologic Depression?: No Anxiety?: No  Physical Exam: BP 157/83 mmHg  Pulse 66  Ht 6\' 2"  (1.88 m)  Wt 245 lb 14.4 oz (111.54 kg)  BMI 31.56 kg/m2  Constitutional:  Alert and oriented, No acute distress. HEENT: [place] AT, moist mucus membranes.  Trachea midline, no masses. Cardiovascular: No clubbing, cyanosis, or edema. Respiratory: Normal respiratory effort, no increased work of breathing. GI: Abdomen is soft, nontender, nondistended, no abdominal masses GU: No CVA tenderness.  Skin: No rashes, bruises or suspicious lesions. Lymph: No cervical or inguinal adenopathy. Neurologic: Grossly intact, no focal deficits, moving all 4 extremities. Psychiatric: Normal mood and affect.  Laboratory Data: Lab Results  Component Value Date   WBC 8.4 05/11/2015   HGB 15.4 05/11/2015   HCT 44.4 05/11/2015   MCV 88.1 05/11/2015   PLT 276 05/11/2015    Lab Results  Component Value Date   CREATININE 0.64 05/11/2015    Lab Results  Component Value Date   PSA 1.21 01/28/2015   PSA 1.44 06/25/2013   PSA 1.40 06/26/2012    No results found for: TESTOSTERONE  Lab Results  Component Value Date   HGBA1C 5.2 01/28/2015    Urinalysis    Component Value Date/Time   APPEARANCEUR Clear 05/23/2015 1454   GLUCOSEU Negative 05/23/2015 1454   BILIRUBINUR Negative 05/23/2015 1454   PROTEINUR  Negative 05/23/2015 1454   NITRITE Negative 05/23/2015 1454   LEUKOCYTESUR Negative 05/23/2015 1454    Pertinent Imaging: CLINICAL DATA: Indeterminate incidental renal mass in the right lower kidney on recent unenhanced CT study.  EXAM: CT ABDOMEN AND PELVIS WITHOUT AND WITH CONTRAST  TECHNIQUE: Multidetector CT imaging of the abdomen and pelvis was performed following the standard protocol before and following the bolus administration of intravenous contrast.  CONTRAST: 126mL OMNIPAQUE IOHEXOL 350 MG/ML SOLN  COMPARISON: 05/11/2015 unenhanced CT abdomen/ pelvis.  FINDINGS: Lower chest: No significant pulmonary nodules or acute consolidative airspace disease.  Hepatobiliary: Mild-to-moderate diffuse hepatic steatosis. No liver mass. Normal gallbladder with no radiopaque cholelithiasis. No biliary ductal dilatation.  Pancreas: Normal, with no mass or duct dilation.  Spleen: Normal  size. No mass.  Adrenals/Urinary Tract: Normal adrenals. There is a 10 x 6 mm stone in the left lower renal collecting system, with overlying 1.9 x 1.5 cm nonenhancing cystic structure, which could represent a dilated calyx or caliceal diverticulum (series 5/image 60). There is a separate 3 mm nonobstructing stone in the left lower kidney. There is a nonobstructing 2 mm stone in the lower right kidney. No hydronephrosis. Normal caliber ureters. There is an avidly enhancing exophytic irregular 4.6 x 3.9 x 5.2 cm renal mass in the anterior lower right kidney (series 9/ image 69), which demonstrates coarse central calcifications and invasion of the anterior perinephric fat, without definite invasion beyond Gerota's fascia. No evidence of renal sinus invasion. Collapsed and grossly normal bladder.  Stomach/Bowel: Grossly normal stomach. Normal caliber small bowel with no small bowel wall thickening. Normal appendix. Normal large bowel with no diverticulosis, large bowel wall  thickening or pericolonic fat stranding.  Vascular/Lymphatic: Atherosclerotic nonaneurysmal abdominal aorta. Patent portal, splenic, hepatic and renal veins. No pathologically enlarged lymph nodes in the abdomen or pelvis.  Reproductive: Mild prostatomegaly.  Other: No pneumoperitoneum, ascites or focal fluid collection. Mild mistiness of the central mesentery without adenopathy, unchanged since 05/11/2015, nonspecific, probably representing mild mesenteric panniculitis.  Musculoskeletal: No aggressive appearing focal osseous lesions. Marked degenerative changes in the visualized thoracolumbar spine. Right paramedian lower back sebaceous cyst measuring 2.2 cm.  IMPRESSION: 1. Irregular avidly enhancing exophytic 4.6 x 3.9 x 5.2 cm renal mass in the anterior lower right kidney, most consistent with a clear cell renal cell carcinoma. Anterior perinephric fat invasion without definite invasion beyond Gerota's fascia. No renal sinus invasion. Patent renal veins without tumor thrombus. 2. Dilated calyx versus calyceal diverticulum associated with a 10 x 6 mm stone in the lower left renal collecting system. Additional tiny nonobstructing stones in both lower kidneys. 3. No evidence of metastatic disease in the abdomen or pelvis. 4. Mild-to-moderate diffuse hepatic steatosis. 5. Mild prostatomegaly.  Assessment & Plan:   1. Right renal mass-TCC versus RCC 2. Left 1 cm lower pole calculus  I had a long conversation with the patient and his family regarding his diagnosis of a right renal mass as well as a left lower pole calculus. On my review of the images, I am unsure if this is a TCC versus RCC in origin. I explained to the patient the difference in the two diagnoses. RCC will require a nephrectomy while TCC would requrie nephroureterectomy which is a more extensive surgery. I feel the best option at this time would be to perform right ureteroscopy with visualization of his right  collecting system to ensure this is not a TCC. If we're unsure at that time, we will take a biopsy if there is suspicious tissue to determine if this is TCC or RCC. If I find no evidence of renal pelvis invasion, I think it would be a good idea during the same procedure to perform left ureteroscopy in order to address his stone burden in his left kidney as it will become a solitary kidney after treating this tumor. I discussed the risks, benefits, and indications of both procedures. He understands risks include but are not limited to bleeding and infection. He also knows that there is a chance it may not be able to reach the desired area during the first procedure. He understands that he will likely need a lateral ureteral stents for this procedure relation. All questions were answered. The patient elected to proceed.    Return  for surgery.  [name], MD  [facility] [address] [place], [place] [address] [phone]

## 2015-06-11 NOTE — Transfer of Care (Signed)
Immediate Anesthesia Transfer of Care Note  Patient: Nicholas Thompson  Procedure(s) Performed: Procedure(s): URETEROSCOPY WITH HOLMIUM LASER LITHOTRIPSY (Bilateral) CYSTOSCOPY WITH STENT PLACEMENT/ bilateral retrograde pyelogram (Left)  Patient Location: PACU  Anesthesia Type:General  Level of Consciousness: awake  Airway & Oxygen Therapy: Patient Spontanous Breathing and Patient connected to face mask oxygen  Post-op Assessment: Report given to RN and Post -op Vital signs reviewed and stable  Post vital signs: stable  Last Vitals:  Filed Vitals:   06/11/15 1108 06/11/15 1421  BP: 169/76 156/82  Pulse: 68 75  Temp: 36.1 C 36.2 C  Resp: 16 12    Complications: No apparent anesthesia complications

## 2015-06-11 NOTE — Interval H&P Note (Signed)
History and Physical Interval Note:  06/11/2015 12:32 PM  Nicholas Thompson  has presented today for surgery, with the diagnosis of renal mass,nephrolithiasis  The various methods of treatment have been discussed with the patient and family. After consideration of risks, benefits and other options for treatment, the patient has consented to  Procedure(s): URETEROSCOPY WITH HOLMIUM LASER LITHOTRIPSY (Bilateral) CYSTOSCOPY WITH BIOPSY/ RENAL BIOPSY (Right) CYSTOSCOPY WITH STENT PLACEMENT (Bilateral) as a surgical intervention .  The patient's history has been reviewed, patient examined, no change in status, stable for surgery.  I have reviewed the patient's chart and labs.  Questions were answered to the patient's satisfaction.    RRR Unlabored respirations  Horald Pollen [place]

## 2015-06-11 NOTE — Telephone Encounter (Signed)
-----   Message from [name], MD sent at 06/11/2015  2:19 PM EDT ----- Mr immerman needs to f/u next week with me. Please schedule him just before lunch. Ok to [place] if need be.  He needs a CT thorax with contrast. Can we try to get that scheduled prior to his f/u? thanks

## 2015-06-11 NOTE — Anesthesia Preprocedure Evaluation (Addendum)
Anesthesia Evaluation  Patient identified by MRN, date of birth, ID band Patient awake    Reviewed: Allergy & Precautions, H&P , NPO status , Patient's Chart, lab work & pertinent test results, reviewed documented beta blocker date and time   History of Anesthesia Complications (+) PONV and history of anesthetic complications  Airway Mallampati: IV  TM Distance: >3 FB Neck ROM: full    Dental no notable dental hx. (+) Missing Permanent bridge on the top front right:   Pulmonary neg pulmonary ROS,    Pulmonary exam normal breath sounds clear to auscultation       Cardiovascular Exercise Tolerance: Good hypertension, (-) angina(-) CAD, (-) Past MI, (-) Cardiac Stents and (-) CABG Normal cardiovascular exam(-) dysrhythmias (-) Valvular Problems/Murmurs Rhythm:regular Rate:Normal     Neuro/Psych negative neurological ROS  negative psych ROS   GI/Hepatic Neg liver ROS, GERD  Medicated and Controlled,  Endo/Other  negative endocrine ROS  Renal/GU CRFRenal disease (kidney stones)  negative genitourinary   Musculoskeletal   Abdominal   Peds  Hematology negative hematology ROS (+)   Anesthesia Other Findings Past Medical History:   Other and unspecified hyperlipidemia                         Unspecified essential hypertension                           History of colonic polyps                                    Basal cell carcinoma                                           Comment:bASAL CELL   GERD (gastroesophageal reflux disease)                       Chronic kidney disease                                       Arthritis                                                    Nephrolithiasis                                              Reproductive/Obstetrics negative OB ROS                            Anesthesia Physical Anesthesia Plan  ASA: III  Anesthesia Plan: General   Post-op Pain  Management:    Induction:   Airway Management Planned:   Additional Equipment:   Intra-op Plan:   Post-operative Plan:   Informed Consent: I have reviewed the patients History and Physical, chart, labs and discussed the procedure including the risks, benefits and alternatives for the  proposed anesthesia with the patient or authorized representative who has indicated his/her understanding and acceptance.   Dental Advisory Given  Plan Discussed with: Anesthesiologist, CRNA and Surgeon  Anesthesia Plan Comments:         Anesthesia Quick Evaluation

## 2015-06-11 NOTE — Telephone Encounter (Signed)
-----   Message from [name], MD sent at 06/11/2015  2:24 PM EDT ----- Patient also needs cysto/stent removal at the same time

## 2015-06-11 NOTE — Anesthesia Procedure Notes (Signed)
Procedure Name: Intubation Date/Time: 06/11/2015 1:08 PM Performed by: Aline Brochure Pre-anesthesia Checklist: Patient identified, Emergency Drugs available, Suction available and Patient being monitored Patient Re-evaluated:Patient Re-evaluated prior to inductionOxygen Delivery Method: Circle system utilized Preoxygenation: Pre-oxygenation with 100% oxygen Intubation Type: IV induction Ventilation: Oral airway inserted - appropriate to patient size Laryngoscope Size: Glidescope and 4 Grade View: Grade I Tube type: Oral Tube size: 7.0 mm Number of attempts: 2 Airway Equipment and Method: Patient positioned with wedge pillow,  Rigid stylet and Video-laryngoscopy Placement Confirmation: ETT inserted through vocal cords under direct vision,  positive ETCO2 and breath sounds checked- equal and bilateral Secured at: 22 cm Tube secured with: Tape Dental Injury: Teeth and Oropharynx as per pre-operative assessment  Difficulty Due To: Difficulty was anticipated

## 2015-06-11 NOTE — Discharge Instructions (Signed)
AMBULATORY SURGERY  DISCHARGE INSTRUCTIONS   1) The drugs that you were given will stay in your system until tomorrow so for the next 24 hours you should not:  A) Drive an automobile B) Make any legal decisions C) Drink any alcoholic beverage   2) You may resume regular meals tomorrow.  Today it is better to start with liquids and gradually work up to solid foods.  You may eat anything you prefer, but it is better to start with liquids, then soup and crackers, and gradually work up to solid foods.   3) Please notify your doctor immediately if you have any unusual bleeding, trouble breathing, redness and pain at the surgery site, drainage, fever, or pain not relieved by medication.    4) Additional Instructions:        Please contact your physician with any problems or Same Day Surgery at [phone], Monday through Friday 6 am to 4 pm, or [place] at [facility] number at [phone].SH:[phone]

## 2015-06-12 ENCOUNTER — Other Ambulatory Visit: Payer: Self-pay | Admitting: Urology

## 2015-06-12 ENCOUNTER — Encounter: Payer: Self-pay | Admitting: Urology

## 2015-06-12 DIAGNOSIS — N2889 Other specified disorders of kidney and ureter: Secondary | ICD-10-CM

## 2015-06-12 DIAGNOSIS — R911 Solitary pulmonary nodule: Secondary | ICD-10-CM

## 2015-06-12 NOTE — Anesthesia Postprocedure Evaluation (Signed)
Anesthesia Post Note  Patient: Nicholas Thompson  Procedure(s) Performed: Procedure(s) (LRB): URETEROSCOPY WITH HOLMIUM LASER LITHOTRIPSY (Bilateral) CYSTOSCOPY WITH STENT PLACEMENT/ bilateral retrograde pyelogram (Left)  Patient location during evaluation: PACU Anesthesia Type: General Level of consciousness: awake and alert Pain management: pain level controlled Vital Signs Assessment: post-procedure vital signs reviewed and stable Respiratory status: spontaneous breathing and respiratory function stable Cardiovascular status: stable Anesthetic complications: no    Last Vitals:  Filed Vitals:   06/11/15 1653 06/11/15 1752  BP: 137/70 106/86  Pulse: 67 79  Temp:    Resp: 20 20    Last Pain:  Filed Vitals:   06/11/15 1800  PainSc: 5                  Paizlie Klaus K

## 2015-06-13 NOTE — Telephone Encounter (Signed)
done

## 2015-06-16 ENCOUNTER — Telehealth: Payer: Self-pay | Admitting: Radiology

## 2015-06-16 ENCOUNTER — Telehealth: Payer: Self-pay

## 2015-06-16 NOTE — Telephone Encounter (Signed)
The pt called complaining of night sweats with some chills that he contributes to being wet after waking.Marland Kitchen He did report some constipation x 2 days, but subsided after stopping pain medication. No fever his Temp is  98.7. He stated he went to work this morning, but just doesn't feel himself. He had surgery x 5 days ago Ureteroscopy with w/ CYSTOSCOPY WITH STENT PLACEMENT/ bilateral retrograde pyelogram by Dr.Budzyn. I spoke w/ Dr. Matilde Sprang. He recommended that the pt go to the ER, because he can get STAT imagining and blood work. The pt was notified and he stated that he was going to give it another hour and if he doesn't get to feeling better he will go to the ER.

## 2015-06-16 NOTE — Telephone Encounter (Signed)
Notified pt of CT scheduled 3/29 @3 :15 & cysto with stent removal on 3/30 @4 :00. Also notified pt of surgery scheduled 07/09/15 & pre-admit testing appt on 06/24/15 @8 :15. Advised pt to call day prior to surgery for arrival time to SDS. Pt voices understanding.

## 2015-06-18 ENCOUNTER — Ambulatory Visit
Admission: RE | Admit: 2015-06-18 | Discharge: 2015-06-18 | Disposition: A | Discharge status: Home / Self Care | Payer: BLUE CROSS/BLUE SHIELD | Source: Ambulatory Visit | Attending: Urology | Admitting: Urology

## 2015-06-18 DIAGNOSIS — N2889 Other specified disorders of kidney and ureter: Secondary | ICD-10-CM

## 2015-06-18 DIAGNOSIS — R911 Solitary pulmonary nodule: Secondary | ICD-10-CM

## 2015-06-18 DIAGNOSIS — I712 Thoracic aortic aneurysm, without rupture: Secondary | ICD-10-CM | POA: Insufficient documentation

## 2015-06-18 DIAGNOSIS — R918 Other nonspecific abnormal finding of lung field: Secondary | ICD-10-CM | POA: Diagnosis present

## 2015-06-18 LAB — STONE ANALYSIS
CA PHOS CRY STONE QL IR: 3 %
Ca Oxalate,Dihydrate: 25 %
Ca Oxalate,Monohydr.: 72 %
Stone Weight KSTONE: 13.1 mg

## 2015-06-19 ENCOUNTER — Encounter: Payer: Self-pay | Admitting: Urology

## 2015-06-19 ENCOUNTER — Ambulatory Visit (INDEPENDENT_AMBULATORY_CARE_PROVIDER_SITE_OTHER): Payer: BLUE CROSS/BLUE SHIELD | Admitting: Urology

## 2015-06-19 VITALS — BP 146/81 | HR 66 | Temp 99.0°F | Ht 74.0 in | Wt 243.2 lb

## 2015-06-19 DIAGNOSIS — N200 Calculus of kidney: Secondary | ICD-10-CM | POA: Diagnosis not present

## 2015-06-19 DIAGNOSIS — D49519 Neoplasm of unspecified behavior of unspecified kidney: Secondary | ICD-10-CM

## 2015-06-19 LAB — MICROSCOPIC EXAMINATION: RBC, UA: >30 /hpf — ABNORMAL HIGH (ref 0–?)

## 2015-06-19 LAB — URINALYSIS, COMPLETE
Bilirubin, UA: NEGATIVE
GLUCOSE, UA: NEGATIVE
Ketones, UA: NEGATIVE
Nitrite, UA: NEGATIVE
UUROB: 0.2 mg/dL (ref 0.2–1.0)
pH, UA: 5.5 (ref 5.0–7.5)

## 2015-06-19 MED ORDER — CIPROFLOXACIN HCL 500 MG PO TABS
500.0000 mg | ORAL_TABLET | Freq: Once | ORAL | Status: AC
  Administered 2015-06-19: 500 mg via ORAL

## 2015-06-19 MED ORDER — LIDOCAINE HCL 2 % EX GEL
1.0000 "application " | Freq: Once | CUTANEOUS | Status: AC
  Administered 2015-06-19: 1 via URETHRAL

## 2015-06-19 NOTE — Progress Notes (Signed)
06/19/2015 4:12 PM   Nicholas Thompson 1955-06-29 [name]  Referring provider: Owens Loffler, MD [facility] [address] [place], [place] [address]  Chief Complaint  Patient presents with  . Routine Post Op    cysto/stent removal    HPI: The patient is a 60 year old gentleman presents to discuss her right renal masses seen on CT scan for lower back pain. It is in the lower right pole and is concerning for malignancy however the study was without contrast. He also was shown to have bilateral nephrolithiasis with the largest being 8 mm and all nonobstructing. The patient has never had any urological problems before. He has no history of kidney stones prior to the CT scan. He denies hematuria, nocturia, frequency, urgency, or other urinary issues. This was a completely asymptomatic incidental finding.   The patient underwent a CT renal protocol which did show this mass to be 5.2 cm exophytic mass with possible anterior perirenal fat invasion. On my review, it is unclear where this originates whether from the lower pole of the collecting cell on the TCC versus an RCC. He also does have a 1 cm stone in the left lower lower pole of his kidney.   The patient underwent ureteroscopy and the right which confirmed the mass to be RCC and not TCC. A part of a left lower pole stone removed at that time. There were still some fragments left in the left lower pole are unable to remove or access. He is a left ureteral stent placed at that time. His urinalysis today suspicious for infection. She also underwent a CT thorax which showed no irregularities or nodules.   PMH: Past Medical History  Diagnosis Date  . Other and unspecified hyperlipidemia   . Unspecified essential hypertension   . History of colonic polyps   . Basal cell carcinoma     bASAL CELL  . GERD (gastroesophageal reflux disease)   . Chronic kidney disease   . Arthritis   . Nephrolithiasis     Surgical  History: Past Surgical History  Procedure Laterality Date  . Arthroscopy of knee Right 1989  . Vasectomy    . Esophageal dilation  1998 & 2001&2006    3 times  . Shoulder arthroscopy w/ subacromial decompression and distal clavicle excision Left 08-27-2007  . Mohs surgery Left 04/2015    [facility]  . Ureteroscopy with holmium laser lithotripsy Bilateral 06/11/2015    Procedure: URETEROSCOPY WITH HOLMIUM LASER LITHOTRIPSY;  Surgeon: [name], MD;  Location: [facility] ORS;  Service: Urology;  Laterality: Bilateral;  . Cystoscopy with stent placement Left 06/11/2015    Procedure: CYSTOSCOPY WITH STENT PLACEMENT/ bilateral retrograde pyelogram;  Surgeon: [name], MD;  Location: [facility] ORS;  Service: Urology;  Laterality: Left;    Home Medications:    Medication List       This list is accurate as of: 06/19/15  4:12 PM.  Always use your most recent med list.               cephALEXin 500 MG capsule  Commonly known as:  KEFLEX  Take 1 capsule (500 mg total) by mouth 3 (three) times daily.     enalapril 20 MG tablet  Commonly known as:  VASOTEC  Take 2 tablets (40 mg total) by mouth daily.     fluconazole 100 MG tablet  Commonly known as:  DIFLUCAN  Take 1 tablet (100 mg total) by mouth daily.  HYDROmorphone 2 MG tablet  Commonly known as:  DILAUDID  Take 1 tablet (2 mg total) by mouth every 12 (twelve) hours as needed for severe pain.     ketoconazole 2 % cream  Commonly known as:  NIZORAL  Apply 1 application topically daily.     LORazepam 1 MG tablet  Commonly known as:  ATIVAN  Take 1 tablet (1 mg total) by mouth every 8 (eight) hours as needed for anxiety.     omeprazole 20 MG capsule  Commonly known as:  PRILOSEC  TAKE ONE CAPSULE BY MOUTH ONCE DAILY        Allergies: No Known Allergies  Family History: Family History  Problem Relation Age of Onset  . Cancer Mother     bladder  . Diabetes Father   . Hyperlipidemia Father   . Coronary artery  disease Father   . Cancer Paternal Uncle     brain  . Stroke Maternal Grandfather   . Cancer Paternal Grandfather     lung  . Prostate cancer Neg Hx     Social History:  reports that he has never smoked. He has never used smokeless tobacco. He reports that he drinks alcohol. He reports that he does not use illicit drugs.  ROS:                                        Physical Exam: BP 146/81 mmHg  Pulse 66  Temp(Src) 99 F (37.2 C) (Oral)  Ht 6\' 2"  (1.88 m)  Wt 243 lb 3.2 oz (110.315 kg)  BMI 31.21 kg/m2  Constitutional:  Alert and oriented, No acute distress. HEENT: [place] AT, moist mucus membranes.  Trachea midline, no masses. Cardiovascular: No clubbing, cyanosis, or edema. Respiratory: Normal respiratory effort, no increased work of breathing. GI: Abdomen is soft, nontender, nondistended, no abdominal masses GU: No CVA tenderness.  Skin: No rashes, bruises or suspicious lesions. Lymph: No cervical or inguinal adenopathy. Neurologic: Grossly intact, no focal deficits, moving all 4 extremities. Psychiatric: Normal mood and affect.  Laboratory Data: Lab Results  Component Value Date   WBC 9.3 06/09/2015   HGB 16.4 06/09/2015   HCT 47.8 06/09/2015   MCV 88.4 06/09/2015   PLT 249 06/09/2015    Lab Results  Component Value Date   CREATININE 0.81 06/09/2015    Lab Results  Component Value Date   PSA 1.21 01/28/2015   PSA 1.44 06/25/2013   PSA 1.40 06/26/2012    No results found for: TESTOSTERONE  Lab Results  Component Value Date   HGBA1C 5.2 01/28/2015    Urinalysis    Component Value Date/Time   APPEARANCEUR Clear 05/23/2015 1454   GLUCOSEU Negative 05/23/2015 1454   BILIRUBINUR Negative 05/23/2015 1454   PROTEINUR Negative 05/23/2015 1454   NITRITE Negative 05/23/2015 1454   LEUKOCYTESUR Negative 05/23/2015 1454    Pertinent Imaging: CLINICAL DATA: Followup abnormal chest radiograph with question lung nodule at RIGHT  base  EXAM: CT CHEST WITHOUT CONTRAST  TECHNIQUE: Multidetector CT imaging of the chest was performed following the standard protocol without IV contrast.  COMPARISON: 08/06/2009 ; correlation chest radiograph 06/09/2015  FINDINGS: Scattered atherosclerotic calcifications aorta and coronary arteries.  Ascending thoracic aorta mildly dilated 4.3 cm transverse image 27 previously 4.1 cm.  No thoracic adenopathy.  LEFT ureteral stent without obvious hydronephrosis.  Visualized upper abdomen otherwise unremarkable.  Lungs well expanded and  clear.  No pulmonary infiltrate, pleural effusion or pneumothorax.  No pulmonary mass or nodule.  Mild scattered degenerative disc disease changes thoracic spine.  IMPRESSION: No evidence of pulmonary nodule.  Question nodular density on previous exam likely either represented a nipple shadow or a summation artifact.  Mild aneurysmal dilatation ascending thoracic aorta 4.3 cm diameter, recommendation below.  Recommend annual imaging followup by CTA or MRA.  This recommendation follows 2010 ACCF/AHA/AATS/ACR/ASA/SCA/SCAI/SIR/STS/SVM Guidelines for the Diagnosis and Management of Patients with Thoracic Aortic Disease. Circulation. 2010; 121: [id number]  Assessment & Plan:    1. Right renal mass I had a long discussion with the patient regarding his right renal mass that is likely RCC. There are no signs of distal metastases at this time. We discussed a right hand-assisted laparoscopic nephrectomy. He understands the risks, benefits, and indications of this procedure. He understands this is an inpatient procedure with at least a 1 day hospital admission. He understands that he will have a Foley catheter after the procedure. He understands the risks include but are not limited to bleeding, blood transfusion, infection, bowel injury, vascular injury, surgical misadventure, anesthesia complications, DVT, PE, pneumonia, and  death. All the patient's questions were answered. He elects to proceed with the surgery.  2. Left nephrolithiasis status post ureteroscopy and stent placement The patient is a urinalysis concerning for infection. We will culture his urine. He'll follow-up next week for cystoscopy stent removal. I will like to have the stent removed prior to surgery to ensure the left kidney is draining properly as it will be a solitary kidney after above.  3. Mild dilation of ascending thoracic aorta Per the radiology report, it was recommended the patient undergo annual imaging follow-up by CTA or MRA. The patient was given a copy of this report instructed to follow-up with his primary care provider for further workup in one year. He voiced understanding.   Return in about 1 week (around 06/26/2015) for cysto stent removal.  [name], [place] [address] [place], [place] [address] [phone]

## 2015-06-21 LAB — CULTURE, URINE COMPREHENSIVE

## 2015-06-24 ENCOUNTER — Encounter
Admission: RE | Admit: 2015-06-24 | Discharge: 2015-06-24 | Disposition: A | Discharge status: Home / Self Care | Payer: BLUE CROSS/BLUE SHIELD | Source: Ambulatory Visit | Attending: Urology | Admitting: Urology

## 2015-06-24 DIAGNOSIS — Z01812 Encounter for preprocedural laboratory examination: Secondary | ICD-10-CM | POA: Diagnosis not present

## 2015-06-24 DIAGNOSIS — N200 Calculus of kidney: Secondary | ICD-10-CM | POA: Diagnosis not present

## 2015-06-24 DIAGNOSIS — N2889 Other specified disorders of kidney and ureter: Secondary | ICD-10-CM | POA: Insufficient documentation

## 2015-06-24 LAB — CBC
HEMATOCRIT: 42.4 % (ref 40.0–52.0)
Hemoglobin: 14.7 g/dL (ref 13.0–18.0)
MCH: 30.9 pg (ref 26.0–34.0)
MCHC: 34.7 g/dL (ref 32.0–36.0)
MCV: 89.2 fL (ref 80.0–100.0)
Platelets: 326 10*3/uL (ref 150–440)
RBC: 4.75 MIL/uL (ref 4.40–5.90)
RDW: 12.2 % (ref 11.5–14.5)
WBC: 9.4 10*3/uL (ref 3.8–10.6)

## 2015-06-24 LAB — BASIC METABOLIC PANEL
Anion gap: 6 (ref 5–15)
BUN: 10 mg/dL (ref 6–20)
CHLORIDE: 103 mmol/L (ref 101–111)
CO2: 29 mmol/L (ref 22–32)
CREATININE: 0.66 mg/dL (ref 0.61–1.24)
Calcium: 9.2 mg/dL (ref 8.9–10.3)
GFR calc non Af Amer: >60 mL/min (ref >60)
Glucose, Bld: 101 mg/dL — ABNORMAL HIGH (ref 65–99)
POTASSIUM: 3.8 mmol/L (ref 3.5–5.1)
Sodium: 138 mmol/L (ref 135–145)

## 2015-06-24 LAB — PROTIME-INR
INR: 0.97
PROTHROMBIN TIME: 13.1 s (ref 11.4–15.0)

## 2015-06-24 LAB — APTT: APTT: 29 s (ref 24–36)

## 2015-06-24 NOTE — Patient Instructions (Signed)
Your procedure is scheduled on: Wednesday 07/09/15 Report to Day Surgery. 2nd floor medical mall entrance To find out your arrival time please call [phone] between 1PM - 3PM on Tuesday 07/08/15.  Remember: Instructions that are not followed completely may result in serious medical risk, up to and including death, or upon the discretion of your surgeon and anesthesiologist your surgery may need to be rescheduled.    __X__ 1. Do not eat food or drink liquids after midnight. No gum chewing or hard candies.     __X__ 2. No Alcohol for 24 hours before or after surgery.   ____ 3. Bring all medications with you on the day of surgery if instructed.    __X__ 4. Notify your doctor if there is any change in your medical condition     (cold, fever, infections).     Do not wear jewelry, make-up, hairpins, clips or nail polish.  Do not wear lotions, powders, or perfumes.   Do not shave 48 hours prior to surgery. Men may shave face and neck.  Do not bring valuables to the hospital.    [facility] is not responsible for any belongings or valuables.               Contacts, dentures or bridgework may not be worn into surgery.  Leave your suitcase in the car. After surgery it may be brought to your room.  For patients admitted to the hospital, discharge time is determined by your                treatment team.   Patients discharged the day of surgery will not be allowed to drive home.   Please read over the following fact sheets that you were given:   Surgical Site Infection Prevention   __X__ Take these medicines the morning of surgery with A SIP OF WATER:    1. TAKE OMEPRAZOLE AT BEDTIME AND AGAIN THE MORNING OF YOUR SURGERY  2.   3.   4.  5.  6.  ____ Fleet Enema (as directed)   __X__ Use CHG Soap as directed  ____ Use inhalers on the day of surgery  ____ Stop metformin 2 days prior to surgery    ____ Take 1/2 of usual insulin dose the night before surgery and none on the morning  of surgery.   ____ Stop Coumadin/Plavix/aspirin on   ____ Stop Anti-inflammatories on    ____ Stop supplements until after surgery.    ____ Bring C-Pap to the hospital.

## 2015-06-26 ENCOUNTER — Ambulatory Visit (INDEPENDENT_AMBULATORY_CARE_PROVIDER_SITE_OTHER): Payer: BLUE CROSS/BLUE SHIELD | Admitting: Urology

## 2015-06-26 VITALS — BP 145/81 | HR 74 | Ht 74.0 in | Wt 245.7 lb

## 2015-06-26 DIAGNOSIS — N200 Calculus of kidney: Secondary | ICD-10-CM

## 2015-06-26 DIAGNOSIS — N2889 Other specified disorders of kidney and ureter: Secondary | ICD-10-CM

## 2015-06-26 LAB — MICROSCOPIC EXAMINATION: RBC, UA: >30 /hpf — AB (ref 0–?)

## 2015-06-26 LAB — URINALYSIS, COMPLETE
BILIRUBIN UA: NEGATIVE
Glucose, UA: NEGATIVE
KETONES UA: NEGATIVE
Nitrite, UA: NEGATIVE
SPEC GRAV UA: 1.025 (ref 1.005–1.030)
Urobilinogen, Ur: 0.2 mg/dL (ref 0.2–1.0)
pH, UA: 5.5 (ref 5.0–7.5)

## 2015-06-26 LAB — URINE CULTURE

## 2015-06-26 MED ORDER — LIDOCAINE HCL 2 % EX GEL
1.0000 "application " | Freq: Once | CUTANEOUS | Status: AC
  Administered 2015-06-26: 1 via URETHRAL

## 2015-06-26 MED ORDER — CIPROFLOXACIN HCL 500 MG PO TABS
500.0000 mg | ORAL_TABLET | Freq: Once | ORAL | Status: AC
  Administered 2015-06-26: 500 mg via ORAL

## 2015-06-26 NOTE — Progress Notes (Signed)
06/26/2015 12:00 PM   Nicholas Thompson December 05, 1955 [name]  Referring provider: Owens Loffler, MD [facility] [address] [place], [place] [address]  Chief Complaint  Patient presents with  . Cysto Stent Removal    HPI: The patient is a 60 year old gentleman presents to discuss her right renal masses seen on CT scan for lower back pain. It is in the lower right pole and is concerning for malignancy however the study was without contrast. He also was shown to have bilateral nephrolithiasis with the largest being 8 mm and all nonobstructing. The patient has never had any urological problems before. He has no history of kidney stones prior to the CT scan. He denies hematuria, nocturia, frequency, urgency, or other urinary issues. This was a completely asymptomatic incidental finding.   The patient underwent a CT renal protocol which did show this mass to be 5.2 cm exophytic mass with possible anterior perirenal fat invasion. On my review, it is unclear where this originates whether from the lower pole of the collecting cell on the TCC versus an RCC. He also does have a 1 cm stone in the left lower lower pole of his kidney.   The patient underwent ureteroscopy and the right which confirmed the mass to be RCC and not TCC. A part of a left lower pole stone removed at that time. There were still some fragments left in the left lower pole are unable to remove or access. He is a left ureteral stent placed at that time. His urinalysis today suspicious for infection. She also underwent a CT thorax which showed no irregularities or nodules.  Interval History: Patient presents today for cystoscopy stent removal.   PMH: Past Medical History  Diagnosis Date  . Other and unspecified hyperlipidemia   . Unspecified essential hypertension   . History of colonic polyps   . Basal cell carcinoma     bASAL CELL  . GERD (gastroesophageal reflux disease)   . Chronic kidney disease   .  Arthritis   . Nephrolithiasis     Surgical History: Past Surgical History  Procedure Laterality Date  . Arthroscopy of knee Right 1989  . Vasectomy    . Esophageal dilation  1998 & 2001&2006    3 times  . Shoulder arthroscopy w/ subacromial decompression and distal clavicle excision Left 08-27-2007  . Mohs surgery Left 04/2015    [facility]  . Ureteroscopy with holmium laser lithotripsy Bilateral 06/11/2015    Procedure: URETEROSCOPY WITH HOLMIUM LASER LITHOTRIPSY;  Surgeon: [name], MD;  Location: [facility] OR[facility];  Service: Urology;  Laterality: Bilateral;  . Cystoscopy with stent placement Left 06/11/2015    Procedure: CYSTOSCOPY WITH STENT PLACEMENT/ bilateral retrograde pyelogram;  Surgeon: [name], MD;  Location: [facility] OR[facility];  Service: Urology;  Laterality: Left;    Home Medications:    Medication List       This list is accurate as of: 06/26/15 12:00 PM.  Always use your most recent med list.               enalapril 20 MG tablet  Commonly known as:  VASOTEC  Take 2 tablets (40 mg total) by mouth daily.     LORazepam 1 MG tablet  Commonly known as:  ATIVAN  Take 1 tablet (1 mg total) by mouth every 8 (eight) hours as needed for anxiety.     omeprazole 20 MG capsule  Commonly known as:  PRILOSEC  TAKE ONE CAPSULE  BY MOUTH ONCE DAILY        Allergies: No Known Allergies  Family History: Family History  Problem Relation Age of Onset  . Cancer Mother     bladder  . Diabetes Father   . Hyperlipidemia Father   . Coronary artery disease Father   . Cancer Paternal Uncle     brain  . Stroke Maternal Grandfather   . Cancer Paternal Grandfather     lung  . Prostate cancer Neg Hx     Social History:  reports that he has never smoked. He has never used smokeless tobacco. He reports that he drinks alcohol. He reports that he does not use illicit drugs.  ROS:                                        Physical Exam: BP 145/81 mmHg   Pulse 74  Ht 6\' 2"  (1.88 m)  Wt 245 lb 11.2 oz (111.449 kg)  BMI 31.53 kg/m2  Constitutional:  Alert and oriented, No acute distress. HEENT: [place] AT, moist mucus membranes.  Trachea midline, no masses. Cardiovascular: No clubbing, cyanosis, or edema. Respiratory: Normal respiratory effort, no increased work of breathing. GI: Abdomen is soft, nontender, nondistended, no abdominal masses GU: No CVA tenderness.  Skin: No rashes, bruises or suspicious lesions. Lymph: No cervical or inguinal adenopathy. Neurologic: Grossly intact, no focal deficits, moving all 4 extremities. Psychiatric: Normal mood and affect.  Laboratory Data: Lab Results  Component Value Date   WBC 9.4 06/24/2015   HGB 14.7 06/24/2015   HCT 42.4 06/24/2015   MCV 89.2 06/24/2015   PLT 326 06/24/2015    Lab Results  Component Value Date   CREATININE 0.66 06/24/2015    Lab Results  Component Value Date   PSA 1.21 01/28/2015   PSA 1.44 06/25/2013   PSA 1.40 06/26/2012    No results found for: TESTOSTERONE  Lab Results  Component Value Date   HGBA1C 5.2 01/28/2015    Urinalysis    Component Value Date/Time   APPEARANCEUR Cloudy* 06/19/2015 1512   GLUCOSEU Negative 06/19/2015 1512   BILIRUBINUR Negative 06/19/2015 1512   PROTEINUR 3+* 06/19/2015 1512   NITRITE Negative 06/19/2015 1512   LEUKOCYTESUR 1+* 06/19/2015 1512    Cystoscopy Procedure Note  Patient identification was confirmed, informed consent was obtained, and patient was prepped using Betadine solution.  Lidocaine jelly was administered per urethral meatus.    Preoperative abx where received prior to procedure.     Pre-Procedure: - Inspection reveals a normal caliber ureteral meatus.  Procedure: The flexible cystoscope was introduced without difficulty - No urethral strictures/lesions are present.   Left ureteral stent removed intact per urethra.   Post-Procedure: - Patient tolerated the procedure well     Assessment &  Plan:    1. Left nephrolithiasis status post ureteroscopy and stent placement The patient's left ureteral stent was removed in the office today. -check renal ultrasound next week to ensure kidney is draining  2. Right renal mass The patient is scheduled for right hand-assisted partial nephrectomy in a few weeks. Please see office visit note dated 06/19/2015 for preoperative surgical planning discussion.   Return for surgery.  [name], MD  [facility] [address], [place], [place] [address] [phone]

## 2015-06-26 NOTE — Addendum Note (Signed)
Addended by: Toniann Fail C on: 06/26/2015 01:57 PM   Modules accepted: Orders

## 2015-06-27 NOTE — Pre-Procedure Instructions (Signed)
Urine culture results faxed to Dr. Pilar Jarvis.

## 2015-06-30 ENCOUNTER — Ambulatory Visit: Payer: BLUE CROSS/BLUE SHIELD

## 2015-07-01 ENCOUNTER — Encounter
Admission: RE | Admit: 2015-07-01 | Discharge: 2015-07-01 | Disposition: A | Discharge status: Home / Self Care | Payer: BLUE CROSS/BLUE SHIELD | Source: Ambulatory Visit | Attending: Urology | Admitting: Urology

## 2015-07-01 ENCOUNTER — Other Ambulatory Visit: Payer: Self-pay

## 2015-07-01 DIAGNOSIS — N2889 Other specified disorders of kidney and ureter: Secondary | ICD-10-CM

## 2015-07-01 DIAGNOSIS — N1330 Unspecified hydronephrosis: Secondary | ICD-10-CM | POA: Diagnosis not present

## 2015-07-01 DIAGNOSIS — N200 Calculus of kidney: Secondary | ICD-10-CM | POA: Diagnosis not present

## 2015-07-01 LAB — TYPE AND SCREEN
ABO/RH(D): O POS
Antibody Screen: NEGATIVE

## 2015-07-01 LAB — ABO/RH: ABO/RH(D): O POS

## 2015-07-02 ENCOUNTER — Other Ambulatory Visit: Payer: BLUE CROSS/BLUE SHIELD

## 2015-07-02 ENCOUNTER — Ambulatory Visit
Admission: RE | Admit: 2015-07-02 | Discharge: 2015-07-02 | Disposition: A | Discharge status: Home / Self Care | Payer: BLUE CROSS/BLUE SHIELD | Source: Ambulatory Visit | Attending: Urology | Admitting: Urology

## 2015-07-02 DIAGNOSIS — N1330 Unspecified hydronephrosis: Secondary | ICD-10-CM | POA: Insufficient documentation

## 2015-07-02 DIAGNOSIS — N2889 Other specified disorders of kidney and ureter: Secondary | ICD-10-CM

## 2015-07-02 DIAGNOSIS — N200 Calculus of kidney: Secondary | ICD-10-CM | POA: Insufficient documentation

## 2015-07-02 DIAGNOSIS — N132 Hydronephrosis with renal and ureteral calculous obstruction: Secondary | ICD-10-CM | POA: Diagnosis not present

## 2015-07-02 LAB — URINALYSIS, COMPLETE
BILIRUBIN UA: NEGATIVE
GLUCOSE, UA: NEGATIVE
KETONES UA: NEGATIVE
Nitrite, UA: NEGATIVE
PROTEIN UA: NEGATIVE
SPEC GRAV UA: 1.025 (ref 1.005–1.030)
UUROB: 0.2 mg/dL (ref 0.2–1.0)
pH, UA: 5.5 (ref 5.0–7.5)

## 2015-07-02 LAB — MICROSCOPIC EXAMINATION: WBC, UA: >30 /hpf — AB (ref 0–?)

## 2015-07-04 LAB — CULTURE, URINE COMPREHENSIVE

## 2015-07-09 ENCOUNTER — Inpatient Hospital Stay: Payer: BLUE CROSS/BLUE SHIELD | Admitting: Certified Registered"

## 2015-07-09 ENCOUNTER — Encounter: Payer: Self-pay | Admitting: *Deleted

## 2015-07-09 ENCOUNTER — Inpatient Hospital Stay
Admission: RE | Admit: 2015-07-09 | Discharge: 2015-07-15 | DRG: 657 | Disposition: A | Discharge status: Home / Self Care | Payer: BLUE CROSS/BLUE SHIELD | Source: Ambulatory Visit | Attending: Urology | Admitting: Urology

## 2015-07-09 ENCOUNTER — Encounter
Admission: RE | Disposition: A | Discharge status: Home / Self Care | Payer: Self-pay | Source: Ambulatory Visit | Attending: Urology

## 2015-07-09 DIAGNOSIS — N189 Chronic kidney disease, unspecified: Secondary | ICD-10-CM | POA: Diagnosis not present

## 2015-07-09 DIAGNOSIS — Z8052 Family history of malignant neoplasm of bladder: Secondary | ICD-10-CM | POA: Diagnosis not present

## 2015-07-09 DIAGNOSIS — Z85828 Personal history of other malignant neoplasm of skin: Secondary | ICD-10-CM

## 2015-07-09 DIAGNOSIS — R112 Nausea with vomiting, unspecified: Secondary | ICD-10-CM | POA: Diagnosis not present

## 2015-07-09 DIAGNOSIS — N200 Calculus of kidney: Secondary | ICD-10-CM | POA: Diagnosis present

## 2015-07-09 DIAGNOSIS — K219 Gastro-esophageal reflux disease without esophagitis: Secondary | ICD-10-CM | POA: Diagnosis not present

## 2015-07-09 DIAGNOSIS — E669 Obesity, unspecified: Secondary | ICD-10-CM | POA: Diagnosis not present

## 2015-07-09 DIAGNOSIS — C641 Malignant neoplasm of right kidney, except renal pelvis: Secondary | ICD-10-CM | POA: Diagnosis not present

## 2015-07-09 DIAGNOSIS — Z833 Family history of diabetes mellitus: Secondary | ICD-10-CM | POA: Diagnosis not present

## 2015-07-09 DIAGNOSIS — Z4682 Encounter for fitting and adjustment of non-vascular catheter: Secondary | ICD-10-CM | POA: Diagnosis not present

## 2015-07-09 DIAGNOSIS — K9189 Other postprocedural complications and disorders of digestive system: Secondary | ICD-10-CM | POA: Diagnosis not present

## 2015-07-09 DIAGNOSIS — Z9889 Other specified postprocedural states: Secondary | ICD-10-CM

## 2015-07-09 DIAGNOSIS — N2889 Other specified disorders of kidney and ureter: Secondary | ICD-10-CM | POA: Diagnosis not present

## 2015-07-09 DIAGNOSIS — K567 Ileus, unspecified: Secondary | ICD-10-CM | POA: Diagnosis not present

## 2015-07-09 DIAGNOSIS — Z79899 Other long term (current) drug therapy: Secondary | ICD-10-CM | POA: Diagnosis not present

## 2015-07-09 DIAGNOSIS — Z6831 Body mass index (BMI) 31.0-31.9, adult: Secondary | ICD-10-CM | POA: Diagnosis not present

## 2015-07-09 DIAGNOSIS — Z4659 Encounter for fitting and adjustment of other gastrointestinal appliance and device: Secondary | ICD-10-CM

## 2015-07-09 DIAGNOSIS — Z86010 Personal history of colonic polyps: Secondary | ICD-10-CM

## 2015-07-09 DIAGNOSIS — Z8249 Family history of ischemic heart disease and other diseases of the circulatory system: Secondary | ICD-10-CM | POA: Diagnosis not present

## 2015-07-09 DIAGNOSIS — E785 Hyperlipidemia, unspecified: Secondary | ICD-10-CM | POA: Diagnosis not present

## 2015-07-09 DIAGNOSIS — I129 Hypertensive chronic kidney disease with stage 1 through stage 4 chronic kidney disease, or unspecified chronic kidney disease: Secondary | ICD-10-CM | POA: Diagnosis present

## 2015-07-09 DIAGNOSIS — Z823 Family history of stroke: Secondary | ICD-10-CM | POA: Diagnosis not present

## 2015-07-09 DIAGNOSIS — D649 Anemia, unspecified: Secondary | ICD-10-CM | POA: Diagnosis not present

## 2015-07-09 DIAGNOSIS — N289 Disorder of kidney and ureter, unspecified: Secondary | ICD-10-CM | POA: Diagnosis not present

## 2015-07-09 HISTORY — PX: LAPAROSCOPIC NEPHRECTOMY, HAND ASSISTED: SHX1929

## 2015-07-09 SURGERY — NEPHRECTOMY, HAND-ASSISTED, LAPAROSCOPIC
Anesthesia: General | Laterality: Right | Wound class: Clean Contaminated

## 2015-07-09 MED ORDER — BUPIVACAINE LIPOSOME 1.3 % IJ SUSP
INTRAMUSCULAR | Status: AC
  Filled 2015-07-09: qty 20

## 2015-07-09 MED ORDER — HYDROCODONE-ACETAMINOPHEN 5-325 MG PO TABS
1.0000 | ORAL_TABLET | ORAL | Status: DC | PRN
Start: 2015-07-09 — End: 2015-07-15
  Administered 2015-07-09 – 2015-07-10 (×2): 1 via ORAL
  Administered 2015-07-10: 2 via ORAL
  Administered 2015-07-10 – 2015-07-15 (×2): 1 via ORAL
  Filled 2015-07-09 (×2): qty 1
  Filled 2015-07-09: qty 2
  Filled 2015-07-09 (×2): qty 1

## 2015-07-09 MED ORDER — FENTANYL CITRATE (PF) 100 MCG/2ML IJ SOLN
25.0000 ug | INTRAMUSCULAR | Status: AC | PRN
  Administered 2015-07-09 (×6): 25 ug via INTRAVENOUS

## 2015-07-09 MED ORDER — LIDOCAINE HCL (CARDIAC) 20 MG/ML IV SOLN
INTRAVENOUS | Status: DC | PRN
  Administered 2015-07-09: 100 mg via INTRAVENOUS

## 2015-07-09 MED ORDER — LACTATED RINGERS IV SOLN
INTRAVENOUS | Status: DC
  Administered 2015-07-09: 07:00:00 via INTRAVENOUS

## 2015-07-09 MED ORDER — NEOSTIGMINE METHYLSULFATE 10 MG/10ML IV SOLN
INTRAVENOUS | Status: DC | PRN
  Administered 2015-07-09: 4 mg via INTRAVENOUS

## 2015-07-09 MED ORDER — FENTANYL CITRATE (PF) 100 MCG/2ML IJ SOLN
INTRAMUSCULAR | Status: AC
  Administered 2015-07-09: 25 ug via INTRAVENOUS
  Filled 2015-07-09: qty 2

## 2015-07-09 MED ORDER — PANTOPRAZOLE SODIUM 40 MG PO TBEC
40.0000 mg | DELAYED_RELEASE_TABLET | Freq: Every day | ORAL | Status: DC
  Administered 2015-07-10: 40 mg via ORAL
  Filled 2015-07-09: qty 1

## 2015-07-09 MED ORDER — SODIUM CHLORIDE 0.9 % IV SOLN
INTRAVENOUS | Status: DC
  Administered 2015-07-09 – 2015-07-10 (×3): via INTRAVENOUS

## 2015-07-09 MED ORDER — PROPOFOL 10 MG/ML IV BOLUS
INTRAVENOUS | Status: DC | PRN
  Administered 2015-07-09: 160 mg via INTRAVENOUS

## 2015-07-09 MED ORDER — FENTANYL CITRATE (PF) 100 MCG/2ML IJ SOLN
INTRAMUSCULAR | Status: AC
  Filled 2015-07-09: qty 2

## 2015-07-09 MED ORDER — EPHEDRINE SULFATE 50 MG/ML IJ SOLN
INTRAMUSCULAR | Status: DC | PRN
Start: 2015-07-09 — End: 2015-07-09
  Administered 2015-07-09 (×3): 5 mg via INTRAVENOUS

## 2015-07-09 MED ORDER — HEPARIN SODIUM (PORCINE) 5000 UNIT/ML IJ SOLN
5000.0000 [IU] | Freq: Three times a day (TID) | INTRAMUSCULAR | Status: DC
Start: 2015-07-09 — End: 2015-07-15
  Administered 2015-07-09 – 2015-07-15 (×18): 5000 [IU] via SUBCUTANEOUS
  Filled 2015-07-09 (×18): qty 1

## 2015-07-09 MED ORDER — LORAZEPAM 1 MG PO TABS
1.0000 mg | ORAL_TABLET | Freq: Three times a day (TID) | ORAL | Status: DC | PRN
  Administered 2015-07-11 – 2015-07-12 (×2): 1 mg via ORAL
  Filled 2015-07-09 (×2): qty 1

## 2015-07-09 MED ORDER — CEFAZOLIN SODIUM-DEXTROSE 2-4 GM/100ML-% IV SOLN
2.0000 g | Freq: Once | INTRAVENOUS | Status: AC
  Administered 2015-07-09: 2 g via INTRAVENOUS

## 2015-07-09 MED ORDER — MIDAZOLAM HCL 2 MG/2ML IJ SOLN
INTRAMUSCULAR | Status: DC | PRN
  Administered 2015-07-09: 2 mg via INTRAVENOUS

## 2015-07-09 MED ORDER — LACTATED RINGERS IV SOLN
INTRAVENOUS | Status: DC | PRN
  Administered 2015-07-09: 08:00:00 via INTRAVENOUS

## 2015-07-09 MED ORDER — CEFAZOLIN SODIUM-DEXTROSE 2-4 GM/100ML-% IV SOLN
INTRAVENOUS | Status: AC
  Administered 2015-07-09: 2 g via INTRAVENOUS
  Filled 2015-07-09: qty 100

## 2015-07-09 MED ORDER — ROCURONIUM BROMIDE 100 MG/10ML IV SOLN
INTRAVENOUS | Status: DC | PRN
  Administered 2015-07-09: 50 mg via INTRAVENOUS
  Administered 2015-07-09 (×2): 20 mg via INTRAVENOUS
  Administered 2015-07-09: 10 mg via INTRAVENOUS

## 2015-07-09 MED ORDER — BUPIVACAINE HCL (PF) 0.5 % IJ SOLN
INTRAMUSCULAR | Status: AC
  Filled 2015-07-09: qty 30

## 2015-07-09 MED ORDER — ENALAPRIL MALEATE 10 MG PO TABS
40.0000 mg | ORAL_TABLET | Freq: Every day | ORAL | Status: DC
  Administered 2015-07-09 – 2015-07-15 (×6): 40 mg via ORAL
  Filled 2015-07-09 (×2): qty 4
  Filled 2015-07-09: qty 3
  Filled 2015-07-09 (×4): qty 4

## 2015-07-09 MED ORDER — PHENYLEPHRINE HCL 10 MG/ML IJ SOLN
INTRAMUSCULAR | Status: DC | PRN
  Administered 2015-07-09: 100 ug via INTRAVENOUS

## 2015-07-09 MED ORDER — ONDANSETRON HCL 4 MG/2ML IJ SOLN: 4.0000 mg | Freq: Once | INTRAMUSCULAR | Status: DC | PRN

## 2015-07-09 MED ORDER — ONDANSETRON HCL 4 MG/2ML IJ SOLN
4.0000 mg | INTRAMUSCULAR | Status: DC | PRN
  Administered 2015-07-09 – 2015-07-11 (×6): 4 mg via INTRAVENOUS
  Filled 2015-07-09 (×6): qty 2

## 2015-07-09 MED ORDER — DOCUSATE SODIUM 100 MG PO CAPS
100.0000 mg | ORAL_CAPSULE | Freq: Two times a day (BID) | ORAL | Status: DC
  Administered 2015-07-09 – 2015-07-14 (×8): 100 mg via ORAL
  Filled 2015-07-09 (×11): qty 1

## 2015-07-09 MED ORDER — CEFAZOLIN SODIUM-DEXTROSE 2-4 GM/100ML-% IV SOLN
2.0000 g | Freq: Three times a day (TID) | INTRAVENOUS | Status: AC
  Administered 2015-07-09 (×2): 2 g via INTRAVENOUS
  Filled 2015-07-09 (×3): qty 100

## 2015-07-09 MED ORDER — FENTANYL CITRATE (PF) 100 MCG/2ML IJ SOLN
INTRAMUSCULAR | Status: DC | PRN
  Administered 2015-07-09 (×2): 50 ug via INTRAVENOUS
  Administered 2015-07-09: 150 ug via INTRAVENOUS
  Administered 2015-07-09 (×3): 50 ug via INTRAVENOUS

## 2015-07-09 MED ORDER — ONDANSETRON HCL 4 MG/2ML IJ SOLN
INTRAMUSCULAR | Status: DC | PRN
  Administered 2015-07-09: 4 mg via INTRAVENOUS

## 2015-07-09 MED ORDER — GLYCOPYRROLATE 0.2 MG/ML IJ SOLN
INTRAMUSCULAR | Status: DC | PRN
  Administered 2015-07-09: 0.6 mg via INTRAVENOUS
  Administered 2015-07-09: 0.2 mg via INTRAVENOUS

## 2015-07-09 MED ORDER — HYDROMORPHONE HCL 1 MG/ML IJ SOLN
0.2500 mg | INTRAMUSCULAR | Status: DC | PRN
  Administered 2015-07-09: 0.5 mg via INTRAVENOUS

## 2015-07-09 MED ORDER — THROMBIN 5000 UNITS EX SOLR
CUTANEOUS | Status: DC | PRN
  Administered 2015-07-09: 5000 [IU] via TOPICAL

## 2015-07-09 MED ORDER — HYDROMORPHONE HCL 1 MG/ML IJ SOLN
INTRAMUSCULAR | Status: AC
  Filled 2015-07-09: qty 1

## 2015-07-09 MED ORDER — THROMBIN 5000 UNITS EX SOLR
CUTANEOUS | Status: AC
  Filled 2015-07-09: qty 5000

## 2015-07-09 MED ORDER — BUPIVACAINE LIPOSOME 1.3 % IJ SUSP
INTRAMUSCULAR | Status: DC | PRN
Start: 2015-07-09 — End: 2015-07-09
  Administered 2015-07-09: 20 mL

## 2015-07-09 MED ORDER — MORPHINE SULFATE (PF) 2 MG/ML IV SOLN
2.0000 mg | INTRAVENOUS | Status: DC | PRN
  Administered 2015-07-09: 4 mg via INTRAVENOUS
  Administered 2015-07-09: 2 mg via INTRAVENOUS
  Administered 2015-07-09: 4 mg via INTRAVENOUS
  Administered 2015-07-10: 2 mg via INTRAVENOUS
  Administered 2015-07-10: 4 mg via INTRAVENOUS
  Administered 2015-07-10: 2 mg via INTRAVENOUS
  Administered 2015-07-11: 4 mg via INTRAVENOUS
  Administered 2015-07-12: 2 mg via INTRAVENOUS
  Filled 2015-07-09: qty 2
  Filled 2015-07-09 (×3): qty 1
  Filled 2015-07-09: qty 2
  Filled 2015-07-09: qty 1
  Filled 2015-07-09: qty 2

## 2015-07-09 SURGICAL SUPPLY — 73 items
ANCHOR TIS RET SYS 1550ML (BAG) ×2 IMPLANT
APPLICATOR SURGIFLO (MISCELLANEOUS) IMPLANT
APPLIER CLIP ROT 13.4 12 LRG (CLIP)
BNDG COHESIVE 4X5 TAN STRL (GAUZE/BANDAGES/DRESSINGS) IMPLANT
CANISTER SUCT 1200ML W/VALVE (MISCELLANEOUS) ×2 IMPLANT
CATH TRAY 16F METER LATEX (MISCELLANEOUS) ×2 IMPLANT
CLEANER CAUTERY TIP 5X5 PAD (MISCELLANEOUS) ×1 IMPLANT
CLIP APPLIE ROT 13.4 12 LRG (CLIP) IMPLANT
CORD BIP STRL DISP 12FT (MISCELLANEOUS) IMPLANT
DEFOGGER SCOPE WARMER CLEARIFY (MISCELLANEOUS) ×2 IMPLANT
DISSECTOR KITTNER STICK (MISCELLANEOUS) ×2 IMPLANT
DISSECTORS/KITTNER STICK (MISCELLANEOUS) ×4
DRAPE INCISE IOBAN 66X45 STRL (DRAPES) ×2 IMPLANT
DRAPE SURG 17X11 SM STRL (DRAPES) ×8 IMPLANT
DRSG TEGADERM 2-3/8X2-3/4 SM (GAUZE/BANDAGES/DRESSINGS) IMPLANT
DRSG TEGADERM 4X4.75 (GAUZE/BANDAGES/DRESSINGS) IMPLANT
DRSG TELFA 3X8 NADH (GAUZE/BANDAGES/DRESSINGS) IMPLANT
ELECT REM PT RETURN 9FT ADLT (ELECTROSURGICAL) ×2
ELECTRODE REM PT RTRN 9FT ADLT (ELECTROSURGICAL) ×1 IMPLANT
GELPORT LAPAROSCOPIC (MISCELLANEOUS) ×2 IMPLANT
GLOVE BIO SURGEON STRL SZ 6.5 (GLOVE) ×8 IMPLANT
GLOVE INDICATOR 6.5 STRL GRN (GLOVE) ×8 IMPLANT
GOWN STRL REUS W/ TWL LRG LVL3 (GOWN DISPOSABLE) ×6 IMPLANT
GOWN STRL REUS W/TWL LRG LVL3 (GOWN DISPOSABLE) ×6
GRASPER SUT TROCAR 14GX15 (MISCELLANEOUS) ×2 IMPLANT
HANDLE YANKAUER SUCT BULB TIP (MISCELLANEOUS) ×2 IMPLANT
IRRIGATION STRYKERFLOW (MISCELLANEOUS) ×1 IMPLANT
IRRIGATOR STRYKERFLOW (MISCELLANEOUS) ×2
IV NS 1000ML (IV SOLUTION) ×1
IV NS 1000ML BAXH (IV SOLUTION) ×1 IMPLANT
KIT RM TURNOVER STRD PROC AR (KITS) ×2 IMPLANT
LABEL OR SOLS (LABEL) ×2 IMPLANT
LIGASURE MARYLAND LAP STAND (ELECTROSURGICAL) IMPLANT
LIQUID BAND (GAUZE/BANDAGES/DRESSINGS) ×2 IMPLANT
LOOP RED MAXI  1X406MM (MISCELLANEOUS)
LOOP VESSEL MAXI 1X406 RED (MISCELLANEOUS) IMPLANT
NEEDLE HYPO 25X1 1.5 SAFETY (NEEDLE) ×2 IMPLANT
NEEDLE INSUFFLATION 14GA 120MM (NEEDLE) ×2 IMPLANT
PACK LAP CHOLECYSTECTOMY (MISCELLANEOUS) ×2 IMPLANT
PAD CLEANER CAUTERY TIP 5X5 (MISCELLANEOUS) ×1
PAD TRENDELENBURG OR TABLE (MISCELLANEOUS) ×2 IMPLANT
PENCIL ELECTRO HAND CTR (MISCELLANEOUS) ×2 IMPLANT
RELOAD STAPLE SKIN SM 35W (MISCELLANEOUS) ×2 IMPLANT
SCISSORS METZENBAUM CVD 33 (INSTRUMENTS) ×2 IMPLANT
SHEARS HARMONIC ACE PLUS 36CM (ENDOMECHANICALS) IMPLANT
SPOGE SURGIFLO 8M (HEMOSTASIS) ×1
SPONGE LAP 18X18 5 PK (GAUZE/BANDAGES/DRESSINGS) ×2 IMPLANT
SPONGE SURGIFLO 8M (HEMOSTASIS) ×1 IMPLANT
STAPLE RELOAD 2.5MM WHITE (STAPLE) ×8 IMPLANT
STAPLER VASCULAR ECHELON 35 (CUTTER) ×2 IMPLANT
STRIP CLOSURE SKIN 1/2X4 (GAUZE/BANDAGES/DRESSINGS) IMPLANT
SURGILUBE 2OZ TUBE FLIPTOP (MISCELLANEOUS) IMPLANT
SUT CHROMIC 0 CT 1 (SUTURE) IMPLANT
SUT MNCRL 4-0 (SUTURE)
SUT MNCRL 4-0 27XMFL (SUTURE)
SUT MNCRL AB 4-0 PS2 18 (SUTURE) ×4 IMPLANT
SUT PDS AB 0 CT1 27 (SUTURE) ×4 IMPLANT
SUT VIC AB 0 CT1 18XCR BRD 8 (SUTURE) IMPLANT
SUT VIC AB 0 CT1 36 (SUTURE) IMPLANT
SUT VIC AB 0 CT1 8-18 (SUTURE)
SUT VIC AB 1 CT1 36 (SUTURE) IMPLANT
SUT VIC AB 2-0 SH 27 (SUTURE)
SUT VIC AB 2-0 SH 27XBRD (SUTURE) IMPLANT
SUT VIC AB 2-0 UR6 27 (SUTURE) ×2 IMPLANT
SUT VIC AB 3-0 SH 27 (SUTURE)
SUT VIC AB 3-0 SH 27X BRD (SUTURE) IMPLANT
SUT VIC AB 4-0 FS2 27 (SUTURE) IMPLANT
SUTURE MNCRL 4-0 27XMF (SUTURE) IMPLANT
TROCAR ENDOPATH XCEL 12X100 BL (ENDOMECHANICALS) ×4 IMPLANT
TROCAR XCEL 12X100 BLDLESS (ENDOMECHANICALS) ×4 IMPLANT
TROCAR XCEL NON-BLD 5MMX100MML (ENDOMECHANICALS) ×4 IMPLANT
TUBING INSUFFLATOR HI FLOW (MISCELLANEOUS) ×2 IMPLANT
WATER STERILE IRR 1000ML POUR (IV SOLUTION) IMPLANT

## 2015-07-09 NOTE — Interval H&P Note (Signed)
History and Physical Interval Note:  07/09/2015 7:12 AM  Nicholas Thompson  has presented today for surgery, with the diagnosis of RIGHT RENAL MASS  The various methods of treatment have been discussed with the patient and family. After consideration of risks, benefits and other options for treatment, the patient has consented to  Procedure(s): HAND ASSISTED LAPAROSCOPIC NEPHRECTOMY (Right) as a surgical intervention .  The patient's history has been reviewed, patient examined, no change in status, stable for surgery.  I have reviewed the patient's chart and labs.  Questions were answered to the patient's satisfaction.    RRR Unlabored resp  [name]

## 2015-07-09 NOTE — Op Note (Signed)
Date of procedure: 07/09/2015  Preoperative diagnosis:  1. Right renal mass  Postoperative diagnosis:  1. Right renal mass   Procedure: 1. Hand assisted laparoscopic right radical nephrectomy  Surgeon: Baruch Gouty, MD  Assistant: Hollice Espy, MD  Anesthesia: General  Complications: None  Intraoperative findings: The patient had a successful right nephrectomy. The inferior right renal pole mass was well encapsulated with Girard's fascia and perinephric fat. The upper pole of the right kidney was entered superficially after ligating the renal hilum. There is no tumor in this location per CT scan. The kidney in its entirety was removed with no apparent complication. The area of the superior pole that had been entered was reapproximated postoperatively for pathology.  EBL: 50 cc  Specimens: Right kidney  Drains: 16 French Foley catheter  Disposition: Stable to the postanesthesia care unit  Indication for procedure: The patient is a 60 y.o. male with a right renal mass of the lower pole that is concerning for renal cell carcinoma. The patient presents today for definitive surgery via right nephrectomy for removal of this likely malignant mass.  After reviewing the management options for treatment, the patient elected to proceed with the above surgical procedure(s). We have discussed the potential benefits and risks of the procedure, side effects of the proposed treatment, the likelihood of the patient achieving the goals of the procedure, and any potential problems that might occur during the procedure or recuperation. Informed consent has been obtained.  Description of procedure: The patient was met in the preoperative area. All risks, benefits, and indications of the procedure were described in great detail. The patient consented to the procedure. Preoperative antibiotics were given. The patient was taken to the operative theater. General anesthesia was induced per the anesthesia  service. 35 French Foley catheter was placed to dependent drainage The patient was then placed in the left lateral decubitus position (right side up, laying on left side). All pressure points were padded with great care and extremities were placed in non-compromising positions to avoid nerve injury. A timeout was called. The right side was verified to be the correct side. A horizontal 7-1/2 cm incision was made lateral to the right rectus muscle at the level of the umbilicus. Subcutaneous fat was dissected. The fascia was incised and opened. The peritoneum was opened with sharp dissection. A laparoscopic hand port was then placed. We ensure that there was no bowel entrapped between the port and the abdominal wall. Abdomen was then insufflated. Two 12 millimeter trochars were placed under direct visualization lateral to the rectus muscle above the hand port. Dissection then took place via the hand port and the assistant trocar. The colon was mobilized along the white line of [place] and shifted medially. We then dissected the subcutaneous tissues until the ureter was identified and localized. We did not disturb the right gonadal vein. The ureter was then used to and followed posteriorly until the hilum was located. Along the way, bundles of tissue were isolated and ligated with the LigaSure. This continued until the hilum was completely identified and dissected. Once there was a small bundle, a white vascular stapler load was used to staple and incise the right renal vein and right renal artery en bloc. Further staple loads were then used to proceed along the superior medial portion of the kidney. We did enter the superior pole of the kidney superficially. Forcefully, the tumor was located the inferior portion of the kidney and was not visible due to surrounding perinephric fat and  rectus fascia still surrounding the inferior pole of the kidney. The kidney was then freed and remaining entirety with the LigaSure. The  ureter was then clipped and ligated. The specimen was then placed into a specimen bag. Removed through the hand port. On inspection, which was performed at the end of the case, the inferior pole of the kidney was not visible the tumor but palpable. At this point we re-turned our attention to the hilum which had excellent hemostasis. FloSeal was placed in the hilum bed. There is no other signs of bleeding at this point. Hemostasis was excellent throughout the abdomen. Under direct visualization, 0 Vicryl interrupted sutures are placed the help of the fascial closure device under direct visualization at the 212 mm ports. Insufflation was then ended. The hand port was removed. There is no. Injury to surrounding structures prior to ending insufflation. The previously placed 0 Vicryl sutures for the trauma trocar were then tied. The fascia of the abdominal hand port incision was closed with figure-of-eight 0 PDS suture. Subcutaneous tissue was then reapproximated with 3-0 Vicryl running. Subcutaneous skin was closed a running 4-0 Vicryl. The 3 incisions were closed with Dermabond. The patient was awoken from anesthesia and transferred in stable condition to the post anesthesia care unit.  Plan: The patient be admitted to the floor overnight for monitoring. After discharge home, he will follow-up in one week for pathology results. He will likely need surveillance imaging for this renal mass depending on pathology. We will also need to monitor the size of a small residual left lower pole stone.  Baruch Gouty, M.D.

## 2015-07-09 NOTE — Anesthesia Preprocedure Evaluation (Signed)
Anesthesia Evaluation  Patient identified by MRN, date of birth, ID band Patient awake    Reviewed: Allergy & Precautions, NPO status , Patient's Chart, lab work & pertinent test results  Airway Mallampati: II       Dental  (+) Teeth Intact   Pulmonary neg pulmonary ROS,    Pulmonary exam normal        Cardiovascular Exercise Tolerance: Good hypertension, Pt. on medications  Rhythm:Regular Rate:Normal     Neuro/Psych    GI/Hepatic Neg liver ROS, GERD  ,  Endo/Other    Renal/GU Renal mass     Musculoskeletal   Abdominal (+) + obese,   Peds  Hematology  (+) anemia ,   Anesthesia Other Findings   Reproductive/Obstetrics                             Anesthesia Physical Anesthesia Plan  ASA: III  Anesthesia Plan: General   Post-op Pain Management:    Induction: Intravenous  Airway Management Planned: Oral ETT  Additional Equipment:   Intra-op Plan:   Post-operative Plan: Extubation in OR  Informed Consent: I have reviewed the patients History and Physical, chart, labs and discussed the procedure including the risks, benefits and alternatives for the proposed anesthesia with the patient or authorized representative who has indicated his/her understanding and acceptance.     Plan Discussed with: CRNA  Anesthesia Plan Comments:         Anesthesia Quick Evaluation

## 2015-07-09 NOTE — Anesthesia Postprocedure Evaluation (Signed)
Anesthesia Post Note  Patient: Nicholas Thompson  Procedure(s) Performed: Procedure(s) (LRB): HAND ASSISTED LAPAROSCOPIC NEPHRECTOMY (Right)  Patient location during evaluation: PACU Anesthesia Type: General Level of consciousness: awake Pain management: pain level controlled Vital Signs Assessment: post-procedure vital signs reviewed and stable Respiratory status: spontaneous breathing Cardiovascular status: blood pressure returned to baseline Anesthetic complications: no    Last Vitals:  Filed Vitals:   07/09/15 1056 07/09/15 1105  BP: 157/76 145/72  Pulse: 68 70  Temp:    Resp: 34 26    Last Pain:  Filed Vitals:   07/09/15 1107  PainSc: 7                  VAN STAVEREN,Kagan Mutchler

## 2015-07-09 NOTE — H&P (View-Only) (Signed)
06/19/2015 4:12 PM   Nicholas Thompson Mar 29, 1955 [name]  Referring provider: Owens Loffler, MD [place] [address] [place], [place] [address]  Chief Complaint  Patient presents with  . Routine Post Op    cysto/stent removal    HPI: The patient is a 60 year old gentleman presents to discuss her right renal masses seen on CT scan for lower back pain. It is in the lower right pole and is concerning for malignancy however the study was without contrast. He also was shown to have bilateral nephrolithiasis with the largest being 8 mm and all nonobstructing. The patient has never had any urological problems before. He has no history of kidney stones prior to the CT scan. He denies hematuria, nocturia, frequency, urgency, or other urinary issues. This was a completely asymptomatic incidental finding.   The patient underwent a CT renal protocol which did show this mass to be 5.2 cm exophytic mass with possible anterior perirenal fat invasion. On my review, it is unclear where this originates whether from the lower pole of the collecting cell on the TCC versus an RCC. He also does have a 1 cm stone in the left lower lower pole of his kidney.   The patient underwent ureteroscopy and the right which confirmed the mass to be RCC and not TCC. A part of a left lower pole stone removed at that time. There were still some fragments left in the left lower pole are unable to remove or access. He is a left ureteral stent placed at that time. His urinalysis today suspicious for infection. She also underwent a CT thorax which showed no irregularities or nodules.   PMH: Past Medical History  Diagnosis Date  . Other and unspecified hyperlipidemia   . Unspecified essential hypertension   . History of colonic polyps   . Basal cell carcinoma     bASAL CELL  . GERD (gastroesophageal reflux disease)   . Chronic kidney disease   . Arthritis   . Nephrolithiasis     Surgical  History: Past Surgical History  Procedure Laterality Date  . Arthroscopy of knee Right 1989  . Vasectomy    . Esophageal dilation  1998 & 2001&2006    3 times  . Shoulder arthroscopy w/ subacromial decompression and distal clavicle excision Left 08-27-2007  . Mohs surgery Left 04/2015    [facility], [facility]  . Ureteroscopy with holmium laser lithotripsy Bilateral 06/11/2015    Procedure: URETEROSCOPY WITH HOLMIUM LASER LITHOTRIPSY;  Surgeon: [name], MD;  Location: [facility] ORS;  Service: Urology;  Laterality: Bilateral;  . Cystoscopy with stent placement Left 06/11/2015    Procedure: CYSTOSCOPY WITH STENT PLACEMENT/ bilateral retrograde pyelogram;  Surgeon: [name], MD;  Location: [facility] ORS;  Service: Urology;  Laterality: Left;    Home Medications:    Medication List       This list is accurate as of: 06/19/15  4:12 PM.  Always use your most recent med list.               cephALEXin 500 MG capsule  Commonly known as:  KEFLEX  Take 1 capsule (500 mg total) by mouth 3 (three) times daily.     enalapril 20 MG tablet  Commonly known as:  VASOTEC  Take 2 tablets (40 mg total) by mouth daily.     fluconazole 100 MG tablet  Commonly known as:  DIFLUCAN  Take 1 tablet (100 mg total) by mouth daily.  HYDROmorphone 2 MG tablet  Commonly known as:  DILAUDID  Take 1 tablet (2 mg total) by mouth every 12 (twelve) hours as needed for severe pain.     ketoconazole 2 % cream  Commonly known as:  NIZORAL  Apply 1 application topically daily.     LORazepam 1 MG tablet  Commonly known as:  ATIVAN  Take 1 tablet (1 mg total) by mouth every 8 (eight) hours as needed for anxiety.     omeprazole 20 MG capsule  Commonly known as:  PRILOSEC  TAKE ONE CAPSULE BY MOUTH ONCE DAILY        Allergies: No Known Allergies  Family History: Family History  Problem Relation Age of Onset  . Cancer Mother     bladder  . Diabetes Father   . Hyperlipidemia Father   . Coronary artery  disease Father   . Cancer Paternal Uncle     brain  . Stroke Maternal Grandfather   . Cancer Paternal Grandfather     lung  . Prostate cancer Neg Hx     Social History:  reports that he has never smoked. He has never used smokeless tobacco. He reports that he drinks alcohol. He reports that he does not use illicit drugs.  ROS:                                        Physical Exam: BP 146/81 mmHg  Pulse 66  Temp(Src) 99 F (37.2 C) (Oral)  Ht 6\' 2"  (1.88 m)  Wt 243 lb 3.2 oz (110.315 kg)  BMI 31.21 kg/m2  Constitutional:  Alert and oriented, No acute distress. HEENT: [place] AT, moist mucus membranes.  Trachea midline, no masses. Cardiovascular: No clubbing, cyanosis, or edema. Respiratory: Normal respiratory effort, no increased work of breathing. GI: Abdomen is soft, nontender, nondistended, no abdominal masses GU: No CVA tenderness.  Skin: No rashes, bruises or suspicious lesions. Lymph: No cervical or inguinal adenopathy. Neurologic: Grossly intact, no focal deficits, moving all 4 extremities. Psychiatric: Normal mood and affect.  Laboratory Data: Lab Results  Component Value Date   WBC 9.3 06/09/2015   HGB 16.4 06/09/2015   HCT 47.8 06/09/2015   MCV 88.4 06/09/2015   PLT 249 06/09/2015    Lab Results  Component Value Date   CREATININE 0.81 06/09/2015    Lab Results  Component Value Date   PSA 1.21 01/28/2015   PSA 1.44 06/25/2013   PSA 1.40 06/26/2012    No results found for: TESTOSTERONE  Lab Results  Component Value Date   HGBA1C 5.2 01/28/2015    Urinalysis    Component Value Date/Time   APPEARANCEUR Clear 05/23/2015 1454   GLUCOSEU Negative 05/23/2015 1454   BILIRUBINUR Negative 05/23/2015 1454   PROTEINUR Negative 05/23/2015 1454   NITRITE Negative 05/23/2015 1454   LEUKOCYTESUR Negative 05/23/2015 1454    Pertinent Imaging: CLINICAL DATA: Followup abnormal chest radiograph with question lung nodule at RIGHT  base  EXAM: CT CHEST WITHOUT CONTRAST  TECHNIQUE: Multidetector CT imaging of the chest was performed following the standard protocol without IV contrast.  COMPARISON: 08/06/2009 ; correlation chest radiograph 06/09/2015  FINDINGS: Scattered atherosclerotic calcifications aorta and coronary arteries.  Ascending thoracic aorta mildly dilated 4.3 cm transverse image 27 previously 4.1 cm.  No thoracic adenopathy.  LEFT ureteral stent without obvious hydronephrosis.  Visualized upper abdomen otherwise unremarkable.  Lungs well expanded and  clear.  No pulmonary infiltrate, pleural effusion or pneumothorax.  No pulmonary mass or nodule.  Mild scattered degenerative disc disease changes thoracic spine.  IMPRESSION: No evidence of pulmonary nodule.  Question nodular density on previous exam likely either represented a nipple shadow or a summation artifact.  Mild aneurysmal dilatation ascending thoracic aorta 4.3 cm diameter, recommendation below.  Recommend annual imaging followup by CTA or MRA.  This recommendation follows 2010 ACCF/AHA/AATS/ACR/ASA/SCA/SCAI/SIR/STS/SVM Guidelines for the Diagnosis and Management of Patients with Thoracic Aortic Disease. Circulation. 2010; 121: [id number]  Assessment & Plan:    1. Right renal mass I had a long discussion with the patient regarding his right renal mass that is likely RCC. There are no signs of distal metastases at this time. We discussed a right hand-assisted laparoscopic nephrectomy. He understands the risks, benefits, and indications of this procedure. He understands this is an inpatient procedure with at least a 1 day hospital admission. He understands that he will have a Foley catheter after the procedure. He understands the risks include but are not limited to bleeding, blood transfusion, infection, bowel injury, vascular injury, surgical misadventure, anesthesia complications, DVT, PE, pneumonia, and  death. All the patient's questions were answered. He elects to proceed with the surgery.  2. Left nephrolithiasis status post ureteroscopy and stent placement The patient is a urinalysis concerning for infection. We will culture his urine. He'll follow-up next week for cystoscopy stent removal. I will like to have the stent removed prior to surgery to ensure the left kidney is draining properly as it will be a solitary kidney after above.  3. Mild dilation of ascending thoracic aorta Per the radiology report, it was recommended the patient undergo annual imaging follow-up by CTA or MRA. The patient was given a copy of this report instructed to follow-up with his primary care provider for further workup in one year. He voiced understanding.   Return in about 1 week (around 06/26/2015) for cysto stent removal.  [name], [place] [address], [place], [place] [address] [phone]

## 2015-07-09 NOTE — Transfer of Care (Signed)
Immediate Anesthesia Transfer of Care Note  Patient: Nicholas Thompson  Procedure(s) Performed: Procedure(s): HAND ASSISTED LAPAROSCOPIC NEPHRECTOMY (Right)  Patient Location: PACU  Anesthesia Type:General  Level of Consciousness: awake, alert  and responds to stimulation  Airway & Oxygen Therapy: Patient Spontanous Breathing and Patient connected to face mask oxygen  Post-op Assessment: Report given to RN and Post -op Vital signs reviewed and stable  Post vital signs: Reviewed and stable  Last Vitals:  Filed Vitals:   07/09/15 0614 07/09/15 1035  BP: 178/95 154/74  Pulse: 67 77  Temp: 36.2 C   Resp: 16 10    Complications: No apparent anesthesia complications

## 2015-07-09 NOTE — Anesthesia Procedure Notes (Signed)
Procedure Name: Intubation Performed by: Lance Muss Pre-anesthesia Checklist: Emergency Drugs available, Patient identified, Suction available, Patient being monitored and Timeout performed Patient Re-evaluated:Patient Re-evaluated prior to inductionOxygen Delivery Method: Circle system utilized Preoxygenation: Pre-oxygenation with 100% oxygen Intubation Type: IV induction Ventilation: Two handed mask ventilation required and Oral airway inserted - appropriate to patient size Laryngoscope Size: Mac and 4 Grade View: Grade I Tube type: Oral Tube size: 7.5 mm Number of attempts: 1 Airway Equipment and Method: Stylet Placement Confirmation: ETT inserted through vocal cords under direct vision,  positive ETCO2 and breath sounds checked- equal and bilateral Secured at: 21 cm Tube secured with: Tape Dental Injury: Teeth and Oropharynx as per pre-operative assessment

## 2015-07-10 LAB — BASIC METABOLIC PANEL
ANION GAP: 7 (ref 5–15)
BUN: 14 mg/dL (ref 6–20)
CALCIUM: 8.4 mg/dL — AB (ref 8.9–10.3)
CO2: 25 mmol/L (ref 22–32)
Chloride: 109 mmol/L (ref 101–111)
Creatinine, Ser: 1.4 mg/dL — ABNORMAL HIGH (ref 0.61–1.24)
GFR, EST NON AFRICAN AMERICAN: 53 mL/min — AB (ref >60)
Glucose, Bld: 122 mg/dL — ABNORMAL HIGH (ref 65–99)
Potassium: 4 mmol/L (ref 3.5–5.1)
Sodium: 141 mmol/L (ref 135–145)

## 2015-07-10 LAB — CBC
HEMATOCRIT: 40.5 % (ref 40.0–52.0)
Hemoglobin: 13.9 g/dL (ref 13.0–18.0)
MCH: 30.8 pg (ref 26.0–34.0)
MCHC: 34.3 g/dL (ref 32.0–36.0)
MCV: 89.7 fL (ref 80.0–100.0)
PLATELETS: 191 10*3/uL (ref 150–440)
RBC: 4.51 MIL/uL (ref 4.40–5.90)
RDW: 12.8 % (ref 11.5–14.5)
WBC: 10.8 10*3/uL — AB (ref 3.8–10.6)

## 2015-07-10 MED ORDER — CALCIUM CARBONATE ANTACID 500 MG PO CHEW
2.0000 | CHEWABLE_TABLET | Freq: Three times a day (TID) | ORAL | Status: DC | PRN
  Administered 2015-07-10 – 2015-07-11 (×2): 400 mg via ORAL
  Filled 2015-07-10 (×2): qty 2

## 2015-07-10 MED ORDER — HYDROCODONE-ACETAMINOPHEN 5-325 MG PO TABS: 1.0000 | ORAL_TABLET | ORAL | Status: DC | PRN

## 2015-07-10 MED ORDER — CALCIUM CARBONATE ANTACID 500 MG PO CHEW: 2.0000 | CHEWABLE_TABLET | Freq: Three times a day (TID) | ORAL | Status: DC

## 2015-07-10 MED ORDER — DOCUSATE SODIUM 100 MG PO CAPS: 100.0000 mg | ORAL_CAPSULE | Freq: Two times a day (BID) | ORAL | Status: DC

## 2015-07-10 NOTE — Progress Notes (Addendum)
Patient with n/v overnight. Able to keep a small amt of liquid down this AM No flatus/bm/f/c Pain controlled No ambulation  Filed Vitals:   07/09/15 1237 07/09/15 2116 07/09/15 2353 07/10/15 0502  BP: 151/69 146/65 152/63 151/80  Pulse: 73 71 73 70  Temp: 98 F (36.7 C) 98.3 F (36.8 C) 98.4 F (36.9 C) 98.8 F (37.1 C)  TempSrc: Oral Oral Oral Oral  Resp:  16 16 16   Height:      Weight:      SpO2: 98% 99% 98% 98%   I/O last 3 completed shifts: In: 3348.3 [P.O.:240; I.V.:3108.3] Out: 1975 [id number]; Blood:50]     NAD  Soft mod d, approp t. Inc c/d/i with minor bruising Foley clear yellow  CBC    Component Value Date/Time   WBC 10.8* 07/10/2015 0422   RBC 4.51 07/10/2015 0422   HGB 13.9 07/10/2015 0422   HCT 40.5 07/10/2015 0422   PLT 191 07/10/2015 0422   MCV 89.7 07/10/2015 0422   MCH 30.8 07/10/2015 0422   MCHC 34.3 07/10/2015 0422   RDW 12.8 07/10/2015 0422   LYMPHSABS 1.6 05/11/2015 1155   MONOABS 0.7 05/11/2015 1155   EOSABS 0.1 05/11/2015 1155   BASOSABS 0.0 05/11/2015 1155    BMP Latest Ref Rng 07/10/2015 06/24/2015 06/09/2015  Glucose 65 - 99 mg/dL 122(H) 101(H) 118(H)  BUN 6 - 20 mg/dL 14 10 10   Creatinine 0.61 - 1.24 mg/dL 1.40(H) 0.66 0.81  Sodium 135 - 145 mmol/L 141 138 140  Potassium 3.5 - 5.1 mmol/L 4.0 3.8 3.8  Chloride 101 - 111 mmol/L 109 103 106  CO2 22 - 32 mmol/L 25 29 29   Calcium 8.9 - 10.3 mg/dL 8.4(L) 9.2 9.3    POD 1 R hand assist lap nx. Progressing slowly. -continue fulls. If more emesis, will make npo. Pt instructed to not force liquids -pain control -> wean IV narcotics as tolerated -IVF -recheck AM labs -d/c foley -likely d/c home tomorrow if continues to progress

## 2015-07-11 ENCOUNTER — Inpatient Hospital Stay: Payer: BLUE CROSS/BLUE SHIELD

## 2015-07-11 LAB — BASIC METABOLIC PANEL
Anion gap: 10 (ref 5–15)
BUN: 17 mg/dL (ref 6–20)
CHLORIDE: 104 mmol/L (ref 101–111)
CO2: 27 mmol/L (ref 22–32)
Calcium: 9.4 mg/dL (ref 8.9–10.3)
Creatinine, Ser: 1.37 mg/dL — ABNORMAL HIGH (ref 0.61–1.24)
GFR calc non Af Amer: 55 mL/min — ABNORMAL LOW (ref >60)
Glucose, Bld: 130 mg/dL — ABNORMAL HIGH (ref 65–99)
POTASSIUM: 3.7 mmol/L (ref 3.5–5.1)
SODIUM: 141 mmol/L (ref 135–145)

## 2015-07-11 LAB — CBC
HCT: 45.7 % (ref 40.0–52.0)
HEMOGLOBIN: 15.7 g/dL (ref 13.0–18.0)
MCH: 31 pg (ref 26.0–34.0)
MCHC: 34.4 g/dL (ref 32.0–36.0)
MCV: 90.1 fL (ref 80.0–100.0)
Platelets: 212 10*3/uL (ref 150–440)
RBC: 5.07 MIL/uL (ref 4.40–5.90)
RDW: 12.9 % (ref 11.5–14.5)
WBC: 15.2 10*3/uL — AB (ref 3.8–10.6)

## 2015-07-11 LAB — PHOSPHORUS: PHOSPHORUS: 2.8 mg/dL (ref 2.5–4.6)

## 2015-07-11 LAB — MAGNESIUM: MAGNESIUM: 1.7 mg/dL (ref 1.7–2.4)

## 2015-07-11 MED ORDER — PANTOPRAZOLE SODIUM 40 MG IV SOLR
40.0000 mg | INTRAVENOUS | Status: DC
  Administered 2015-07-11 – 2015-07-15 (×5): 40 mg via INTRAVENOUS
  Filled 2015-07-11 (×5): qty 40

## 2015-07-11 MED ORDER — ONDANSETRON HCL 4 MG/2ML IJ SOLN
4.0000 mg | INTRAMUSCULAR | Status: DC | PRN
  Administered 2015-07-11: 8 mg via INTRAVENOUS
  Filled 2015-07-11: qty 4

## 2015-07-11 MED ORDER — MAGNESIUM SULFATE 50 % IJ SOLN: 2.0000 g | Freq: Once | INTRAMUSCULAR | Status: DC

## 2015-07-11 MED ORDER — MAGNESIUM SULFATE 2 GM/50ML IV SOLN
2.0000 g | Freq: Once | INTRAVENOUS | Status: AC
  Administered 2015-07-11: 2 g via INTRAVENOUS
  Filled 2015-07-11: qty 50

## 2015-07-11 MED ORDER — ACETAMINOPHEN 10 MG/ML IV SOLN
1000.0000 mg | Freq: Four times a day (QID) | INTRAVENOUS | Status: AC
  Administered 2015-07-11 – 2015-07-12 (×4): 1000 mg via INTRAVENOUS
  Filled 2015-07-11 (×5): qty 100

## 2015-07-11 MED ORDER — KCL IN DEXTROSE-NACL 20-5-0.45 MEQ/L-%-% IV SOLN
INTRAVENOUS | Status: DC
  Administered 2015-07-11 – 2015-07-14 (×8): via INTRAVENOUS
  Filled 2015-07-11 (×10): qty 1000

## 2015-07-11 NOTE — Progress Notes (Signed)
Patient emesis x3 overnight KUB with ileus Feels better after vomiting Pain controlled +ambulation/IS  Filed Vitals:   07/10/15 1238 07/10/15 2034 07/10/15 2115 07/11/15 0624  BP: 156/78 160/70  170/87  Pulse: 71 88  91  Temp: 98.6 F (37 C) 100.3 F (37.9 C) 99.7 F (37.6 C) 98.2 F (36.8 C)  TempSrc: Oral Oral Oral Oral  Resp: 16 24  18   Height:      Weight:      SpO2: 98% 94%  95%   I/O last 3 completed shifts: In: 4059.3 [P.O.:840; I.V.:3219.3] Out: 3100 [Urine:2550; Emesis/NG output:550] Total I/O In: [id number] [I.V.:537] Out: 0   NAD Soft mod d approp t. No rebound. Inc c/d/i No foley  CBC    Component Value Date/Time   WBC 15.2* 07/11/2015 0415   RBC 5.07 07/11/2015 0415   HGB 15.7 07/11/2015 0415   HCT 45.7 07/11/2015 0415   PLT 212 07/11/2015 0415   MCV 90.1 07/11/2015 0415   MCH 31.0 07/11/2015 0415   MCHC 34.4 07/11/2015 0415   RDW 12.9 07/11/2015 0415   LYMPHSABS 1.6 05/11/2015 1155   MONOABS 0.7 05/11/2015 1155   EOSABS 0.1 05/11/2015 1155   BASOSABS 0.0 05/11/2015 1155    Lytes pending  POD 2 right hand assist nx. Now with ileus -NPO/IVF -dvt/gi prophylaxis -chewing gum -f/u pending lytes -if emesis again, will place NG tube -daily labs -await return of bowel function

## 2015-07-11 NOTE — Progress Notes (Signed)
Patient awake most of night, nauseated and vomitted twice for a total of 550 ml of brown colored emesis. zofran given twice. Patient repositioned for comfort on to his left side but did not tolerate that position very well. Nicholas Thompson

## 2015-07-12 LAB — MAGNESIUM: MAGNESIUM: 1.9 mg/dL (ref 1.7–2.4)

## 2015-07-12 LAB — PHOSPHORUS: PHOSPHORUS: 1.6 mg/dL — AB (ref 2.5–4.6)

## 2015-07-12 LAB — BASIC METABOLIC PANEL
Anion gap: 7 (ref 5–15)
BUN: 25 mg/dL — ABNORMAL HIGH (ref 6–20)
CALCIUM: 8.7 mg/dL — AB (ref 8.9–10.3)
CO2: 26 mmol/L (ref 22–32)
CREATININE: 1.38 mg/dL — AB (ref 0.61–1.24)
Chloride: 107 mmol/L (ref 101–111)
GFR, EST NON AFRICAN AMERICAN: 54 mL/min — AB (ref >60)
Glucose, Bld: 131 mg/dL — ABNORMAL HIGH (ref 65–99)
Potassium: 3.7 mmol/L (ref 3.5–5.1)
SODIUM: 140 mmol/L (ref 135–145)

## 2015-07-12 LAB — CBC
HCT: 40.4 % (ref 40.0–52.0)
Hemoglobin: 13.9 g/dL (ref 13.0–18.0)
MCH: 30.8 pg (ref 26.0–34.0)
MCHC: 34.4 g/dL (ref 32.0–36.0)
MCV: 89.6 fL (ref 80.0–100.0)
PLATELETS: 194 10*3/uL (ref 150–440)
RBC: 4.51 MIL/uL (ref 4.40–5.90)
RDW: 12.8 % (ref 11.5–14.5)
WBC: 11.5 10*3/uL — AB (ref 3.8–10.6)

## 2015-07-12 MED ORDER — POTASSIUM PHOSPHATES 15 MMOLE/5ML IV SOLN
40.0000 meq | Freq: Once | INTRAVENOUS | Status: AC
  Administered 2015-07-12: 40 meq via INTRAVENOUS
  Filled 2015-07-12: qty 9.09

## 2015-07-12 MED ORDER — MAGNESIUM SULFATE 2 GM/50ML IV SOLN
2.0000 g | Freq: Once | INTRAVENOUS | Status: AC
  Administered 2015-07-12: 2 g via INTRAVENOUS
  Filled 2015-07-12: qty 50

## 2015-07-12 MED ORDER — POTASSIUM CHLORIDE 10 MEQ/100ML IV SOLN
10.0000 meq | INTRAVENOUS | Status: AC
  Administered 2015-07-12 (×4): 10 meq via INTRAVENOUS
  Filled 2015-07-12 (×4): qty 100

## 2015-07-12 NOTE — Progress Notes (Signed)
Per Dr. Pilar Jarvis, clamp NGT.

## 2015-07-12 NOTE — Progress Notes (Signed)
NG tube placed yesterday with >1L initial output Only 250 cc output last 12 hrs Pain improved Feels "rumbling" in abdomen which is new. No flatus yet. No n/v +ambulation/IS  Filed Vitals:   07/11/15 0624 07/11/15 1243 07/11/15 2009 07/12/15 0512  BP: 170/87 151/82 113/63 159/72  Pulse: 91 99 81 59  Temp: 98.2 F (36.8 C) 98.7 F (37.1 C) 99.2 F (37.3 C) 98.2 F (36.8 C)  TempSrc: Oral Oral Oral Oral  Resp: 18 20 20 20   Height:      Weight:      SpO2: 95% 89% 93% 96%   I/O last 3 completed shifts: In: 4868.9 [P.O.:120; I.V.:4638.9; NG/GT:60; IV Piggyback:50] Out: [id number] [Urine:1450; Emesis/NG output:1100]    NAD Soft mild D - dramatically improved. approp t. Inc/d/i with minimal brusing No foley  CBC    Component Value Date/Time   WBC 11.5* 07/12/2015 0703   RBC 4.51 07/12/2015 0703   HGB 13.9 07/12/2015 0703   HCT 40.4 07/12/2015 0703   PLT 194 07/12/2015 0703   MCV 89.6 07/12/2015 0703   MCH 30.8 07/12/2015 0703   MCHC 34.4 07/12/2015 0703   RDW 12.8 07/12/2015 0703   LYMPHSABS 1.6 05/11/2015 1155   MONOABS 0.7 05/11/2015 1155   EOSABS 0.1 05/11/2015 1155   BASOSABS 0.0 05/11/2015 1155   BMP Latest Ref Rng 07/12/2015 07/11/2015 07/10/2015  Glucose 65 - 99 mg/dL 131(H) 130(H) 122(H)  BUN 6 - 20 mg/dL 25(H) 17 14  Creatinine 0.61 - 1.24 mg/dL 1.38(H) 1.37(H) 1.40(H)  Sodium 135 - 145 mmol/L 140 141 141  Potassium 3.5 - 5.1 mmol/L 3.7 3.7 4.0  Chloride 101 - 111 mmol/L 107 104 109  CO2 22 - 32 mmol/L 26 27 25   Calcium 8.9 - 10.3 mg/dL 8.7(L) 9.4 8.4(L)      POD 3 right hand assist nx. Ileus improving -NPO/IVF -clamp NG tube today -if doing well in PM, will start ice chips -chewing gum -gi/dvt prophylaxis -replete lytes -daily labs -await return of bowel function

## 2015-07-13 LAB — CBC
HCT: 37.1 % — ABNORMAL LOW (ref 40.0–52.0)
HEMOGLOBIN: 13 g/dL (ref 13.0–18.0)
MCH: 31.7 pg (ref 26.0–34.0)
MCHC: 35 g/dL (ref 32.0–36.0)
MCV: 90.5 fL (ref 80.0–100.0)
PLATELETS: 191 10*3/uL (ref 150–440)
RBC: 4.09 MIL/uL — AB (ref 4.40–5.90)
RDW: 12.4 % (ref 11.5–14.5)
WBC: 17 10*3/uL — AB (ref 3.8–10.6)

## 2015-07-13 LAB — BASIC METABOLIC PANEL
ANION GAP: 9 (ref 5–15)
BUN: 20 mg/dL (ref 6–20)
CHLORIDE: 105 mmol/L (ref 101–111)
CO2: 24 mmol/L (ref 22–32)
Calcium: 8.2 mg/dL — ABNORMAL LOW (ref 8.9–10.3)
Creatinine, Ser: 1.22 mg/dL (ref 0.61–1.24)
GFR calc Af Amer: >60 mL/min (ref >60)
GLUCOSE: 140 mg/dL — AB (ref 65–99)
POTASSIUM: 3.8 mmol/L (ref 3.5–5.1)
Sodium: 138 mmol/L (ref 135–145)

## 2015-07-13 LAB — PHOSPHORUS: Phosphorus: 1.9 mg/dL — ABNORMAL LOW (ref 2.5–4.6)

## 2015-07-13 LAB — MAGNESIUM: MAGNESIUM: 1.8 mg/dL (ref 1.7–2.4)

## 2015-07-13 MED ORDER — POTASSIUM CHLORIDE 10 MEQ/100ML IV SOLN
10.0000 meq | INTRAVENOUS | Status: AC
  Administered 2015-07-13 (×2): 10 meq via INTRAVENOUS
  Filled 2015-07-13 (×2): qty 100

## 2015-07-13 MED ORDER — POTASSIUM PHOSPHATES 15 MMOLE/5ML IV SOLN: 40.0000 meq | Freq: Once | INTRAVENOUS | Status: DC

## 2015-07-13 MED ORDER — POTASSIUM & SODIUM PHOSPHATES 280-160-250 MG PO PACK
1.0000 | PACK | Freq: Three times a day (TID) | ORAL | Status: AC
  Administered 2015-07-13 (×2): 1 via ORAL
  Filled 2015-07-13 (×3): qty 1

## 2015-07-13 MED ORDER — ACETAMINOPHEN 325 MG PO TABS
650.0000 mg | ORAL_TABLET | Freq: Four times a day (QID) | ORAL | Status: DC | PRN
  Administered 2015-07-13 – 2015-07-14 (×2): 650 mg via ORAL
  Filled 2015-07-13 (×2): qty 2

## 2015-07-13 MED ORDER — CALCIUM CARBONATE ANTACID 500 MG PO CHEW: 1.0000 | CHEWABLE_TABLET | Freq: Three times a day (TID) | ORAL | Status: DC | PRN

## 2015-07-13 MED ORDER — MAGNESIUM SULFATE 2 GM/50ML IV SOLN
2.0000 g | Freq: Once | INTRAVENOUS | Status: AC
  Administered 2015-07-13: 2 g via INTRAVENOUS
  Filled 2015-07-13: qty 50

## 2015-07-13 MED ORDER — MAGNESIUM SULFATE 50 % IJ SOLN
2.0000 g | Freq: Once | INTRAMUSCULAR | Status: DC
  Filled 2015-07-13: qty 4

## 2015-07-13 NOTE — Progress Notes (Signed)
Patient has a temp of 100.6.  There is no order for tylenol and patient has been declining pain medication so he has not been taking any Norco.  Dr. Pilar Jarvis was paged and notified of this over the telephone.  An order was given for tylenol as needed.

## 2015-07-13 NOTE — Progress Notes (Addendum)
Did well with NG clamped and ice chips overnight Pain improved +flatus No n/v/bm +ambulation/IS Alight increase in WBC overnight.  Filed Vitals:   07/12/15 1430 07/12/15 2143 07/12/15 2300 07/13/15 0411  BP:  142/57  148/70  Pulse:  80  82  Temp: 99.9 F (37.7 C) 100.2 F (37.9 C)  100.1 F (37.8 C)  TempSrc: Oral Oral  Oral  Resp:  16  18  Height:      Weight:      SpO2:  91% 94% 94%   I/O last 3 completed shifts: In: 5171.7 [I.V.:4629.7; NG/GT:150; IV Piggyback:392] Out: 2060 [Urine:1840; Emesis/NG output:220]    NAD Soft mild D - dramatically improved. approp t. Inc/d/i with minimal brusing No foley  CBC    Component Value Date/Time   WBC 17.0* 07/13/2015 0505   RBC 4.09* 07/13/2015 0505   HGB 13.0 07/13/2015 0505   HCT 37.1* 07/13/2015 0505   PLT 191 07/13/2015 0505   MCV 90.5 07/13/2015 0505   MCH 31.7 07/13/2015 0505   MCHC 35.0 07/13/2015 0505   RDW 12.4 07/13/2015 0505   LYMPHSABS 1.6 05/11/2015 1155   MONOABS 0.7 05/11/2015 1155   EOSABS 0.1 05/11/2015 1155   BASOSABS 0.0 05/11/2015 1155   BMP Latest Ref Rng 07/13/2015 07/12/2015 07/11/2015  Glucose 65 - 99 mg/dL 140(H) 131(H) 130(H)  BUN 6 - 20 mg/dL 20 25(H) 17  Creatinine 0.61 - 1.24 mg/dL 1.22 1.38(H) 1.37(H)  Sodium 135 - 145 mmol/L 138 140 141  Potassium 3.5 - 5.1 mmol/L 3.8 3.7 3.7  Chloride 101 - 111 mmol/L 105 107 104  CO2 22 - 32 mmol/L 24 26 27   Calcium 8.9 - 10.3 mg/dL 8.2(L) 8.7(L) 9.4      POD 4 right hand assist lap nx. Ileus improving ->+flatus -adv to clears -d/c NG tube -WBC:17 -> recheck in AM -chewing gum -gi/dvt prophylaxis -replete lytes -daily labs -likely adv to regular diet in AM and home tomorrow

## 2015-07-14 LAB — CBC
HEMATOCRIT: 36.4 % — AB (ref 40.0–52.0)
HEMOGLOBIN: 12.3 g/dL — AB (ref 13.0–18.0)
MCH: 30.9 pg (ref 26.0–34.0)
MCHC: 33.9 g/dL (ref 32.0–36.0)
MCV: 91.1 fL (ref 80.0–100.0)
Platelets: 196 10*3/uL (ref 150–440)
RBC: 4 MIL/uL — ABNORMAL LOW (ref 4.40–5.90)
RDW: 12.7 % (ref 11.5–14.5)
WBC: 16.4 10*3/uL — AB (ref 3.8–10.6)

## 2015-07-14 LAB — BASIC METABOLIC PANEL
ANION GAP: 7 (ref 5–15)
BUN: 17 mg/dL (ref 6–20)
CHLORIDE: 109 mmol/L (ref 101–111)
CO2: 23 mmol/L (ref 22–32)
Calcium: 8.2 mg/dL — ABNORMAL LOW (ref 8.9–10.3)
Creatinine, Ser: 1.09 mg/dL (ref 0.61–1.24)
GFR calc Af Amer: >60 mL/min (ref >60)
GFR calc non Af Amer: >60 mL/min (ref >60)
GLUCOSE: 126 mg/dL — AB (ref 65–99)
Potassium: 3.8 mmol/L (ref 3.5–5.1)
Sodium: 139 mmol/L (ref 135–145)

## 2015-07-14 LAB — PHOSPHORUS: Phosphorus: 2.3 mg/dL — ABNORMAL LOW (ref 2.5–4.6)

## 2015-07-14 LAB — SURGICAL PATHOLOGY

## 2015-07-14 LAB — MAGNESIUM: MAGNESIUM: 2 mg/dL (ref 1.7–2.4)

## 2015-07-14 MED ORDER — DIPHENHYDRAMINE HCL 25 MG PO CAPS
25.0000 mg | ORAL_CAPSULE | Freq: Four times a day (QID) | ORAL | Status: DC | PRN
  Administered 2015-07-14: 25 mg via ORAL
  Filled 2015-07-14: qty 1

## 2015-07-14 NOTE — Progress Notes (Signed)
07/14/2015  Subjective: Passing flatus, tolerating regular diet, normal bowel movements. Fever to 101 at 5 AM today Voiding without difficulty  Filed Vitals:   07/14/15 0539 07/14/15 0805 07/14/15 0943 07/14/15 1254  BP: 153/60  144/65 161/74  Pulse: 56  61 64  Temp: 101.1 F (38.4 C) 98.9 F (37.2 C)  98.4 F (36.9 C)  TempSrc: Oral Oral  Oral  Resp: 20     Height:      Weight:      SpO2: 97%   96%   I/O last 3 completed shifts: In: 4801.7 [P.O.:360; I.V.:4441.7] Out: 800 [Urine:800] Total I/O In: [id number] [P.O.:750; I.V.:583] Out: 200 [Urine:200]   NAD  No respiratory distress Alert and oriented 3 Neurologically intact, no deficits, moving all 4 extremities Soft mod d, approp t. Inc c/d/i with minor bruising  No lower extremity edema 2  CBC    Component Value Date/Time   WBC 16.4* 07/14/2015 0505   RBC 4.00* 07/14/2015 0505   HGB 12.3* 07/14/2015 0505   HCT 36.4* 07/14/2015 0505   PLT 196 07/14/2015 0505   MCV 91.1 07/14/2015 0505   MCH 30.9 07/14/2015 0505   MCHC 33.9 07/14/2015 0505   RDW 12.7 07/14/2015 0505   LYMPHSABS 1.6 05/11/2015 1155   MONOABS 0.7 05/11/2015 1155   EOSABS 0.1 05/11/2015 1155   BASOSABS 0.0 05/11/2015 1155    BMP Latest Ref Rng 07/14/2015 07/13/2015 07/12/2015  Glucose 65 - 99 mg/dL 126(H) 140(H) 131(H)  BUN 6 - 20 mg/dL 17 20 25(H)  Creatinine 0.61 - 1.24 mg/dL 1.09 1.22 1.38(H)  Sodium 135 - 145 mmol/L 139 138 140  Potassium 3.5 - 5.1 mmol/L 3.8 3.8 3.7  Chloride 101 - 111 mmol/L 109 105 107  CO2 22 - 32 mmol/L 23 24 26   Calcium 8.9 - 10.3 mg/dL 8.2(L) 8.2(L) 8.7(L)    POD 5 R hand assist lap nx.  Ileus resolved.  Temp to 101. -continue regular diet -discussed with Dr. Pilar Jarvis, will work up fever if febrile to 101 again -encouraged IS/ ambulation -likely home once no longer febrile x 24 hours

## 2015-07-14 NOTE — Progress Notes (Signed)
Per Dr. Pilar Jarvis okay to place order for Benadryl 25 mg q 6 hours prn

## 2015-07-15 LAB — CBC
HCT: 36 % — ABNORMAL LOW (ref 40.0–52.0)
Hemoglobin: 12.6 g/dL — ABNORMAL LOW (ref 13.0–18.0)
MCH: 31 pg (ref 26.0–34.0)
MCHC: 34.9 g/dL (ref 32.0–36.0)
MCV: 88.9 fL (ref 80.0–100.0)
PLATELETS: 210 10*3/uL (ref 150–440)
RBC: 4.05 MIL/uL — ABNORMAL LOW (ref 4.40–5.90)
RDW: 12.6 % (ref 11.5–14.5)
WBC: 14.5 10*3/uL — ABNORMAL HIGH (ref 3.8–10.6)

## 2015-07-15 NOTE — Discharge Instructions (Signed)

## 2015-07-15 NOTE — Discharge Summary (Signed)
Date of admission: 07/09/2015  Date of discharge: 07/15/2015  Admission diagnosis: left renal mass  Discharge diagnosis: same, s/p hand assisted lap radical nephrectomy  Secondary diagnoses:  Patient Active Problem List   Diagnosis Date Noted  . Renal mass 07/09/2015  . COLONIC POLYPS, HX OF 08/07/2008  . HYPERLIPIDEMIA 05/18/2007  . HYPERTENSION 05/18/2007  . HYPERGLYCEMIA 05/18/2007  . CARCINOMA, BASAL CELL, HX OF 05/18/2007    History and Physical: For full details, please see admission history and physical. Briefly, Nicholas Thompson is a 60 y.o. year old patient with left renal mass.   Hospital Course: Patient tolerated the procedure well.  He was then transferred to the floor after an uneventful PACU stay.  His hospital course was complicated by a post-op ileus requiring NGT and NPO status for several days.   Otherwise he did well. On POD#6  he had met discharge criteria: was eating a regular diet, was up and ambulating independently,  pain was well controlled, was voiding without a catheter, and was ready to for discharge.  PE on day of discharge: Filed Vitals:   07/14/15 1254 07/14/15 2021 07/15/15 0607 07/15/15 0852  BP: 161/74 164/57 160/55 150/64  Pulse: 64 59 57 64  Temp: 98.4 F (36.9 C) 98.7 F (37.1 C) 98.2 F (36.8 C) 99.1 F (37.3 C)  TempSrc: Oral Oral Oral Oral  Resp:  '20 24 20  ' Height:      Weight:      SpO2: 96% 95% 100% 97%    Intake/Output Summary (Last 24 hours) at 07/15/15 0855 Last data filed at 07/15/15 0705  Gross per 24 hour  Intake   2102 ml  Output    200 ml  Net   1902 ml  NAD Non-labored breathing Abdomen appropriately tender, incisions c/d/i Extremities symmetric  Laboratory values:   Recent Labs  07/13/15 0505 07/14/15 0505 07/15/15 0326  WBC 17.0* 16.4* 14.5*  HGB 13.0 12.3* 12.6*  HCT 37.1* 36.4* 36.0*    Recent Labs  07/13/15 0505 07/14/15 0505  NA 138 139  K 3.8 3.8  CL 105 109  CO2 24 23  GLUCOSE 140* 126*  BUN 20  17  CREATININE 1.22 1.09  CALCIUM 8.2* 8.2*   No results for input(s): LABPT, INR in the last 72 hours. No results for input(s): LABURIN in the last 72 hours. Results for orders placed or performed in visit on 07/02/15  CULTURE, URINE COMPREHENSIVE     Status: None   Collection Time: 07/02/15  2:18 PM  Result Value Ref Range Status   Urine Culture, Comprehensive Final report  Final   Result 1 Comment  Final    Comment: No growth in 36 - 48 hours.  Microscopic Examination     Status: Abnormal   Collection Time: 07/02/15  2:19 PM  Result Value Ref Range Status   WBC, UA >30 (A) 0 -  5 /hpf Final   RBC, UA 11-30 (A) 0 -  2 /hpf Final   Epithelial Cells (non renal) 0-10 0 - 10 /hpf Final   Mucus, UA Present (A) Not Estab. Final   Bacteria, UA Few None seen/Few Final    Disposition: Home  Discharge instruction: The patient was instructed to be ambulatory but told to refrain from heavy lifting, strenuous activity, or driving.   Discharge medications:   Medication List    TAKE these medications        docusate sodium 100 MG capsule  Commonly known as:  COLACE  Take 1 capsule (100 mg total) by mouth 2 (two) times daily.     enalapril 20 MG tablet  Commonly known as:  VASOTEC  Take 2 tablets (40 mg total) by mouth daily.     HYDROcodone-acetaminophen 5-325 MG tablet  Commonly known as:  NORCO/VICODIN  Take 1-2 tablets by mouth every 4 (four) hours as needed for moderate pain.     LORazepam 1 MG tablet  Commonly known as:  ATIVAN  Take 1 tablet (1 mg total) by mouth every 8 (eight) hours as needed for anxiety.     omeprazole 20 MG capsule  Commonly known as:  PRILOSEC  TAKE ONE CAPSULE BY MOUTH ONCE DAILY        Followup:      Follow-up Information    Follow up with [name], MD In 1 week.   Specialty:  Urology   Contact information:   [address] [place] [address] [phone]

## 2015-07-15 NOTE — Progress Notes (Signed)
Pt A and O x 4. VSS. Pt tolerating diet well. Minimal complaints of pain with no meds required. IV removed intact, prescriptions given. Pt taught about care of incisions at discharge. Pt voiced understanding of discharge instructions with no further questions. Pt discharged via wheelchair with axillary.

## 2015-07-21 ENCOUNTER — Telehealth: Payer: Self-pay

## 2015-07-21 NOTE — Telephone Encounter (Signed)
Patient's wife called stating that last night patient starting sweating in the middle of the night with chills and she checked his temperature and it was 101.3 she gave him tylenol and fever came down, this morning it is back to normal at 98.7 and patient is feeling fine. She was told that I would consult with the physician in the office( patient is two weeks post nephrectomy) Per Dr. Louis Meckel patient should monitor fever and if his symptoms worsen to call us back to be seen. Patient's wife was notified of this and she stated patient was now having chills again and his temp was back to 101.3. Per Dr. Louis Meckel she was instructed to give tylenol again and to monitor and if his symptoms or fever worsens to call us back and patient will be seen for further evaluation.

## 2015-07-24 ENCOUNTER — Encounter: Payer: Self-pay | Admitting: Urology

## 2015-07-24 ENCOUNTER — Ambulatory Visit: Payer: BLUE CROSS/BLUE SHIELD | Admitting: Urology

## 2015-07-25 ENCOUNTER — Ambulatory Visit (INDEPENDENT_AMBULATORY_CARE_PROVIDER_SITE_OTHER): Payer: BLUE CROSS/BLUE SHIELD | Admitting: Urology

## 2015-07-25 ENCOUNTER — Encounter: Payer: Self-pay | Admitting: Urology

## 2015-07-25 VITALS — BP 119/74 | HR 75 | Temp 98.4°F | Ht 74.0 in | Wt 235.5 lb

## 2015-07-25 DIAGNOSIS — N2889 Other specified disorders of kidney and ureter: Secondary | ICD-10-CM

## 2015-07-25 LAB — MICROSCOPIC EXAMINATION
BACTERIA UA: NONE SEEN
EPITHELIAL CELLS (NON RENAL): NONE SEEN /HPF (ref 0–10)

## 2015-07-25 LAB — URINALYSIS, COMPLETE
Bilirubin, UA: NEGATIVE
Glucose, UA: NEGATIVE
KETONES UA: NEGATIVE
NITRITE UA: NEGATIVE
PH UA: 5.5 (ref 5.0–7.5)
Specific Gravity, UA: 1.025 (ref 1.005–1.030)
Urobilinogen, Ur: 0.2 mg/dL (ref 0.2–1.0)

## 2015-07-25 NOTE — Progress Notes (Signed)
07/25/2015 9:23 AM   Nicholas Thompson 1955/04/14 [name]  Referring provider: Owens Loffler, MD [place] [address] [place],  [address]  Chief Complaint  Patient presents with  . Routine Post Op    HAND ASSISTED LAPAROSCOPIC NEPHRECTOMY (Right )    HPI: The patient is a 60 year old gentleman with history of right renal mass who returns postoperatively for follow-up after right nephrectomy. The patient is doing well at this time. He shall he was quite weak following surgery. He'll use a walker to get around. This has resolved. He is tolerating diet. He is having normal bowel movements. Pain is controlled.  His pathology showed states pT3a renal cell carcinoma with negative margins. The tumor was 4.6 cm in size.   PMH: Past Medical History  Diagnosis Date  . Other and unspecified hyperlipidemia   . Unspecified essential hypertension   . History of colonic polyps   . Basal cell carcinoma     bASAL CELL  . GERD (gastroesophageal reflux disease)   . Chronic kidney disease   . Arthritis   . Nephrolithiasis     Surgical History: Past Surgical History  Procedure Laterality Date  . Arthroscopy of knee Right 1989  . Vasectomy    . Esophageal dilation  1998 & 2001&2006    3 times  . Shoulder arthroscopy w/ subacromial decompression and distal clavicle excision Left 08-27-2007  . Mohs surgery Left 04/2015    [facility]  . Ureteroscopy with holmium laser lithotripsy Bilateral 06/11/2015    Procedure: URETEROSCOPY WITH HOLMIUM LASER LITHOTRIPSY;  Surgeon: [name], MD;  Location: [facility];  Service: Urology;  Laterality: Bilateral;  . Cystoscopy with stent placement Left 06/11/2015    Procedure: CYSTOSCOPY WITH STENT PLACEMENT/ bilateral retrograde pyelogram;  Surgeon: [name], MD;  Location: [facility];  Service: Urology;  Laterality: Left;  . Laparoscopic nephrectomy, hand assisted Right 07/09/2015    Procedure: HAND ASSISTED  LAPAROSCOPIC NEPHRECTOMY;  Surgeon: [name], MD;  Location: [facility];  Service: Urology;  Laterality: Right;    Home Medications:    Medication List       This list is accurate as of: 07/25/15  9:23 AM.  Always use your most recent med list.               docusate sodium 100 MG capsule  Commonly known as:  COLACE  Take 1 capsule (100 mg total) by mouth 2 (two) times daily.     enalapril 20 MG tablet  Commonly known as:  VASOTEC  Take 2 tablets (40 mg total) by mouth daily.     HYDROcodone-acetaminophen 5-325 MG tablet  Commonly known as:  NORCO/VICODIN  Take 1-2 tablets by mouth every 4 (four) hours as needed for moderate pain.     LORazepam 1 MG tablet  Commonly known as:  ATIVAN  Take 1 tablet (1 mg total) by mouth every 8 (eight) hours as needed for anxiety.     omeprazole 20 MG capsule  Commonly known as:  PRILOSEC  TAKE ONE CAPSULE BY MOUTH ONCE DAILY        Allergies: No Known Allergies  Family History: Family History  Problem Relation Age of Onset  . Cancer Mother     bladder  . Diabetes Father   . Hyperlipidemia Father   . Coronary artery disease Father   . Cancer Paternal Uncle     brain  . Stroke Maternal Grandfather   .  Cancer Paternal Grandfather     lung  . Prostate cancer Neg Hx     Social History:  reports that he has never smoked. He has never used smokeless tobacco. He reports that he drinks alcohol. He reports that he does not use illicit drugs.  ROS: UROLOGY Frequent Urination?: No Hard to postpone urination?: No Burning/pain with urination?: No Get up at night to urinate?: No Leakage of urine?: No Urine stream starts and stops?: No Trouble starting stream?: No Do you have to strain to urinate?: No Blood in urine?: No Urinary tract infection?: No Sexually transmitted disease?: No Injury to kidneys or bladder?: No Painful intercourse?: No Weak stream?: No Erection problems?: No Penile pain?:  No  Gastrointestinal Nausea?: No Vomiting?: No Indigestion/heartburn?: No Diarrhea?: No Constipation?: No  Constitutional Fever: No Night sweats?: Yes Weight loss?: Yes Fatigue?: No  Skin Skin rash/lesions?: No Itching?: No  Eyes Blurred vision?: No Double vision?: No  Ears/Nose/Throat Sore throat?: No Sinus problems?: No  Hematologic/Lymphatic Swollen glands?: No Easy bruising?: No  Cardiovascular Leg swelling?: No Chest pain?: No  Respiratory Cough?: No Shortness of breath?: No  Endocrine Excessive thirst?: No  Musculoskeletal Back pain?: No Joint pain?: No  Neurological Headaches?: No Dizziness?: No  Psychologic Depression?: No Anxiety?: No  Physical Exam: BP 119/74 mmHg  Pulse 75  Temp(Src) 98.4 F (36.9 C) (Oral)  Ht 6\' 2"  (1.88 m)  Wt 235 lb 8 oz (106.822 kg)  BMI 30.22 kg/m2  Constitutional:  Alert and oriented, No acute distress. HEENT: [place] AT, moist mucus membranes.  Trachea midline, no masses. Cardiovascular: No clubbing, cyanosis, or edema. Respiratory: Normal respiratory effort, no increased work of breathing. GI: Abdomen is soft, nontender, nondistended, no abdominal masses GU: No CVA tenderness. Incisions clean dry and intact. Skin: No rashes, bruises or suspicious lesions. Lymph: No cervical or inguinal adenopathy. Neurologic: Grossly intact, no focal deficits, moving all 4 extremities. Psychiatric: Normal mood and affect.  Laboratory Data: Lab Results  Component Value Date   WBC 14.5* 07/15/2015   HGB 12.6* 07/15/2015   HCT 36.0* 07/15/2015   MCV 88.9 07/15/2015   PLT 210 07/15/2015    Lab Results  Component Value Date   CREATININE 1.09 07/14/2015    Lab Results  Component Value Date   PSA 1.21 01/28/2015   PSA 1.44 06/25/2013   PSA 1.40 06/26/2012    No results found for: TESTOSTERONE  Lab Results  Component Value Date   HGBA1C 5.2 01/28/2015    Urinalysis    Component Value Date/Time    APPEARANCEUR Cloudy* 07/02/2015 1419   GLUCOSEU Negative 07/02/2015 1419   BILIRUBINUR Negative 07/02/2015 1419   PROTEINUR Negative 07/02/2015 1419   NITRITE Negative 07/02/2015 1419   LEUKOCYTESUR 1+* 07/02/2015 1419      Assessment & Plan:   1. Right's P T3a clear cell renal cell carcinoma -The patient will need surveillance imaging of CT chest/abdomen/pelvis in 6 months. He will follow-up with me after this study. He will then need CT abdomen and pelvis every 6 months for 3 years along with a chest x-ray every 6 months. After that, he will need yearly imaging up to at least 5 years.  3. Nonobstructing left lower pole stone Will monitor on above imaging    Return in about 6 months (around 01/25/2016) for with CT chest/abd/pelvis prior.  [name], MD  [facility] [address] [place], [place] [address] [phone]

## 2015-07-30 ENCOUNTER — Encounter: Payer: Self-pay | Admitting: Family Medicine

## 2015-07-30 ENCOUNTER — Ambulatory Visit (INDEPENDENT_AMBULATORY_CARE_PROVIDER_SITE_OTHER): Payer: BLUE CROSS/BLUE SHIELD | Admitting: Family Medicine

## 2015-07-30 VITALS — BP 138/76 | HR 76 | Temp 98.1°F | Ht 74.0 in | Wt 232.8 lb

## 2015-07-30 DIAGNOSIS — J189 Pneumonia, unspecified organism: Secondary | ICD-10-CM

## 2015-07-30 DIAGNOSIS — Z905 Acquired absence of kidney: Secondary | ICD-10-CM | POA: Diagnosis not present

## 2015-07-30 DIAGNOSIS — C641 Malignant neoplasm of right kidney, except renal pelvis: Secondary | ICD-10-CM | POA: Diagnosis not present

## 2015-07-30 HISTORY — DX: Acquired absence of kidney: Z90.5

## 2015-07-30 HISTORY — DX: Malignant neoplasm of right kidney, except renal pelvis: C64.1

## 2015-07-30 MED ORDER — LEVOFLOXACIN 500 MG PO TABS: 500.0000 mg | ORAL_TABLET | Freq: Every day | ORAL | Status: DC

## 2015-07-30 NOTE — Progress Notes (Signed)
Pre visit review using our clinic review tool, if applicable. No additional management support is needed unless otherwise documented below in the visit note. 

## 2015-07-30 NOTE — Progress Notes (Signed)
Dr. Frederico Hamman T. Secily Walthour, MD, [facility] [place], [address] Phone: [phone] Fax: [phone]  07/30/2015  Patient: Nicholas Thompson, MRN: [name], DOB: 1955/04/20, 60 y.o.  Primary Physician:  Owens Loffler, MD   Chief Complaint  Patient presents with  . Cough    x 3 weeks-Had Kidney removed recently   Subjective:   Nicholas Thompson is a 60 y.o. very pleasant male patient who presents with the following:  Recent renal cell carcinoma, s/p nephrectomy:  3 weeks ago, the patient had a nephrectomy on the right side for his renal cell carcinoma, and he had some complications postoperatively with profuse vomiting for several days and had an extended stay in the hospital. Since then he has been coughing since his surgery at one point he was having fevers up to 101 even after his discharge.  Coughing all the time.   101 F at home.   Past Medical History, Surgical History, Social History, Family History, Problem List, Medications, and Allergies have been reviewed and updated if relevant.  Patient Active Problem List   Diagnosis Date Noted  . Renal mass 07/09/2015  . COLONIC POLYPS, HX OF 08/07/2008  . HYPERLIPIDEMIA 05/18/2007  . HYPERTENSION 05/18/2007  . HYPERGLYCEMIA 05/18/2007  . CARCINOMA, BASAL CELL, HX OF 05/18/2007    Past Medical History  Diagnosis Date  . Other and unspecified hyperlipidemia   . Unspecified essential hypertension   . History of colonic polyps   . Basal cell carcinoma     bASAL CELL  . GERD (gastroesophageal reflux disease)   . Chronic kidney disease   . Arthritis   . Nephrolithiasis     Past Surgical History  Procedure Laterality Date  . Arthroscopy of knee Right 1989  . Vasectomy    . Esophageal dilation  1998 & 2001&2006    3 times  . Shoulder arthroscopy w/ subacromial decompression and distal clavicle excision Left 08-27-2007  . Mohs surgery Left 04/2015    [facility]  .  Ureteroscopy with holmium laser lithotripsy Bilateral 06/11/2015    Procedure: URETEROSCOPY WITH HOLMIUM LASER LITHOTRIPSY;  Surgeon: [name], MD;  Location: [facility];  Service: Urology;  Laterality: Bilateral;  . Cystoscopy with stent placement Left 06/11/2015    Procedure: CYSTOSCOPY WITH STENT PLACEMENT/ bilateral retrograde pyelogram;  Surgeon: [name], MD;  Location: [facility];  Service: Urology;  Laterality: Left;  . Laparoscopic nephrectomy, hand assisted Right 07/09/2015    Procedure: HAND ASSISTED LAPAROSCOPIC NEPHRECTOMY;  Surgeon: [name], MD;  Location: [facility];  Service: Urology;  Laterality: Right;    Social History   Social History  . Marital Status: Married    Spouse Name: N/A  . Number of Children: 2  . Years of Education: N/A   Occupational History  . sales    Social History Main Topics  . Smoking status: Never Smoker   . Smokeless tobacco: Never Used  . Alcohol Use: 0.0 oz/week    0 Standard drinks or equivalent per week     Comment: occ  . Drug Use: No  . Sexual Activity: Not on file   Other Topics Concern  . Not on file   Social History Narrative    Family History  Problem Relation Age of Onset  . Cancer Mother     bladder  . Diabetes Father   . Hyperlipidemia Father   . Coronary artery disease Father   .  Cancer Paternal Uncle     brain  . Stroke Maternal Grandfather   . Cancer Paternal Grandfather     lung  . Prostate cancer Neg Hx     No Known Allergies  Medication list reviewed and updated in full in [place].  ROS: GEN: Acute illness details above GI: Tolerating PO intake GU: maintaining adequate hydration and urination Pulm: No SOB Interactive and getting along well at home.  Otherwise, ROS is as per the HPI.  Objective:   BP 138/76 mmHg  Pulse 76  Temp(Src) 98.1 F (36.7 C) (Oral)  Ht 6\' 2"  (1.88 m)  Wt 232 lb 12 oz (105.575 kg)  BMI 29.87 kg/m2   GEN: A and O x 3. WDWN. NAD.      ENT: Nose clear, ext NML.  No LAD.  No JVD.  TM's clear. Oropharynx clear.  PULM: Normal WOB, no distress. No crackles, wheezes, rhonchi. CV: RRR, no M/G/R, No rubs, No JVD.   EXT: warm and well-perfused, No c/c/e. PSYCH: Pleasant and conversant.    Laboratory and Imaging Data:   Assessment and Plan:   Walking pneumonia  Renal cell carcinoma of right kidney (HCC)  S/p RIGHT nephrectomy  3 weeks of coughing status post nephrectomy for renal cell carcinoma, history of profuse vomiting, cannot exclude aspiration pneumonia. We'll cover all atypicals along with aspiration coverage with Levaquin.  Continue supportive care for cough.  Follow-up: No Follow-up on file.  New Prescriptions   LEVOFLOXACIN (LEVAQUIN) 500 MG TABLET    Take 1 tablet (500 mg total) by mouth daily.    Signed,  Maud Deed. Lawrie Tunks, MD   Patient's Medications  New Prescriptions   LEVOFLOXACIN (LEVAQUIN) 500 MG TABLET    Take 1 tablet (500 mg total) by mouth daily.  Previous Medications   DOCUSATE SODIUM (COLACE) 100 MG CAPSULE    Take 1 capsule (100 mg total) by mouth 2 (two) times daily.   ENALAPRIL (VASOTEC) 20 MG TABLET    Take 2 tablets (40 mg total) by mouth daily.   OMEPRAZOLE (PRILOSEC) 20 MG CAPSULE    TAKE ONE CAPSULE BY MOUTH ONCE DAILY  Modified Medications   No medications on file  Discontinued Medications   HYDROCODONE-ACETAMINOPHEN (NORCO/VICODIN) 5-325 MG TABLET    Take 1-2 tablets by mouth every 4 (four) hours as needed for moderate pain.   LORAZEPAM (ATIVAN) 1 MG TABLET    Take 1 tablet (1 mg total) by mouth every 8 (eight) hours as needed for anxiety.

## 2015-08-05 ENCOUNTER — Encounter: Payer: Self-pay | Admitting: Family Medicine

## 2015-08-20 ENCOUNTER — Telehealth: Payer: Self-pay | Admitting: Urology

## 2015-08-20 NOTE — Telephone Encounter (Signed)
Please advise 

## 2015-08-20 NOTE — Telephone Encounter (Signed)
Pt called stating that he was hoping to return to work on 09/01/15, but he wanted to check with the doctor to make sure that it was ok first. Pt stated that since he had the surgery on 07/09/15, he has lost 28 lbs and his job does require heavy lifting. Please advise.

## 2015-10-25 ENCOUNTER — Other Ambulatory Visit: Payer: Self-pay | Admitting: Family Medicine

## 2016-01-27 ENCOUNTER — Ambulatory Visit
Admission: RE | Admit: 2016-01-27 | Discharge: 2016-01-27 | Disposition: A | Discharge status: Home / Self Care | Payer: BLUE CROSS/BLUE SHIELD | Source: Ambulatory Visit | Attending: Urology | Admitting: Urology

## 2016-01-27 DIAGNOSIS — M160 Bilateral primary osteoarthritis of hip: Secondary | ICD-10-CM | POA: Diagnosis not present

## 2016-01-27 DIAGNOSIS — I2510 Atherosclerotic heart disease of native coronary artery without angina pectoris: Secondary | ICD-10-CM | POA: Insufficient documentation

## 2016-01-27 DIAGNOSIS — I700 Atherosclerosis of aorta: Secondary | ICD-10-CM | POA: Diagnosis not present

## 2016-01-27 DIAGNOSIS — R918 Other nonspecific abnormal finding of lung field: Secondary | ICD-10-CM | POA: Diagnosis not present

## 2016-01-27 DIAGNOSIS — N200 Calculus of kidney: Secondary | ICD-10-CM | POA: Insufficient documentation

## 2016-01-27 DIAGNOSIS — Z905 Acquired absence of kidney: Secondary | ICD-10-CM | POA: Insufficient documentation

## 2016-01-27 DIAGNOSIS — R935 Abnormal findings on diagnostic imaging of other abdominal regions, including retroperitoneum: Secondary | ICD-10-CM | POA: Diagnosis not present

## 2016-01-27 DIAGNOSIS — N2889 Other specified disorders of kidney and ureter: Secondary | ICD-10-CM | POA: Diagnosis not present

## 2016-01-27 DIAGNOSIS — J9811 Atelectasis: Secondary | ICD-10-CM | POA: Diagnosis not present

## 2016-01-27 LAB — POCT I-STAT CREATININE: CREATININE: 1.1 mg/dL (ref 0.61–1.24)

## 2016-01-27 MED ORDER — IOPAMIDOL (ISOVUE-370) INJECTION 76%
100.0000 mL | Freq: Once | INTRAVENOUS | Status: AC | PRN
  Administered 2016-01-27: 100 mL via INTRAVENOUS

## 2016-01-29 ENCOUNTER — Ambulatory Visit: Payer: BLUE CROSS/BLUE SHIELD | Admitting: Urology

## 2016-01-29 ENCOUNTER — Encounter: Payer: Self-pay | Admitting: Urology

## 2016-01-29 VITALS — BP 149/83 | HR 62 | Ht 74.0 in | Wt 244.7 lb

## 2016-01-29 DIAGNOSIS — Z905 Acquired absence of kidney: Secondary | ICD-10-CM

## 2016-01-29 DIAGNOSIS — C641 Malignant neoplasm of right kidney, except renal pelvis: Secondary | ICD-10-CM | POA: Diagnosis not present

## 2016-01-29 LAB — URINALYSIS, COMPLETE
Bilirubin, UA: NEGATIVE
GLUCOSE, UA: NEGATIVE
KETONES UA: NEGATIVE
LEUKOCYTES UA: NEGATIVE
Nitrite, UA: NEGATIVE
PROTEIN UA: NEGATIVE
RBC UA: NEGATIVE
SPEC GRAV UA: 1.015 (ref 1.005–1.030)
Urobilinogen, Ur: 0.2 mg/dL (ref 0.2–1.0)
pH, UA: 6 (ref 5.0–7.5)

## 2016-01-29 LAB — MICROSCOPIC EXAMINATION
Bacteria, UA: NONE SEEN
EPITHELIAL CELLS (NON RENAL): NONE SEEN /HPF (ref 0–10)

## 2016-01-29 NOTE — Progress Notes (Signed)
01/29/2016 9:49 AM   Nicholas Thompson 1955-09-05 [id number]  Referring provider: Owens Loffler, MD [address] [place],  [address]  Chief Complaint  Patient presents with  . Follow-up    renal cell cancer, Nephroectomy x 15mths     HPI: The patient is a 60 year old gentleman status post right nephrectomy for pT3A clear cell RCC with negative margins. His tumor wass 4.6 cm in size but there was extension into perinephric tissue. He is doing well at this point. His appetite is resumed. He has no complaints at this time. He has no unexpected weight loss or new pain. CT of his chest abdomen and pelvis showed inflammatory changes in the bed of the right nephrectomy site. Also showed his known left lower pole stone and diverticulum. There is evidence of a finding of a 4.2 cm ascending aorta dilation which further imaging follow-up was recommended by the radiologist.   Creatinine 2 days ago was 1.1. 6 months ago it was 1.09.  PMH: Past Medical History:  Diagnosis Date  . Arthritis   . Basal cell carcinoma    bASAL CELL  . Chronic kidney disease   . GERD (gastroesophageal reflux disease)   . History of colonic polyps   . Nephrolithiasis   . Other and unspecified hyperlipidemia   . Renal cell carcinoma of right kidney ([place]) 07/30/2015  . S/p nephrectomy 07/30/2015  . Unspecified essential hypertension     Surgical History: Past Surgical History:  Procedure Laterality Date  . arthroscopy of knee Right 1989  . CYSTOSCOPY WITH STENT PLACEMENT Left 06/11/2015   Procedure: CYSTOSCOPY WITH STENT PLACEMENT/ bilateral retrograde pyelogram;  Surgeon: [name], MD;  Location: [facility];  Service: Urology;  Laterality: Left;  . [place] 2001&2006   3 times  . LAPAROSCOPIC NEPHRECTOMY, HAND ASSISTED Right 07/09/2015   Procedure: HAND ASSISTED LAPAROSCOPIC NEPHRECTOMY;  Surgeon: [name], MD;  Location: [facility];  Service: Urology;  Laterality:  Right;  . MOHS SURGERY Left 04/2015   [facility], [facility]  . SHOULDER ARTHROSCOPY W/ SUBACROMIAL DECOMPRESSION AND DISTAL CLAVICLE EXCISION Left 08-27-2007  . URETEROSCOPY WITH HOLMIUM LASER LITHOTRIPSY Bilateral 06/11/2015   Procedure: URETEROSCOPY WITH HOLMIUM LASER LITHOTRIPSY;  Surgeon: [name], MD;  Location: [facility];  Service: Urology;  Laterality: Bilateral;  . VASECTOMY      Home Medications:    Medication List       Accurate as of 01/29/16  9:49 AM. Always use your most recent med list.          enalapril 20 MG tablet Commonly known as:  VASOTEC TAKE TWO TABLETS BY MOUTH ONCE DAILY   omeprazole 20 MG capsule Commonly known as:  PRILOSEC TAKE ONE CAPSULE BY MOUTH ONCE DAILY       Allergies: No Known Allergies  Family History: Family History  Problem Relation Age of Onset  . Cancer Mother     bladder  . Diabetes Father   . Hyperlipidemia Father   . Coronary artery disease Father   . Cancer Paternal Uncle     brain  . Stroke Maternal Grandfather   . Cancer Paternal Grandfather     lung  . Prostate cancer Neg Hx     Social History:  reports that he has never smoked. He has never used smokeless tobacco. He reports that he drinks alcohol. He reports that he does not use drugs.  ROS: UROLOGY Frequent Urination?: No Hard to postpone urination?: No  Burning/pain with urination?: No Get up at night to urinate?: Yes Leakage of urine?: No Urine stream starts and stops?: No Trouble starting stream?: No Do you have to strain to urinate?: No Blood in urine?: No Urinary tract infection?: No Sexually transmitted disease?: No Injury to kidneys or bladder?: No Painful intercourse?: No Weak stream?: No Erection problems?: No Penile pain?: No  Gastrointestinal Nausea?: No Vomiting?: No Indigestion/heartburn?: Yes Diarrhea?: No Constipation?: No  Constitutional Fever: No Night sweats?: No Weight loss?: No Fatigue?: Yes  Skin Skin rash/lesions?:  No Itching?: No  Eyes Blurred vision?: No Double vision?: No  Ears/Nose/Throat Sore throat?: No Sinus problems?: No  Hematologic/Lymphatic Swollen glands?: No Easy bruising?: No  Cardiovascular Leg swelling?: No Chest pain?: No  Respiratory Cough?: Yes Shortness of breath?: No  Endocrine Excessive thirst?: No  Musculoskeletal Back pain?: No Joint pain?: No  Neurological Headaches?: No Dizziness?: No  Psychologic Depression?: No Anxiety?: No  Physical Exam: BP (!) 149/83   Pulse 62   Ht 6\' 2"  (1.88 m)   Wt 244 lb 11.2 oz (111 kg)   BMI 31.42 kg/m   Constitutional:  Alert and oriented, No acute distress. HEENT: [place] AT, moist mucus membranes.  Trachea midline, no masses. Cardiovascular: No clubbing, cyanosis, or edema. Respiratory: Normal respiratory effort, no increased work of breathing. GI: Abdomen is soft, nontender, nondistended, no abdominal masses GU: No CVA tenderness.  Skin: No rashes, bruises or suspicious lesions. Lymph: No cervical or inguinal adenopathy. Neurologic: Grossly intact, no focal deficits, moving all 4 extremities. Psychiatric: Normal mood and affect.  Laboratory Data: Lab Results  Component Value Date   WBC 14.5 (H) 07/15/2015   HGB 12.6 (L) 07/15/2015   HCT 36.0 (L) 07/15/2015   MCV 88.9 07/15/2015   PLT 210 07/15/2015    Lab Results  Component Value Date   CREATININE 1.10 01/27/2016    Lab Results  Component Value Date   PSA 1.21 01/28/2015   PSA 1.44 06/25/2013   PSA 1.40 06/26/2012    No results found for: TESTOSTERONE  Lab Results  Component Value Date   HGBA1C 5.2 01/28/2015    Urinalysis    Component Value Date/Time   APPEARANCEUR Clear 07/25/2015 0843   GLUCOSEU Negative 07/25/2015 0843   BILIRUBINUR Negative 07/25/2015 0843   PROTEINUR Trace (A) 07/25/2015 0843   NITRITE Negative 07/25/2015 0843   LEUKOCYTESUR Trace (A) 07/25/2015 0843    Pertinent Imaging: Reviewed as above  Assessment &  Plan:    1. Right RCC s/p right nephrectomy -doing well. No evidence of disease -Will continue surveillance CT chest abdomen and pelvis every 6 months as recommended by AUA guidelines  2. Aortic dilation I gave the patient which included the recommendation for annual CTA or MRA of his 4.2 cm ascending aorta. He will discuss this with his PCP. I'm not sure that he needs this as he is getting surveillance CTs every 6 months, however they are not CTA studies. The patient voiced an understanding, and he will discuss this further with his PCP.  Return in about 6 months (around 07/28/2016) for with CT prior.  [name], MD  [facility] [address], [place], [place] [address] [phone]

## 2016-02-27 ENCOUNTER — Ambulatory Visit (INDEPENDENT_AMBULATORY_CARE_PROVIDER_SITE_OTHER): Payer: BLUE CROSS/BLUE SHIELD

## 2016-02-27 DIAGNOSIS — Z23 Encounter for immunization: Secondary | ICD-10-CM

## 2016-03-24 ENCOUNTER — Other Ambulatory Visit: Payer: Self-pay | Admitting: Family Medicine

## 2016-03-26 ENCOUNTER — Telehealth: Payer: Self-pay | Admitting: Family Medicine

## 2016-03-26 NOTE — Telephone Encounter (Signed)
[place] Patient Name: Nicholas Thompson DOB: July 05, 1955 Initial Comment Caller states he is losing feeling in his right hand fingers. Nurse Assessment Nurse: Markus Daft, RN, Sherre Poot Date/Time (Eastern Time): 03/26/2016 1:56:02 PM Confirm and document reason for call. If symptomatic, describe symptoms. ---Caller states he is loosing feeling in his right hand fingers. Started a month ago. Does the patient have any new or worsening symptoms? ---Yes Will a triage be completed? ---Yes Related visit to physician within the last 2 weeks? ---No Does the PT have any chronic conditions? (i.e. diabetes, asthma, etc.) ---No Is this a behavioral health or substance abuse call? ---No Guidelines Guideline Title Affirmed Question Affirmed Notes Neurologic Deficit [1] Numbness (i.e., loss of sensation) of the face, arm / hand, or leg / foot on one side of the body AND [2] gradual onset (e.g., days to weeks) AND [3] present now Final Disposition User See Physician within 4 Hours (or PCP triage) Markus Daft, RN, [place] Referrals GO TO FACILITY REFUSED Disagree/Comply: Disagree Disagree/Comply Reason: Disagree with instructions Does not plan to go to UC.

## 2016-03-26 NOTE — Telephone Encounter (Signed)
Pt has appt to see Dr Lorelei Pont on 03/31/16 at 3:30.

## 2016-03-28 NOTE — Telephone Encounter (Signed)
Agree with f/u with me.

## 2016-03-31 ENCOUNTER — Ambulatory Visit (INDEPENDENT_AMBULATORY_CARE_PROVIDER_SITE_OTHER): Payer: BLUE CROSS/BLUE SHIELD | Admitting: Family Medicine

## 2016-03-31 ENCOUNTER — Encounter: Payer: Self-pay | Admitting: Family Medicine

## 2016-03-31 VITALS — BP 130/82 | HR 65 | Temp 97.9°F | Ht 74.0 in | Wt 252.5 lb

## 2016-03-31 DIAGNOSIS — G5603 Carpal tunnel syndrome, bilateral upper limbs: Secondary | ICD-10-CM

## 2016-03-31 NOTE — Progress Notes (Signed)
Dr. Frederico Hamman T. Daymien Goth, MD, [facility] [place], [address] Phone: [phone] Fax: [phone]  03/31/2016  Patient: Nicholas Thompson, MRN: [id number], DOB: 06-16-1955, 61 y.o.  Primary Physician:  Owens Loffler, MD   Chief Complaint  Patient presents with  . Numbness    Bilateral Fingers   Subjective:   Nicholas Thompson is a 60 y.o. very pleasant male patient who presents with the following:  CTS: The patent presents a > 1 year history of numbness and tingling, greatest in medial aspect of hands, some in ulnar aspect. Some weakness with grip strength. Shome occ. pain going to forearm. Bothers the most at night. Aggravated by lifting arms above head and at night.   R > L  Splints? Yes, no improvement Prior Injecton: none EMG: none Hand of Dominance: R   Past Medical History, Surgical History, Social History, Family History, Problem List, Medications, and Allergies have been reviewed and updated if relevant.  Patient Active Problem List   Diagnosis Date Noted  . Renal cell carcinoma of right kidney ([place]) 07/30/2015  . S/p RIGHT nephrectomy 07/30/2015  . COLONIC POLYPS, HX OF 08/07/2008  . HYPERLIPIDEMIA 05/18/2007  . HYPERTENSION 05/18/2007  . HYPERGLYCEMIA 05/18/2007  . CARCINOMA, BASAL CELL, HX OF 05/18/2007    Past Medical History:  Diagnosis Date  . Arthritis   . Basal cell carcinoma    bASAL CELL  . Chronic kidney disease   . GERD (gastroesophageal reflux disease)   . History of colonic polyps   . Nephrolithiasis   . Other and unspecified hyperlipidemia   . Renal cell carcinoma of right kidney ([place]) 07/30/2015  . S/p nephrectomy 07/30/2015  . Unspecified essential hypertension     Past Surgical History:  Procedure Laterality Date  . arthroscopy of knee Right 1989  . CYSTOSCOPY WITH STENT PLACEMENT Left 06/11/2015   Procedure: CYSTOSCOPY WITH STENT PLACEMENT/ bilateral retrograde pyelogram;   Surgeon: [name], MD;  Location: [facility];  Service: Urology;  Laterality: Left;  . [place] 2001&2006   3 times  . LAPAROSCOPIC NEPHRECTOMY, HAND ASSISTED Right 07/09/2015   Procedure: HAND ASSISTED LAPAROSCOPIC NEPHRECTOMY;  Surgeon: [name], MD;  Location: [facility];  Service: Urology;  Laterality: Right;  . MOHS SURGERY Left 04/2015   [facility]  . SHOULDER ARTHROSCOPY W/ SUBACROMIAL DECOMPRESSION AND DISTAL CLAVICLE EXCISION Left 08-27-2007  . URETEROSCOPY WITH HOLMIUM LASER LITHOTRIPSY Bilateral 06/11/2015   Procedure: URETEROSCOPY WITH HOLMIUM LASER LITHOTRIPSY;  Surgeon: [name], MD;  Location: [facility];  Service: Urology;  Laterality: Bilateral;  . VASECTOMY      Social History   Social History  . Marital status: Married    Spouse name: N/A  . Number of children: 2  . Years of education: N/A   Occupational History  . sales    Social History Main Topics  . Smoking status: Never Smoker  . Smokeless tobacco: Never Used  . Alcohol use 0.0 oz/week     Comment: occ  . Drug use: No  . Sexual activity: Not on file   Other Topics Concern  . Not on file   Social History Narrative  . No narrative on file    Family History  Problem Relation Age of Onset  . Cancer Mother     bladder  . Diabetes Father   . Hyperlipidemia Father   . Coronary artery disease Father   .  Cancer Paternal Uncle     brain  . Stroke Maternal Grandfather   . Cancer Paternal Grandfather     lung  . Prostate cancer Neg Hx     No Known Allergies  Medication list reviewed and updated in full in [place].  GEN: no acute illness or fever CV: No chest pain or shortness of breath MSK: detailed above Neuro: neurological signs are described above ROS O/w per HPI  Objective:   BP 130/82   Pulse 65   Temp 97.9 F (36.6 C) (Oral)   Ht 6\' 2"  (1.88 m)   Wt 252 lb 8 oz (114.5 kg)   BMI 32.42 kg/m    GEN: WDWN, NAD, Non-toxic, Alert &  Oriented x 3 HEENT: Atraumatic, Normocephalic.  Ears and Nose: No external deformity. EXTR: No clubbing/cyanosis/edema NEURO: Normal gait.  PSYCH: Normally interactive. Conversant. Not depressed or anxious appearing.  Calm demeanor.   Hand: B Ecchymosis or edema: neg ROM wrist/hand/digits/elbow: full  Carpals, MCP's, digits: NT Distal Ulna and Radius: NT Supination lift test: neg Ecchymosis or edema: neg Cysts/nodules: neg Finkelstein's test: neg Snuffbox tenderness: neg Scaphoid tubercle: NT Hook of Hamate: NT Resisted supination: NT Full composite fist Grip, all digits: 5/5 str No tenosynovitis Axial load test: neg Phalen's: + Tinel's: + Atrophy: neg  Hand sensation: intact   Radiology: No results found.  Assessment and Plan:   Bilateral carpal tunnel syndrome - Plan: NCV with EMG(electromyography)  Carpal Tunnel Syndrome: We discussed the anatomy involved, and that carpal tunnel syndrome primarily involves the median nerve, and this typically affects digits one through 3. We also discussed that mild cases of carpal tunnel syndrome are often improved with night splints, and it is very reasonable to consider a carpal tunnel injection. If the patient does have moderate to severe carpal tunnel syndrome based on NCV, then it is certainly reasonable to consider carpal tunnel release, which was discussed with the patient. We also discussed his severe carpal tunnel syndrome can lead to permanent nerve impairment even if released.   At this point, the patient has symptoms at baseline that are impairing his function, he has been bracing for years, and I recommended getting NCV/EMG to assess the extent of damage.  Follow-up: No Follow-up on file.  Meds ordered this encounter  Medications  . DISCONTD: ketoconazole (NIZORAL) 2 % cream   Medications Discontinued During This Encounter  Medication Reason  . ketoconazole (NIZORAL) 2 % cream Completed Course   Orders Placed This  Encounter  Procedures  . NCV with EMG(electromyography)    Signed,  Frederico Hamman T. Abdalrahman Clementson, MD   Allergies as of 03/31/2016   No Known Allergies     Medication List       Accurate as of 03/31/16 11:59 PM. Always use your most recent med list.          enalapril 20 MG tablet Commonly known as:  VASOTEC TAKE TWO TABLETS BY MOUTH ONCE DAILY   omeprazole 20 MG capsule Commonly known as:  PRILOSEC TAKE ONE CAPSULE BY MOUTH ONCE DAILY

## 2016-03-31 NOTE — Progress Notes (Signed)
Pre visit review using our clinic review tool, if applicable. No additional management support is needed unless otherwise documented below in the visit note. 

## 2016-03-31 NOTE — Patient Instructions (Signed)

## 2016-04-05 ENCOUNTER — Other Ambulatory Visit: Payer: Self-pay | Admitting: Family Medicine

## 2016-05-26 DIAGNOSIS — C44619 Basal cell carcinoma of skin of left upper limb, including shoulder: Secondary | ICD-10-CM | POA: Diagnosis not present

## 2016-05-26 DIAGNOSIS — D485 Neoplasm of uncertain behavior of skin: Secondary | ICD-10-CM | POA: Diagnosis not present

## 2016-05-26 DIAGNOSIS — L570 Actinic keratosis: Secondary | ICD-10-CM | POA: Diagnosis not present

## 2016-05-26 DIAGNOSIS — C44319 Basal cell carcinoma of skin of other parts of face: Secondary | ICD-10-CM | POA: Diagnosis not present

## 2016-05-26 DIAGNOSIS — Z85828 Personal history of other malignant neoplasm of skin: Secondary | ICD-10-CM | POA: Diagnosis not present

## 2016-06-09 DIAGNOSIS — C44619 Basal cell carcinoma of skin of left upper limb, including shoulder: Secondary | ICD-10-CM | POA: Diagnosis not present

## 2016-06-09 DIAGNOSIS — C44319 Basal cell carcinoma of skin of other parts of face: Secondary | ICD-10-CM | POA: Diagnosis not present

## 2016-06-09 DIAGNOSIS — C4491 Basal cell carcinoma of skin, unspecified: Secondary | ICD-10-CM | POA: Diagnosis not present

## 2016-06-20 ENCOUNTER — Other Ambulatory Visit: Payer: Self-pay | Admitting: Family Medicine

## 2016-07-05 ENCOUNTER — Ambulatory Visit (INDEPENDENT_AMBULATORY_CARE_PROVIDER_SITE_OTHER): Payer: BLUE CROSS/BLUE SHIELD | Admitting: Family Medicine

## 2016-07-05 ENCOUNTER — Encounter: Payer: Self-pay | Admitting: Family Medicine

## 2016-07-05 VITALS — BP 140/70 | HR 59 | Temp 98.2°F | Ht 74.0 in | Wt 256.8 lb

## 2016-07-05 DIAGNOSIS — K219 Gastro-esophageal reflux disease without esophagitis: Secondary | ICD-10-CM

## 2016-07-05 MED ORDER — OMEPRAZOLE 20 MG PO CPDR: 20.0000 mg | DELAYED_RELEASE_CAPSULE | Freq: Every day | ORAL | 3 refills | Status: DC

## 2016-07-05 NOTE — Progress Notes (Signed)
Dr. Frederico Hamman T. Kelicia Youtz, MD, [facility] Medicine [place], [address] Phone: [phone] Fax: [phone]  07/05/2016  Patient: Nicholas Thompson, MRN: [id number], DOB: 1955/07/14, 61 y.o.  Primary Physician:  Owens Loffler, MD   Chief Complaint  Patient presents with  . Medication Refill    Omeprazole   Subjective:   Nicholas Thompson is a 61 y.o. very pleasant male patient who presents with the following:  GERD is stable, asx - wants refill of PPI  Also could not do NCV/EMG. Too recent to nephrectomy.  Past Medical History, Surgical History, Social History, Family History, Problem List, Medications, and Allergies have been reviewed and updated if relevant.  Patient Active Problem List   Diagnosis Date Noted  . Renal cell carcinoma of right kidney ([place]) 07/30/2015  . S/p RIGHT nephrectomy 07/30/2015  . COLONIC POLYPS, HX OF 08/07/2008  . HYPERLIPIDEMIA 05/18/2007  . HYPERTENSION 05/18/2007  . HYPERGLYCEMIA 05/18/2007  . CARCINOMA, BASAL CELL, HX OF 05/18/2007    Past Medical History:  Diagnosis Date  . Arthritis   . Basal cell carcinoma    bASAL CELL  . Chronic kidney disease   . GERD (gastroesophageal reflux disease)   . History of colonic polyps   . Nephrolithiasis   . Other and unspecified hyperlipidemia   . Renal cell carcinoma of right kidney ([place]) 07/30/2015  . S/p nephrectomy 07/30/2015  . Unspecified essential hypertension     Past Surgical History:  Procedure Laterality Date  . arthroscopy of knee Right 1989  . CYSTOSCOPY WITH STENT PLACEMENT Left 06/11/2015   Procedure: CYSTOSCOPY WITH STENT PLACEMENT/ bilateral retrograde pyelogram;  Surgeon: [name], MD;  Location: [facility];  Service: Urology;  Laterality: Left;  . [place] 2001&2006   3 times  . LAPAROSCOPIC NEPHRECTOMY, HAND ASSISTED Right 07/09/2015   Procedure: HAND ASSISTED LAPAROSCOPIC NEPHRECTOMY;  Surgeon: [name], MD;  Location: [facility];  Service: Urology;  Laterality: Right;  . MOHS SURGERY Left 04/2015   [facility], [facility]  . SHOULDER ARTHROSCOPY W/ SUBACROMIAL DECOMPRESSION AND DISTAL CLAVICLE EXCISION Left 08-27-2007  . URETEROSCOPY WITH HOLMIUM LASER LITHOTRIPSY Bilateral 06/11/2015   Procedure: URETEROSCOPY WITH HOLMIUM LASER LITHOTRIPSY;  Surgeon: [name], MD;  Location: [facility];  Service: Urology;  Laterality: Bilateral;  . VASECTOMY      Social History   Social History  . Marital status: Married    Spouse name: N/A  . Number of children: 2  . Years of education: N/A   Occupational History  . sales    Social History Main Topics  . Smoking status: Never Smoker  . Smokeless tobacco: Never Used  . Alcohol use 0.0 oz/week     Comment: occ  . Drug use: No  . Sexual activity: Not on file   Other Topics Concern  . Not on file   Social History Narrative  . No narrative on file    Family History  Problem Relation Age of Onset  . Cancer Mother     bladder  . Diabetes Father   . Hyperlipidemia Father   . Coronary artery disease Father   . Cancer Paternal Uncle     brain  . Stroke Maternal Grandfather   . Cancer Paternal Grandfather     lung  . Prostate cancer Neg Hx     Allergies  Allergen Reactions  . Vicodin [Hydrocodone-Acetaminophen] Nausea And Vomiting    Medication  list reviewed and updated in full in [place].   GEN: No acute illnesses, no fevers, chills. GI: No n/v/d, eating normally Pulm: No SOB Interactive and getting along well at home.  Otherwise, ROS is as per the HPI.  Objective:   BP 140/70   Pulse (!) 59   Temp 98.2 F (36.8 C) (Oral)   Ht 6\' 2"  (1.88 m)   Wt 256 lb 12 oz (116.5 kg)   BMI 32.96 kg/m   GEN: WDWN, NAD, Non-toxic, A & O x 3 HEENT: Atraumatic, Normocephalic. Neck supple. No masses, No LAD. Ears and Nose: No external deformity. EXTR: No c/c/e NEURO Normal gait.  PSYCH: Normally interactive. Conversant. Not  depressed or anxious appearing.  Calm demeanor.   Laboratory and Imaging Data:  Assessment and Plan:   Gastroesophageal reflux disease, esophagitis presence not specified  Stable GERD  Options for CTS include injection. Already bracing. Hand consultation. Possibly NCV with benzo prior.   Follow-up: No Follow-up on file.  Meds ordered this encounter  Medications  . omeprazole (PRILOSEC) 20 MG capsule    Sig: Take 1 capsule (20 mg total) by mouth daily.    Dispense:  90 capsule    Refill:  3   Medications Discontinued During This Encounter  Medication Reason  . omeprazole (PRILOSEC) 20 MG capsule Reorder   Signed,  Frederico Hamman T. Novaleigh Kohlman, MD   Allergies as of 07/05/2016      Reactions   Vicodin [hydrocodone-acetaminophen] Nausea And Vomiting      Medication List       Accurate as of 07/05/16 10:06 AM. Always use your most recent med list.          enalapril 20 MG tablet Commonly known as:  VASOTEC TAKE TWO TABLETS BY MOUTH ONCE DAILY   omeprazole 20 MG capsule Commonly known as:  PRILOSEC Take 1 capsule (20 mg total) by mouth daily.

## 2016-07-05 NOTE — Progress Notes (Signed)
Pre visit review using our clinic review tool, if applicable. No additional management support is needed unless otherwise documented below in the visit note. 

## 2016-07-29 ENCOUNTER — Ambulatory Visit: Payer: BLUE CROSS/BLUE SHIELD

## 2016-08-02 ENCOUNTER — Ambulatory Visit
Admission: RE | Admit: 2016-08-02 | Discharge: 2016-08-02 | Disposition: A | Discharge status: Home / Self Care | Payer: BLUE CROSS/BLUE SHIELD | Source: Ambulatory Visit | Attending: Urology | Admitting: Urology

## 2016-08-02 DIAGNOSIS — N200 Calculus of kidney: Secondary | ICD-10-CM | POA: Insufficient documentation

## 2016-08-02 DIAGNOSIS — I700 Atherosclerosis of aorta: Secondary | ICD-10-CM | POA: Insufficient documentation

## 2016-08-02 DIAGNOSIS — Z905 Acquired absence of kidney: Secondary | ICD-10-CM | POA: Diagnosis not present

## 2016-08-02 DIAGNOSIS — C641 Malignant neoplasm of right kidney, except renal pelvis: Secondary | ICD-10-CM | POA: Diagnosis not present

## 2016-08-02 DIAGNOSIS — R079 Chest pain, unspecified: Secondary | ICD-10-CM | POA: Diagnosis not present

## 2016-08-02 LAB — POCT I-STAT CREATININE: CREATININE: 1.1 mg/dL (ref 0.61–1.24)

## 2016-08-02 MED ORDER — IOPAMIDOL (ISOVUE-370) INJECTION 76%
100.0000 mL | Freq: Once | INTRAVENOUS | Status: AC | PRN
  Administered 2016-08-02: 100 mL via INTRAVENOUS

## 2016-08-06 ENCOUNTER — Encounter: Payer: Self-pay | Admitting: Urology

## 2016-08-06 ENCOUNTER — Ambulatory Visit (INDEPENDENT_AMBULATORY_CARE_PROVIDER_SITE_OTHER): Payer: BLUE CROSS/BLUE SHIELD | Admitting: Urology

## 2016-08-06 VITALS — BP 163/85 | HR 59 | Ht 74.0 in | Wt 252.5 lb

## 2016-08-06 DIAGNOSIS — C641 Malignant neoplasm of right kidney, except renal pelvis: Secondary | ICD-10-CM | POA: Diagnosis not present

## 2016-08-06 NOTE — Progress Notes (Signed)
08/06/2016 4:21 PM   Nicholas Thompson [birth year] [id number]  Referring provider: Owens Loffler, MD [address] [place], [place] [address]  Chief Complaint  Patient presents with  . Results    HPI: The patient is a 61 year old gentleman status post right nephrectomy for pT3a clear cell RCC with negative margins. His tumor wass 4.6 cm in size but there was extension into perinephric tissue (April 2017). He is doing well at this point. He has no complaints at this time. He has no unexpected weight loss or new pain. CT of his chest, abdomen, and pelvis show no evidence of disease.  Creatinine in May 2018 is 1.1 which is stable.    PMH: Past Medical History:  Diagnosis Date  . Arthritis   . Basal cell carcinoma    bASAL CELL  . Chronic kidney disease   . GERD (gastroesophageal reflux disease)   . History of colonic polyps   . Nephrolithiasis   . Other and unspecified hyperlipidemia   . Renal cell carcinoma of right kidney ([place]) 07/30/2015  . S/p nephrectomy 07/30/2015  . Unspecified essential hypertension     Surgical History: Past Surgical History:  Procedure Laterality Date  . arthroscopy of knee Right 1989  . CYSTOSCOPY WITH STENT PLACEMENT Left 06/11/2015   Procedure: CYSTOSCOPY WITH STENT PLACEMENT/ bilateral retrograde pyelogram;  Surgeon: [name], MD;  Location: [facility];  Service: Urology;  Laterality: Left;  . [place] 2001&2006   3 times  . LAPAROSCOPIC NEPHRECTOMY, HAND ASSISTED Right 07/09/2015   Procedure: HAND ASSISTED LAPAROSCOPIC NEPHRECTOMY;  Surgeon: [name], MD;  Location: [facility];  Service: Urology;  Laterality: Right;  . MOHS SURGERY Left 04/2015   [facility], [facility]  . SHOULDER ARTHROSCOPY W/ SUBACROMIAL DECOMPRESSION AND DISTAL CLAVICLE EXCISION Left 08-27-2007  . URETEROSCOPY WITH HOLMIUM LASER LITHOTRIPSY Bilateral 06/11/2015   Procedure: URETEROSCOPY WITH HOLMIUM LASER LITHOTRIPSY;  Surgeon: [name],  MD;  Location: [facility];  Service: Urology;  Laterality: Bilateral;  . VASECTOMY      Home Medications:  Allergies as of 08/06/2016      Reactions   Vicodin [hydrocodone-acetaminophen] Nausea And Vomiting      Medication List       Accurate as of 08/06/16  4:21 PM. Always use your most recent med list.          enalapril 20 MG tablet Commonly known as:  VASOTEC TAKE TWO TABLETS BY MOUTH ONCE DAILY   omeprazole 20 MG capsule Commonly known as:  PRILOSEC Take 1 capsule (20 mg total) by mouth daily.       Allergies:  Allergies  Allergen Reactions  . Vicodin [Hydrocodone-Acetaminophen] Nausea And Vomiting    Family History: Family History  Problem Relation Age of Onset  . Cancer Mother        bladder  . Diabetes Father   . Hyperlipidemia Father   . Coronary artery disease Father   . Cancer Paternal Uncle        brain  . Stroke Maternal Grandfather   . Cancer Paternal Grandfather        lung  . Prostate cancer Neg Hx     Social History:  reports that he has never smoked. He has never used smokeless tobacco. He reports that he drinks alcohol. He reports that he does not use drugs.  ROS: UROLOGY Frequent Urination?: Yes Hard to postpone urination?: No Burning/pain with urination?: No Get up at night to urinate?:  Yes Leakage of urine?: No Urine stream starts and stops?: No Trouble starting stream?: No Do you have to strain to urinate?: No Blood in urine?: No Urinary tract infection?: No Sexually transmitted disease?: No Injury to kidneys or bladder?: No Painful intercourse?: No Weak stream?: No Erection problems?: No Penile pain?: No  Gastrointestinal Nausea?: No Vomiting?: No Indigestion/heartburn?: No Diarrhea?: No Constipation?: No  Constitutional Fever: No Night sweats?: No Weight loss?: No Fatigue?: No  Skin Skin rash/lesions?: No Itching?: No  Eyes Blurred vision?: No Double vision?: No  Ears/Nose/Throat Sore throat?: No Sinus  problems?: No  Hematologic/Lymphatic Swollen glands?: No Easy bruising?: No  Cardiovascular Leg swelling?: No Chest pain?: No  Respiratory Cough?: No Shortness of breath?: No  Endocrine Excessive thirst?: No  Musculoskeletal Back pain?: No Joint pain?: No  Neurological Headaches?: No Dizziness?: No  Psychologic Depression?: No Anxiety?: No  Physical Exam: BP (!) 163/85 (BP Location: Left Arm, Patient Position: Sitting, Cuff Size: Large)   Pulse (!) 59   Ht 6\' 2"  (1.88 m)   Wt 252 lb 8 oz (114.5 kg)   BMI 32.42 kg/m   Constitutional:  Alert and oriented, No acute distress. HEENT: [place] AT, moist mucus membranes.  Trachea midline, no masses. Cardiovascular: No clubbing, cyanosis, or edema. Respiratory: Normal respiratory effort, no increased work of breathing. GI: Abdomen is soft, nontender, nondistended, no abdominal masses GU: No CVA tenderness.  Skin: No rashes, bruises or suspicious lesions. Lymph: No cervical or inguinal adenopathy. Neurologic: Grossly intact, no focal deficits, moving all 4 extremities. Psychiatric: Normal mood and affect.  Laboratory Data: Lab Results  Component Value Date   WBC 14.5 (H) 07/15/2015   HGB 12.6 (L) 07/15/2015   HCT 36.0 (L) 07/15/2015   MCV 88.9 07/15/2015   PLT 210 07/15/2015    Lab Results  Component Value Date   CREATININE 1.10 08/02/2016    Lab Results  Component Value Date   PSA 1.21 01/28/2015   PSA 1.44 06/25/2013   PSA 1.40 06/26/2012    No results found for: TESTOSTERONE  Lab Results  Component Value Date   HGBA1C 5.2 01/28/2015    Urinalysis    Component Value Date/Time   APPEARANCEUR Clear 01/29/2016 0906   GLUCOSEU Negative 01/29/2016 0906   BILIRUBINUR Negative 01/29/2016 0906   PROTEINUR Negative 01/29/2016 0906   NITRITE Negative 01/29/2016 0906   LEUKOCYTESUR Negative 01/29/2016 0906    Pertinent Imaging: CT chest/abd/pelvis reviewed as above.  Assessment & Plan:   1. Right  RCC s/p right nephrectomy -doing well. No evidence of disease -Will continue surveillance CT chest, abdomen, and pelvis every 6 months as recommended by the AUA guidelines until April 2020 then annually after that until at least 2022.   Return in about 6 months (around 02/06/2017) for CT chest/abd/pelvis prior.  [name], MD  [facility] [address], [place], [place] [address] [phone]

## 2016-09-28 IMAGING — CT CT ABD-PEL WO/W CM
1 of 4 series · 13 of 32 positions shown, 18 images · IV contrast (omnipaque)
Comparison: 05/11/2015 unenhanced CT abdomen/ pelvis.

CLINICAL DATA: Indeterminate incidental renal mass in the right
lower kidney on recent unenhanced CT study.

EXAM:
CT ABDOMEN AND PELVIS WITHOUT AND WITH CONTRAST
TECHNIQUE: Multidetector CT imaging of the abdomen and pelvis was performed
following the standard protocol before and following the bolus
administration of intravenous contrast.
CONTRAST:  100mL OMNIPAQUE IOHEXOL 350 MG/ML SOLN

[Series 9: axial nephro · axial · 0.82mm/px · z∈[-1018,-600]mm · 13 of 161 slices shown, 18 images]
[im 11/161  soft-tissue]
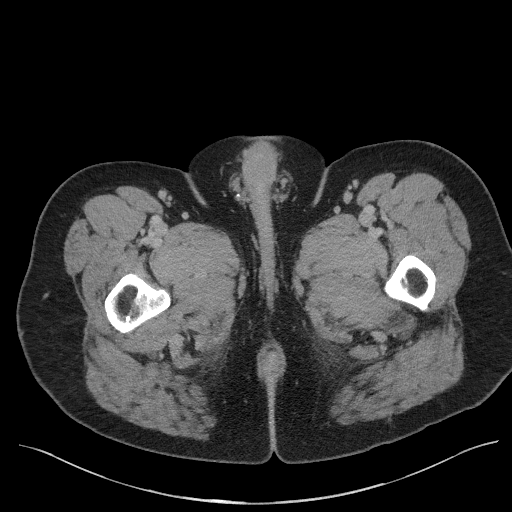
[im 11/161  bone]
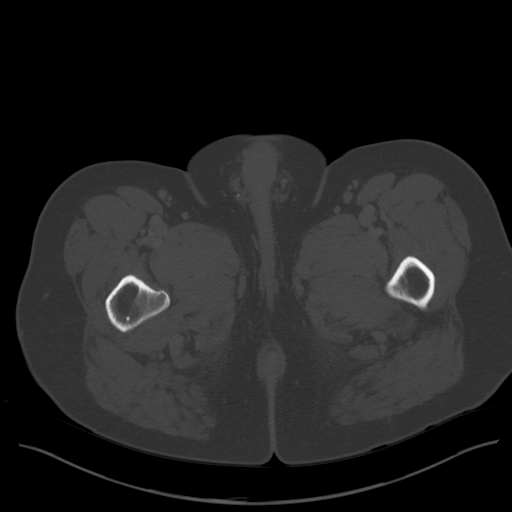
[im 22/161  soft-tissue]
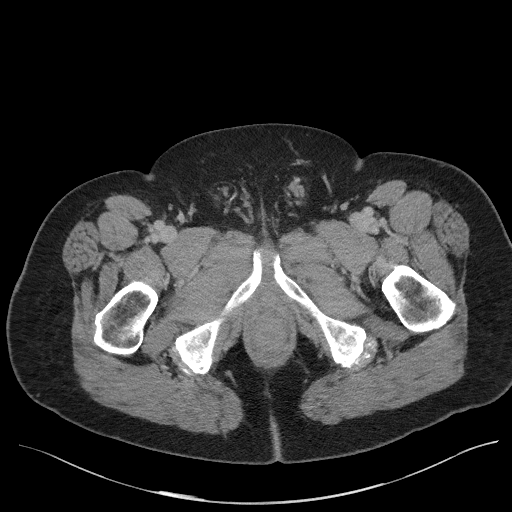
[im 33/161  soft-tissue]
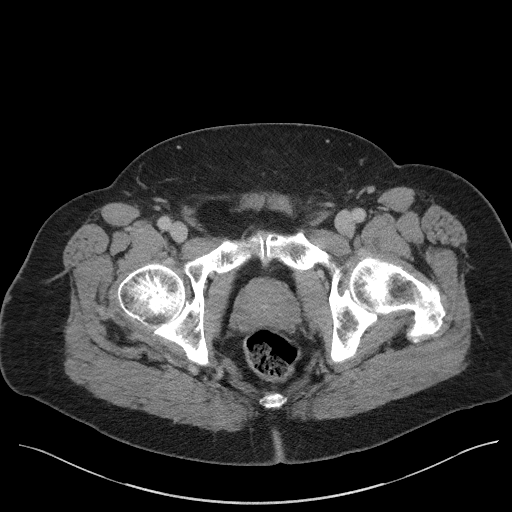
[im 54/161  soft-tissue]
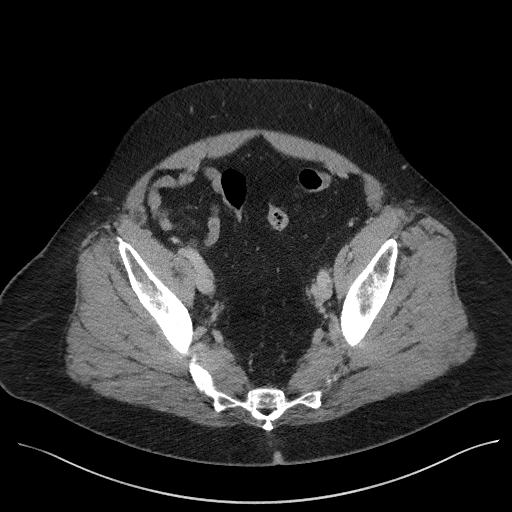
[im 65/161  soft-tissue]
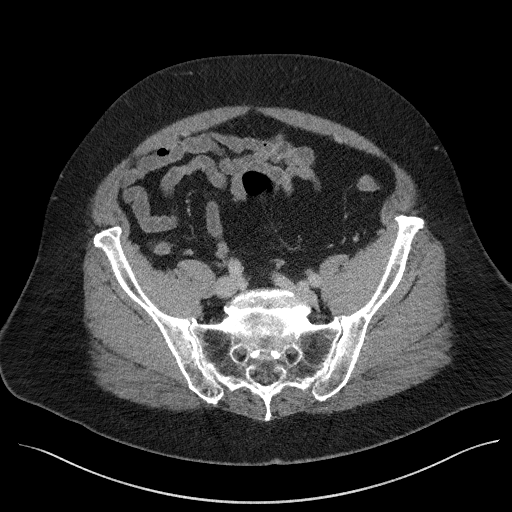
[im 75/161  soft-tissue]
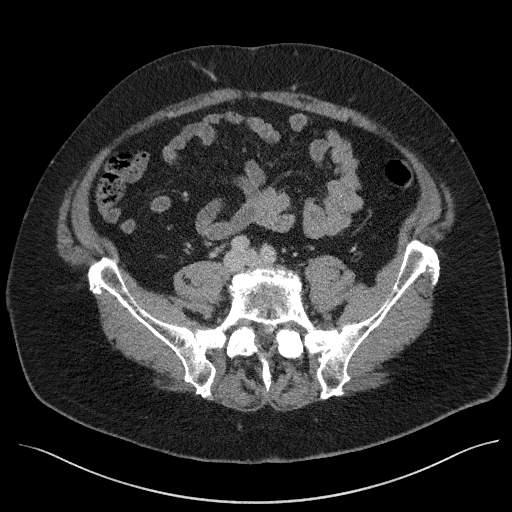
[im 86/161  soft-tissue]
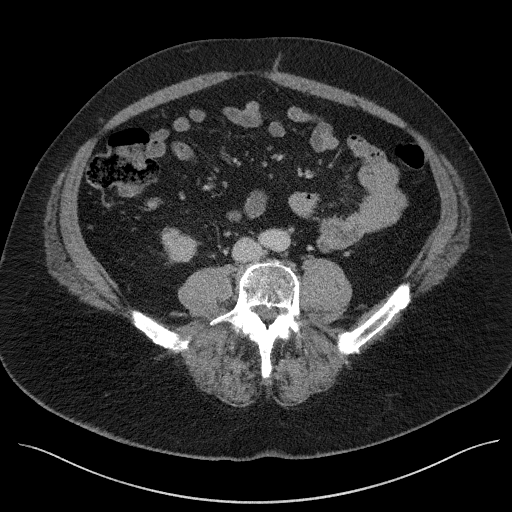
[im 97/161  soft-tissue]
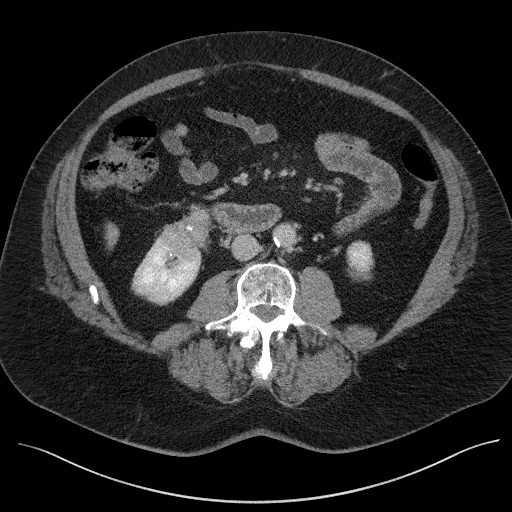
[im 107/161  soft-tissue]
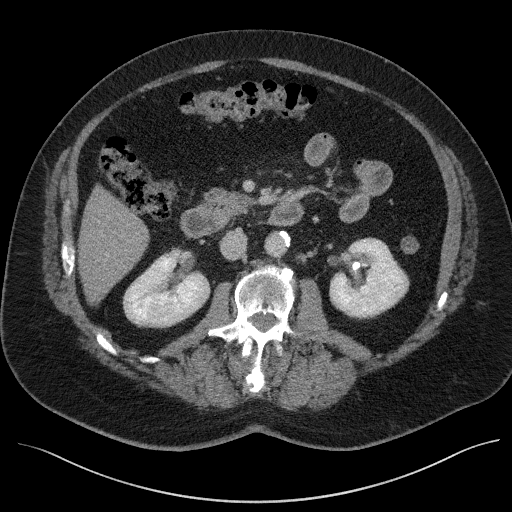
[im 107/161  bone]
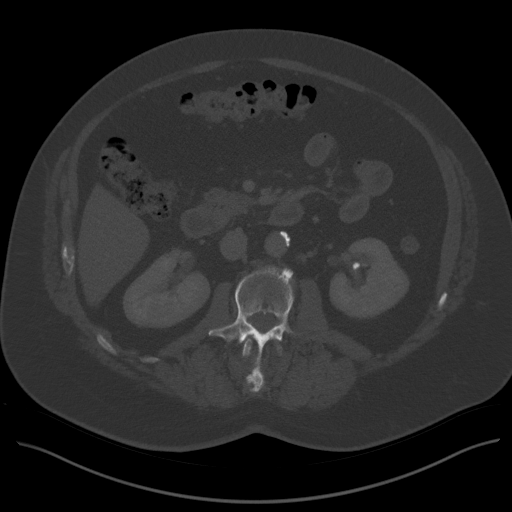
[im 118/161  lung]
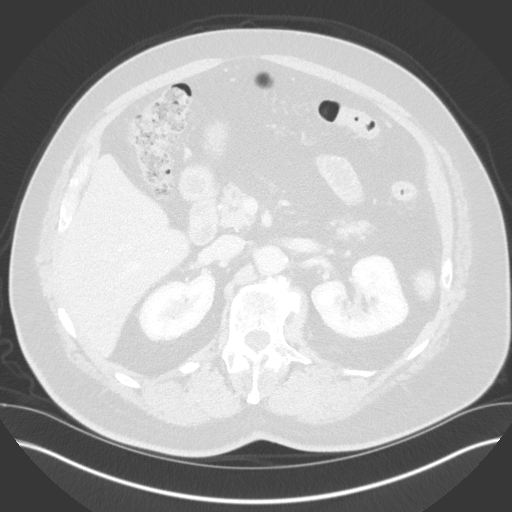
[im 129/161  soft-tissue]
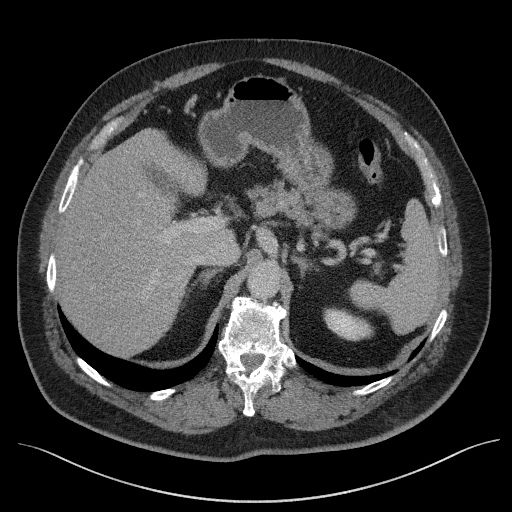
[im 129/161  lung]
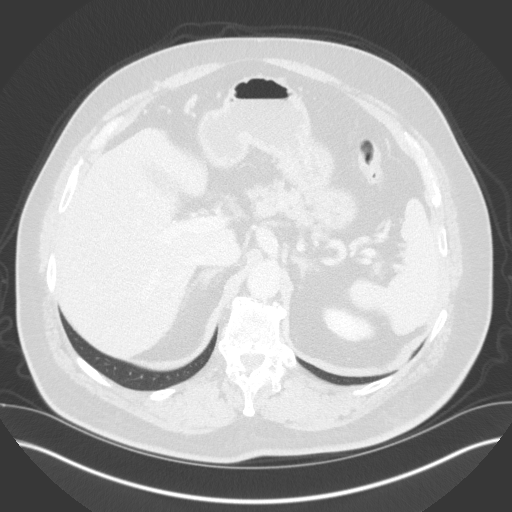
[im 139/161  soft-tissue]
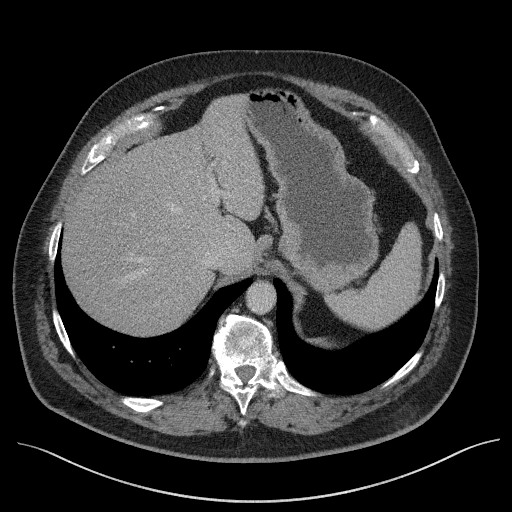
[im 139/161  lung]
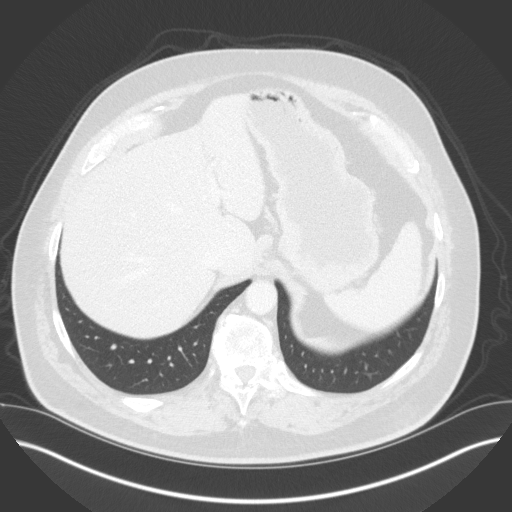
[im 150/161  soft-tissue]
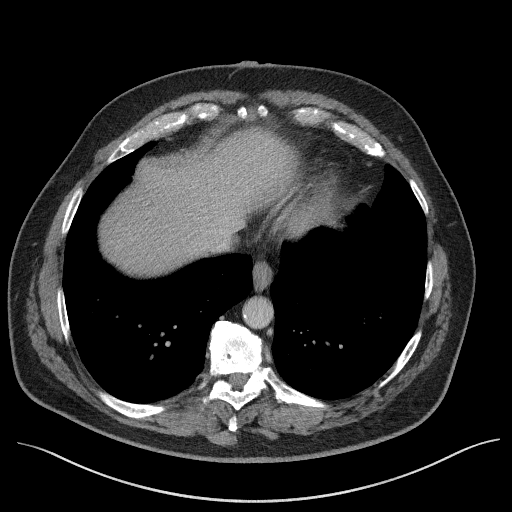
[im 150/161  lung]
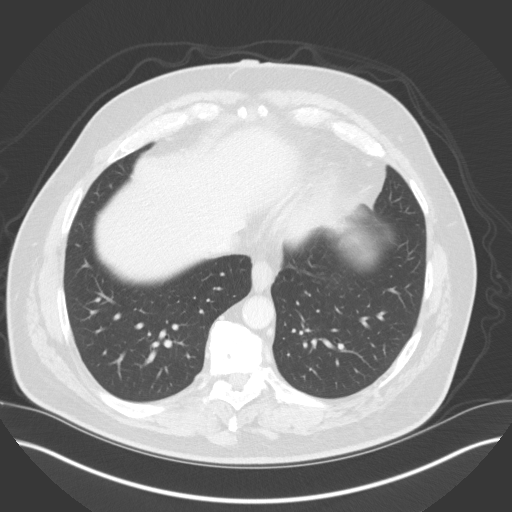

[13 of 32 positions shown; findings below may reference images not displayed]

FINDINGS: Lower chest: No significant pulmonary nodules or acute consolidative
airspace disease.

Hepatobiliary: Mild-to-moderate diffuse hepatic steatosis. No liver
mass. Normal gallbladder with no radiopaque cholelithiasis. No
biliary ductal dilatation.

Pancreas: Normal, with no mass or duct dilation.

Spleen: Normal size. No mass.

Adrenals/Urinary Tract: Normal adrenals. There is a 10 x 6 mm stone
in the left lower renal collecting system, with overlying 1.9 x
cm nonenhancing cystic structure, which could represent a dilated
calyx or caliceal diverticulum (series 5/image 60). There is a
separate 3 mm nonobstructing stone in the left lower kidney. There
is a nonobstructing 2 mm stone in the lower right kidney. No
hydronephrosis. Normal caliber ureters. There is an avidly enhancing
exophytic irregular 4.6 x 3.9 x 5.2 cm renal mass in the anterior
lower right kidney (series 9/ image 69), which demonstrates coarse
central calcifications and invasion of the anterior perinephric fat,
without definite invasion beyond Gerota's fascia. No evidence of
renal sinus invasion. Collapsed and grossly normal bladder.

Stomach/Bowel: Grossly normal stomach. Normal caliber small bowel
with no small bowel wall thickening. Normal appendix. Normal large
bowel with no diverticulosis, large bowel wall thickening or
pericolonic fat stranding.

Vascular/Lymphatic: Atherosclerotic nonaneurysmal abdominal aorta.
Patent portal, splenic, hepatic and renal veins. No pathologically
enlarged lymph nodes in the abdomen or pelvis.

Reproductive: Mild prostatomegaly.

Other: No pneumoperitoneum, ascites or focal fluid collection. Mild
mistiness of the central mesentery without adenopathy, unchanged
since 05/11/2015, nonspecific, probably representing mild mesenteric
panniculitis.

Musculoskeletal: No aggressive appearing focal osseous lesions.
Marked degenerative changes in the visualized thoracolumbar spine.
Right paramedian lower back sebaceous cyst measuring 2.2 cm.
IMPRESSION: 1. Irregular avidly enhancing exophytic 4.6 x 3.9 x 5.2 cm renal
mass in the anterior lower right kidney, most consistent with a
clear cell renal cell carcinoma. Anterior perinephric fat invasion
without definite invasion beyond Gerota's fascia. No renal sinus
invasion. Patent renal veins without tumor thrombus.
2. Dilated calyx versus calyceal diverticulum associated with a 10 x
6 mm stone in the lower left renal collecting system. Additional
tiny nonobstructing stones in both lower kidneys.
3. No evidence of metastatic disease in the abdomen or pelvis.
4. Mild-to-moderate diffuse hepatic steatosis.
5. Mild prostatomegaly.

## 2016-10-21 ENCOUNTER — Encounter: Payer: Self-pay | Admitting: Family Medicine

## 2016-10-21 ENCOUNTER — Ambulatory Visit (INDEPENDENT_AMBULATORY_CARE_PROVIDER_SITE_OTHER): Payer: BLUE CROSS/BLUE SHIELD | Admitting: Family Medicine

## 2016-10-21 VITALS — BP 140/80 | HR 54 | Temp 98.4°F | Ht 74.0 in | Wt 251.0 lb

## 2016-10-21 DIAGNOSIS — M545 Low back pain, unspecified: Secondary | ICD-10-CM

## 2016-10-21 NOTE — Progress Notes (Signed)
Dr. Frederico Hamman T. Saveon Plant, MD, [facility] [place], [address] Phone: [phone] Fax: [phone]  10/21/2016  Patient: Nicholas Thompson, MRN: [id number], DOB: 05-Dec-1955, 61 y.o.  Primary Physician:  Owens Loffler, MD   Chief Complaint  Patient presents with  . Back Pain    x several weeks   Subjective:   Nicholas Thompson is a 61 y.o. very pleasant male patient who presents with the following: Back Pain  ongoing for approximately: 3 weeks The patient has had back pain before. The back pain is localized into the lumbar spine area. They also describe no radiculopathy.  Back - R lower back and in the erector spinae and all the sudden when it hits, it will be bad and makes him stop from severe pain.   Bending over, then go away and has taken some ibuprofen and it seems to help but not going away.   Ongoing for 3 weeks.   No numbness or tingling. No bowel or bladder incontinence. No focal weakness. Prior interventions: none Physical therapy: No Chiropractic manipulations: No Acupuncture: No Osteopathic manipulation: No Heat or cold: Minimal effect  Past Medical History, Surgical History, Family History, Medications, Allergies have been reviewed and updated if relevant.  Patient Active Problem List   Diagnosis Date Noted  . Renal cell carcinoma of right kidney ([place]) 07/30/2015  . S/p RIGHT nephrectomy 07/30/2015  . COLONIC POLYPS, HX OF 08/07/2008  . HYPERLIPIDEMIA 05/18/2007  . HYPERTENSION 05/18/2007  . HYPERGLYCEMIA 05/18/2007  . CARCINOMA, BASAL CELL, HX OF 05/18/2007    Past Medical History:  Diagnosis Date  . Arthritis   . Basal cell carcinoma    bASAL CELL  . Chronic kidney disease   . GERD (gastroesophageal reflux disease)   . History of colonic polyps   . Nephrolithiasis   . Other and unspecified hyperlipidemia   . Renal cell carcinoma of right kidney ([place]) 07/30/2015  . S/p nephrectomy 07/30/2015  .  Unspecified essential hypertension     Past Surgical History:  Procedure Laterality Date  . arthroscopy of knee Right 1989  . CYSTOSCOPY WITH STENT PLACEMENT Left 06/11/2015   Procedure: CYSTOSCOPY WITH STENT PLACEMENT/ bilateral retrograde pyelogram;  Surgeon: [name], MD;  Location: [facility];  Service: Urology;  Laterality: Left;  . [place] 2001&2006   3 times  . LAPAROSCOPIC NEPHRECTOMY, HAND ASSISTED Right 07/09/2015   Procedure: HAND ASSISTED LAPAROSCOPIC NEPHRECTOMY;  Surgeon: [name], MD;  Location: [facility];  Service: Urology;  Laterality: Right;  . MOHS SURGERY Left 04/2015   Ear, [facility]  . SHOULDER ARTHROSCOPY W/ SUBACROMIAL DECOMPRESSION AND DISTAL CLAVICLE EXCISION Left 08-27-2007  . URETEROSCOPY WITH HOLMIUM LASER LITHOTRIPSY Bilateral 06/11/2015   Procedure: URETEROSCOPY WITH HOLMIUM LASER LITHOTRIPSY;  Surgeon: [name], MD;  Location: [facility];  Service: Urology;  Laterality: Bilateral;  . VASECTOMY      Social History   Social History  . Marital status: Married    Spouse name: N/A  . Number of children: 2  . Years of education: N/A   Occupational History  . sales    Social History Main Topics  . Smoking status: Never Smoker  . Smokeless tobacco: Never Used  . Alcohol use 0.0 oz/week     Comment: occ  . Drug use: No  . Sexual activity: Not on file   Other Topics Concern  . Not on file   Social  History Narrative  . No narrative on file    Family History  Problem Relation Age of Onset  . Cancer Mother        bladder  . Diabetes Father   . Hyperlipidemia Father   . Coronary artery disease Father   . Cancer Paternal Uncle        brain  . Stroke Maternal Grandfather   . Cancer Paternal Grandfather        lung  . Prostate cancer Neg Hx     Allergies  Allergen Reactions  . Vicodin [Hydrocodone-Acetaminophen] Nausea And Vomiting    Medication list reviewed and updated in full in [facility].  GEN: No fevers, chills. Nontoxic. Primarily MSK c/o today. MSK: Detailed in the HPI GI: tolerating PO intake without difficulty Neuro: As above  Otherwise the pertinent positives of the ROS are noted above.    Objective:   Blood pressure 140/80, pulse (!) 54, temperature 98.4 F (36.9 C), temperature source Oral, height 6\' 2"  (1.88 m), weight 251 lb (113.9 kg).  Gen: Well-developed,well-nourished,in no acute distress; alert,appropriate and cooperative throughout examination HEENT: Normocephalic and atraumatic without obvious abnormalities.  Ears, externally no deformities Pulm: Breathing comfortably in no respiratory distress Range of motion at  the waist: Flexion, rotation and lateral bending: full  No echymosis or edema Rises to examination table with no difficulty Gait: minimally antalgic  Inspection/Deformity: No abnormality Paraspinus T:  r l4-s1   B Ankle Dorsiflexion (L5,4): 5/5 B Great Toe Dorsiflexion (L5,4): 5/5 Heel Walk (L5): WNL Toe Walk (S1): WNL Rise/Squat (L4): WNL, mild pain  SENSORY B Medial Foot (L4): WNL B Dorsum (L5): WNL B Lateral (S1): WNL Light Touch: WNL Pinprick: WNL  REFLEXES Knee (L4): 2+ Ankle (S1): 2+  B SLR, seated: neg B SLR, supine: neg B FABER: neg B Reverse FABER: neg B Greater Troch: NT B Log Roll: neg B Stork: NT B Sciatic Notch: NT  Radiology: No results found.  Assessment and Plan:   Acute right-sided low back pain without sciatica  Start with medications, core rehab, and progress from there following low back pain algorithm. No red flags are present.  Follow-up: No Follow-up on file.  Signed,  Maud Deed. Marcus Schwandt, MD   Allergies as of 10/21/2016      Reactions   Vicodin [hydrocodone-acetaminophen] Nausea And Vomiting      Medication List       Accurate as of 10/21/16  1:58 PM. Always use your most recent med list.          enalapril 20 MG tablet Commonly known as:  VASOTEC TAKE TWO TABLETS BY  MOUTH ONCE DAILY   omeprazole 20 MG capsule Commonly known as:  PRILOSEC Take 1 capsule (20 mg total) by mouth daily.

## 2017-02-09 ENCOUNTER — Ambulatory Visit: Payer: BLUE CROSS/BLUE SHIELD

## 2017-02-24 ENCOUNTER — Ambulatory Visit
Admission: RE | Admit: 2017-02-24 | Discharge: 2017-02-24 | Disposition: A | Discharge status: Home / Self Care | Payer: BLUE CROSS/BLUE SHIELD | Source: Ambulatory Visit | Attending: Urology | Admitting: Urology

## 2017-02-24 DIAGNOSIS — Z85528 Personal history of other malignant neoplasm of kidney: Secondary | ICD-10-CM | POA: Diagnosis not present

## 2017-02-24 DIAGNOSIS — N200 Calculus of kidney: Secondary | ICD-10-CM | POA: Insufficient documentation

## 2017-02-24 DIAGNOSIS — Z905 Acquired absence of kidney: Secondary | ICD-10-CM | POA: Insufficient documentation

## 2017-02-24 DIAGNOSIS — C641 Malignant neoplasm of right kidney, except renal pelvis: Secondary | ICD-10-CM | POA: Diagnosis not present

## 2017-02-24 DIAGNOSIS — K439 Ventral hernia without obstruction or gangrene: Secondary | ICD-10-CM | POA: Diagnosis not present

## 2017-02-24 LAB — POCT I-STAT CREATININE: Creatinine, Ser: 1 mg/dL (ref 0.61–1.24)

## 2017-02-24 MED ORDER — IOPAMIDOL (ISOVUE-300) INJECTION 61%
100.0000 mL | Freq: Once | INTRAVENOUS | Status: AC | PRN
  Administered 2017-02-24: 100 mL via INTRAVENOUS

## 2017-02-25 ENCOUNTER — Ambulatory Visit (INDEPENDENT_AMBULATORY_CARE_PROVIDER_SITE_OTHER): Payer: BLUE CROSS/BLUE SHIELD | Admitting: Urology

## 2017-02-25 ENCOUNTER — Encounter: Payer: Self-pay | Admitting: Urology

## 2017-02-25 VITALS — BP 167/78 | HR 66 | Ht 74.0 in | Wt 241.9 lb

## 2017-02-25 DIAGNOSIS — C641 Malignant neoplasm of right kidney, except renal pelvis: Secondary | ICD-10-CM | POA: Diagnosis not present

## 2017-02-25 NOTE — Progress Notes (Signed)
02/25/2017 4:03 PM   Nicholas Thompson March 02, 1956 [id number]  Referring provider: Owens Loffler, MD [place], [place] [address]  No chief complaint on file.   HPI: The patient is a 61 year old gentleman status post right nephrectomy for pT3a clear cell RCC with negative margins. Histumor wass 4.6 cm in size but there was extension into perinephric tissue (April 2017). He is doing well at this point. He has no complaints at this time. He has no unexpected weight loss or new pain. CT of his chest, abdomen, and pelvis show no evidence of disease in December 2018.  Creatinine in December 2018 was 1.0 which is stable.   CT does however show a fat-containing hernia in the right lower quadrant.   PMH: Past Medical History:  Diagnosis Date  . Arthritis   . Basal cell carcinoma    bASAL CELL  . Chronic kidney disease   . GERD (gastroesophageal reflux disease)   . History of colonic polyps   . Nephrolithiasis   . Other and unspecified hyperlipidemia   . Renal cell carcinoma of right kidney ([place]) 07/30/2015  . S/p nephrectomy 07/30/2015  . Unspecified essential hypertension     Surgical History: Past Surgical History:  Procedure Laterality Date  . arthroscopy of knee Right 1989  . CYSTOSCOPY WITH STENT PLACEMENT Left 06/11/2015   Procedure: CYSTOSCOPY WITH STENT PLACEMENT/ bilateral retrograde pyelogram;  Surgeon: [name], MD;  Location: [facility];  Service: Urology;  Laterality: Left;  . [place] 2001&2006   3 times  . LAPAROSCOPIC NEPHRECTOMY, HAND ASSISTED Right 07/09/2015   Procedure: HAND ASSISTED LAPAROSCOPIC NEPHRECTOMY;  Surgeon: [name], MD;  Location: [facility];  Service: Urology;  Laterality: Right;  . MOHS SURGERY Left 04/2015   Ear, [facility]  . SHOULDER ARTHROSCOPY W/ SUBACROMIAL DECOMPRESSION AND DISTAL CLAVICLE EXCISION Left 08-27-2007  . URETEROSCOPY WITH HOLMIUM LASER LITHOTRIPSY Bilateral 06/11/2015   Procedure:  URETEROSCOPY WITH HOLMIUM LASER LITHOTRIPSY;  Surgeon: [name], MD;  Location: [facility];  Service: Urology;  Laterality: Bilateral;  . VASECTOMY      Home Medications:  Allergies as of 02/25/2017      Reactions   Vicodin [hydrocodone-acetaminophen] Nausea And Vomiting      Medication List        Accurate as of 02/25/17  4:03 PM. Always use your most recent med list.          enalapril 20 MG tablet Commonly known as:  VASOTEC TAKE TWO TABLETS BY MOUTH ONCE DAILY   omeprazole 20 MG capsule Commonly known as:  PRILOSEC Take 1 capsule (20 mg total) by mouth daily.       Allergies:  Allergies  Allergen Reactions  . Vicodin [Hydrocodone-Acetaminophen] Nausea And Vomiting    Family History: Family History  Problem Relation Age of Onset  . Cancer Mother        bladder  . Diabetes Father   . Hyperlipidemia Father   . Coronary artery disease Father   . Cancer Paternal Uncle        brain  . Stroke Maternal Grandfather   . Cancer Paternal Grandfather        lung  . Prostate cancer Neg Hx     Social History:  reports that  has never smoked. he has never used smokeless tobacco. He reports that he drinks alcohol. He reports that he does not use drugs.  ROS: UROLOGY Frequent Urination?: No Hard to postpone urination?:  No Burning/pain with urination?: No Get up at night to urinate?: Yes Leakage of urine?: No Urine stream starts and stops?: No Trouble starting stream?: No Do you have to strain to urinate?: No Blood in urine?: No Urinary tract infection?: No Sexually transmitted disease?: No Injury to kidneys or bladder?: No Painful intercourse?: No Weak stream?: No Erection problems?: No Penile pain?: No  Gastrointestinal Nausea?: No Vomiting?: No Indigestion/heartburn?: No Diarrhea?: No Constipation?: No  Constitutional Fever: No Night sweats?: No Weight loss?: No Fatigue?: No  Skin Skin rash/lesions?: No Itching?: No  Eyes Blurred  vision?: No Double vision?: No  Ears/Nose/Throat Sore throat?: No Sinus problems?: No  Hematologic/Lymphatic Swollen glands?: No Easy bruising?: No  Cardiovascular Leg swelling?: No Chest pain?: No  Respiratory Cough?: No Shortness of breath?: No  Endocrine Excessive thirst?: No  Musculoskeletal Back pain?: No Joint pain?: Yes  Neurological Headaches?: No Dizziness?: No  Psychologic Depression?: No Anxiety?: No  Physical Exam: BP (!) 167/78 (BP Location: Right Arm, Patient Position: Sitting, Cuff Size: Large)   Pulse 66   Ht 6\' 2"  (1.88 m)   Wt 241 lb 14.4 oz (109.7 kg)   BMI 31.06 kg/m   Constitutional:  Alert and oriented, No acute distress. HEENT: [place] AT, moist mucus membranes.  Trachea midline, no masses. Cardiovascular: No clubbing, cyanosis, or edema. Respiratory: Normal respiratory effort, no increased work of breathing. GI: Abdomen is soft, nontender, nondistended, no abdominal masses GU: No CVA tenderness.  He does have a reducible fat-containing hernia at his right lower quadrant hand port incision. Skin: No rashes, bruises or suspicious lesions. Lymph: No cervical or inguinal adenopathy. Neurologic: Grossly intact, no focal deficits, moving all 4 extremities. Psychiatric: Normal mood and affect.  Laboratory Data: Lab Results  Component Value Date   WBC 14.5 (H) 07/15/2015   HGB 12.6 (L) 07/15/2015   HCT 36.0 (L) 07/15/2015   MCV 88.9 07/15/2015   PLT 210 07/15/2015    Lab Results  Component Value Date   CREATININE 1.00 02/24/2017    Lab Results  Component Value Date   PSA 1.21 01/28/2015   PSA 1.44 06/25/2013   PSA 1.40 06/26/2012    No results found for: TESTOSTERONE  Lab Results  Component Value Date   HGBA1C 5.2 01/28/2015    Urinalysis    Component Value Date/Time   APPEARANCEUR Clear 01/29/2016 0906   GLUCOSEU Negative 01/29/2016 0906   BILIRUBINUR Negative 01/29/2016 0906   PROTEINUR Negative 01/29/2016 0906    NITRITE Negative 01/29/2016 0906   LEUKOCYTESUR Negative 01/29/2016 8341    Pertinent Imaging: CT chest abdomen pelvis reviewed with no evidence of malignancy or metastasis.  Assessment & Plan:    1. Right RCC s/p right nephrectomy - No evidence of disease -Will continue surveillance CT chest, abdomen, and pelvis every 6 months as recommended by the AUA guidelines until April 2020 then annually after that until at least 2022.  2.  Right lower quadrant incisional hernia Discussed the patient that he does have a hernia here.  He is not bothered by it.  He was given warning signs for bowel entrapment and the need for emergent trip to the emergency room.  At this point, he is not interested in having it repaired as it is a symptom  Return in about 6 months (around 08/26/2017) for w/ CT chest/abd/pelvis prior.  [name], MD  [facility] [address] [place], [place] [address] [phone]

## 2017-04-12 ENCOUNTER — Other Ambulatory Visit: Payer: Self-pay | Admitting: Family Medicine

## 2017-04-13 NOTE — Telephone Encounter (Signed)
Last office visit 08.02.18 for Back Pain.  Last CPE 01/30/2015.  Ok to refill?

## 2017-05-18 ENCOUNTER — Other Ambulatory Visit: Payer: Self-pay

## 2017-05-18 ENCOUNTER — Ambulatory Visit: Payer: BLUE CROSS/BLUE SHIELD | Admitting: Family Medicine

## 2017-05-18 ENCOUNTER — Encounter: Payer: Self-pay | Admitting: Family Medicine

## 2017-05-18 VITALS — BP 150/80 | HR 55 | Temp 97.9°F | Ht 74.0 in | Wt 252.5 lb

## 2017-05-18 DIAGNOSIS — M25552 Pain in left hip: Secondary | ICD-10-CM | POA: Insufficient documentation

## 2017-05-18 DIAGNOSIS — Z23 Encounter for immunization: Secondary | ICD-10-CM | POA: Diagnosis not present

## 2017-05-18 MED ORDER — DICLOFENAC SODIUM 75 MG PO TBEC: 75.0000 mg | DELAYED_RELEASE_TABLET | Freq: Two times a day (BID) | ORAL | 0 refills | Status: DC

## 2017-05-18 NOTE — Progress Notes (Signed)
   Subjective:    Patient ID: Nicholas Thompson, male    DOB: 08-Feb-1956, 62 y.o.   MRN: [id number]  HPI    62 year old male pt present for new left hip pain in last year, worsening and much more painful in last 24 hours. Pain is in lateral hip, no buttock pain, no anterior pain.  Pain greatest when walking. No radiation of pain  no low back pain.  No recent injury or change in activity.  Using  600 mg ibuprofen as needed every 4 hours.  Helped a little. Has not tried any else.  left leg is shorter than right.   No numbness, no weakness.  No fever Blood pressure (!) 150/80, pulse (!) 55, temperature 97.9 F (36.6 C), temperature source Oral, height 6\' 2"  (1.88 m), weight 252 lb 8 oz (114.5 kg).  Review of Systems  Constitutional: Negative for fatigue and fever.  HENT: Negative for ear pain.   Eyes: Negative for pain.  Respiratory: Negative for cough and shortness of breath.   Cardiovascular: Negative for chest pain, palpitations and leg swelling.  Gastrointestinal: Negative for abdominal pain.  Genitourinary: Negative for dysuria.  Musculoskeletal: Negative for arthralgias.  Neurological: Negative for syncope, light-headedness and headaches.  Psychiatric/Behavioral: Negative for dysphoric mood.       Objective:   Physical Exam  Constitutional: Vital signs are normal. He appears well-developed and well-nourished.  HENT:  Head: Normocephalic.  Right Ear: Hearing normal.  Left Ear: Hearing normal.  Nose: Nose normal.  Mouth/Throat: Oropharynx is clear and moist and mucous membranes are normal.  Neck: Trachea normal. Carotid bruit is not present. No thyroid mass and no thyromegaly present.  Cardiovascular: Normal rate, regular rhythm and normal pulses. Exam reveals no gallop, no distant heart sounds and no friction rub.  No murmur heard. No peripheral edema  Pulmonary/Chest: Effort normal and breath sounds normal. No respiratory distress.  Musculoskeletal:       Right hip: He  exhibits decreased range of motion. He exhibits normal strength, no tenderness and no bony tenderness.       Left hip: He exhibits decreased range of motion and tenderness.       Legs: ttp over lateral hip, slightly higher than troch bursa  Skin: Skin is warm, dry and intact. No rash noted.  Psychiatric: He has a normal mood and affect. His speech is normal and behavior is normal. Thought content normal.          Assessment & Plan:

## 2017-05-18 NOTE — Patient Instructions (Addendum)
Stop ibuprofen. Start diclofenac twice daily for pain and inflammation.  Start ice on laterral hip.  Start home PT execises.  If not improving in 1-2 weeks.. Make a follow up with Dr. Lorelei Pont.

## 2017-05-18 NOTE — Assessment & Plan Note (Signed)
Most consistent with bursitis, less likely arthritis.  No indication for X-ray.  Will treat with NSAIDS  ( short term given only one kidney)  If not improving follow up with PCP for possible steroid injection.

## 2017-05-18 NOTE — Addendum Note (Signed)
Addended by: Eliezer Lofts E on: 05/18/2017 12:52 PM   Modules accepted: Orders

## 2017-06-20 ENCOUNTER — Other Ambulatory Visit: Payer: Self-pay | Admitting: Family Medicine

## 2017-06-20 NOTE — Telephone Encounter (Signed)
Left VM re: refill request. Reassess pain level. Pt was to f/u with appt if pain unresolved.

## 2017-06-20 NOTE — Telephone Encounter (Signed)
Copied from [place] [id number]. Topic: Quick Communication - Rx Refill/Question >> Jun 20, 2017 11:23 AM Synthia Innocent wrote: Medication: diclofenac (VOLTAREN) 75 MG EC tablet  Has the patient contacted their pharmacy? Yes.   (Agent: If no, request that the patient contact the pharmacy for the refill.) Preferred Pharmacy (with phone number or street name): [facility] in [place]: Please be advised that RX refills may take up to 3 business days. We ask that you follow-up with your pharmacy.

## 2017-06-20 NOTE — Telephone Encounter (Signed)
Requesting refill voltaren to [facility] [place] Last refilled # 30 on 05/18/17 Last seen 05/18/17.Please advise.

## 2017-06-20 NOTE — Telephone Encounter (Signed)
Pt is called back and stated that he was rx Voltaren and it was a 14 day supply. He stated that it helped his pain.  After he stopped the Voltaren he stated that the pain came back and is now 6-8  On scale of 1-10. Pain is located to his left hip. Pt states that he is willing to make appt but was hoping he could have either a 2 week or 1 month refill on the Voltaren. He stated it would be hard to come in due to work being short staffed. Voltaren  refill Last OV: 05/18/17 Last Refill:n/a Pharmacy:[facility] in [place] PCP: Dr. Lorelei Pont

## 2017-06-21 MED ORDER — DICLOFENAC SODIUM 75 MG PO TBEC: 75.0000 mg | DELAYED_RELEASE_TABLET | Freq: Two times a day (BID) | ORAL | 0 refills | Status: DC

## 2017-07-11 ENCOUNTER — Ambulatory Visit: Payer: BLUE CROSS/BLUE SHIELD | Admitting: Family Medicine

## 2017-07-11 ENCOUNTER — Other Ambulatory Visit: Payer: Self-pay

## 2017-07-11 ENCOUNTER — Encounter: Payer: Self-pay | Admitting: Family Medicine

## 2017-07-11 VITALS — BP 160/80 | HR 68 | Temp 98.3°F | Ht 74.0 in | Wt 250.5 lb

## 2017-07-11 DIAGNOSIS — M7062 Trochanteric bursitis, left hip: Secondary | ICD-10-CM

## 2017-07-11 DIAGNOSIS — M76892 Other specified enthesopathies of left lower limb, excluding foot: Secondary | ICD-10-CM

## 2017-07-11 MED ORDER — PREDNISONE 20 MG PO TABS: ORAL_TABLET | ORAL | 0 refills | Status: DC

## 2017-07-11 NOTE — Patient Instructions (Signed)
Hip Rehab:  Hip Flexion: Toe up to ceiling, laying on your back. Lift your whole leg, 3 sets. Work up to being able to do #30 with each set.  Hip elevations, Toe and leg turned out to side.  Lift whole leg, 3 sets. Work up to being able to do #30 with each set.  Hip Abductions: Lying on side, straight out to side. 3 sets, work up to being able to do #30 with each set.  At the beginning you may only be able to do a lot less, try to do #10.  

## 2017-07-11 NOTE — Progress Notes (Signed)
Dr. Frederico Hamman T. Mattisen Pohlmann, MD, [facility] Primary [facility] and Sports Medicine [place], [address] Phone: [phone] Fax: [phone]  07/11/2017  Patient: Nicholas Thompson, MRN: [id number], DOB: [birth year], 62 y.o.  Primary Physician:  Nicholas Loffler, MD   Chief Complaint  Patient presents with  . Hip Pain    Left   Subjective:   Nicholas Thompson is a 62 y.o. very pleasant male patient who presents with the following:  Left anterior hip pain, hip flexor:  Well known patient seen approx 1 month ago by my partner Dr. Diona Thompson who thought he had some gtb. Voltaren did help a lot, but he now has some significant anterior hip pain.   History is significant for recent bike trip in [place].   Past Medical History, Surgical History, Social History, Family History, Problem List, Medications, and Allergies have been reviewed and updated if relevant.  Patient Active Problem List   Diagnosis Date Noted  . Left hip pain 05/18/2017  . Renal cell carcinoma of right kidney ([place]) 07/30/2015  . S/p RIGHT nephrectomy 07/30/2015  . COLONIC POLYPS, HX OF 08/07/2008  . HYPERLIPIDEMIA 05/18/2007  . HYPERTENSION 05/18/2007  . HYPERGLYCEMIA 05/18/2007  . CARCINOMA, BASAL CELL, HX OF 05/18/2007    Past Medical History:  Diagnosis Date  . Arthritis   . Basal cell carcinoma    bASAL CELL  . Chronic kidney disease   . GERD (gastroesophageal reflux disease)   . History of colonic polyps   . Nephrolithiasis   . Other and unspecified hyperlipidemia   . Renal cell carcinoma of right kidney ([place]) 07/30/2015  . S/p nephrectomy 07/30/2015  . Unspecified essential hypertension     Past Surgical History:  Procedure Laterality Date  . arthroscopy of knee Right 1989  . CYSTOSCOPY WITH STENT PLACEMENT Left 06/11/2015   Procedure: CYSTOSCOPY WITH STENT PLACEMENT/ bilateral retrograde pyelogram;  Surgeon: Nicholas Retort, MD;  Location: [facility] ORS;  Service: Urology;  Laterality: Left;    . [place] 2001&2006   3 times  . LAPAROSCOPIC NEPHRECTOMY, HAND ASSISTED Right 07/09/2015   Procedure: HAND ASSISTED LAPAROSCOPIC NEPHRECTOMY;  Surgeon: Nicholas Retort, MD;  Location: [facility] ORS;  Service: Urology;  Laterality: Right;  . MOHS SURGERY Left 04/2015   Ear, [facility]  . SHOULDER ARTHROSCOPY W/ SUBACROMIAL DECOMPRESSION AND DISTAL CLAVICLE EXCISION Left 08-27-2007  . URETEROSCOPY WITH HOLMIUM LASER LITHOTRIPSY Bilateral 06/11/2015   Procedure: URETEROSCOPY WITH HOLMIUM LASER LITHOTRIPSY;  Surgeon: Nicholas Retort, MD;  Location: [facility] ORS;  Service: Urology;  Laterality: Bilateral;  . VASECTOMY      Social History   Socioeconomic History  . Marital status: Married    Spouse name: Not on file  . Number of children: 2  . Years of education: Not on file  . Highest education level: Not on file  Occupational History  . Occupation: Geographical information systems officer  . Financial resource strain: Not on file  . Food insecurity:    Worry: Not on file    Inability: Not on file  . Transportation needs:    Medical: Not on file    Non-medical: Not on file  Tobacco Use  . Smoking status: Never Smoker  . Smokeless tobacco: Never Used  Substance and Sexual Activity  . Alcohol use: Yes    Alcohol/week: 0.0 oz    Comment: occ  . Drug use: No  . Sexual activity: Not on file  Lifestyle  . Physical  activity:    Days per week: Not on file    Minutes per session: Not on file  . Stress: Not on file  Relationships  . Social connections:    Talks on phone: Not on file    Gets together: Not on file    Attends religious service: Not on file    Active member of club or organization: Not on file    Attends meetings of clubs or organizations: Not on file    Relationship status: Not on file  . Intimate partner violence:    Fear of current or ex partner: Not on file    Emotionally abused: Not on file    Physically abused: Not on file    Forced sexual activity: Not on file  Other  Topics Concern  . Not on file  Social History Narrative  . Not on file    Family History  Problem Relation Age of Onset  . Cancer Mother        bladder  . Diabetes Father   . Hyperlipidemia Father   . Coronary artery disease Father   . Cancer Paternal Uncle        brain  . Stroke Maternal Grandfather   . Cancer Paternal Grandfather        lung  . Prostate cancer Neg Hx     Allergies  Allergen Reactions  . Vicodin [Hydrocodone-Acetaminophen] Nausea And Vomiting    Medication list reviewed and updated in full in [place].  GEN: No fevers, chills. Nontoxic. Primarily MSK c/o today. MSK: Detailed in the HPI GI: tolerating PO intake without difficulty Neuro: No numbness, parasthesias, or tingling associated. Otherwise the pertinent positives of the ROS are noted above.   Objective:   BP (!) 160/80   Pulse 68   Temp 98.3 F (36.8 C) (Oral)   Ht 6\' 2"  (1.88 m)   Wt 250 lb 8 oz (113.6 kg)   BMI 32.16 kg/m    GEN: WDWN, NAD, Non-toxic, Alert & Oriented x 3 HEENT: Atraumatic, Normocephalic.  Ears and Nose: No external deformity. EXTR: No clubbing/cyanosis/edema NEURO: Normal gait.  PSYCH: Normally interactive. Conversant. Not depressed or anxious appearing.  Calm demeanor.   HIP EXAM: SIDE: L ROM: Abduction, Flexion, Internal and External range of motion: full Pain with terminal IROM and EROM: minimal GTB: minimal SLR: NEG Knees: No effusion FABER: NT REVERSE FABER: NT, neg Piriformis: NT at direct palpation Str: flexion: 3-/5 abduction: 5/5 adduction: 4/5 Strength testing tender   Radiology: 02/25/2017 CT of abdomen and pelvis pulled up by myself. Joint spaces overall preserved with mild OA changes, more inferiorly.  Electronically Signed  By: Nicholas Loffler, MD On: 07/12/2017 10:06 AM   Assessment and Plan:   Hip flexor tendinitis, left  Trochanteric bursitis of left hip  Fairly dramatic tenosynovitis and hip flexor pain. Rec PT, but he  declined.  Reviewed basic rehab and will try pulse steroids.   Follow-up: 6 weeks if needed  Meds ordered this encounter  Medications  . predniSONE (DELTASONE) 20 MG tablet    Sig: 2 tabs po for 5 days, the 1 tab po for 5 days    Dispense:  15 tablet    Refill:  0   Signed,  Nicholas Thompson Rabel, MD   Allergies as of 07/11/2017      Reactions   Vicodin [hydrocodone-acetaminophen] Nausea And Vomiting      Medication List        Accurate as of 07/11/17 11:59  PM. Always use your most recent med list.          diclofenac 75 MG EC tablet Commonly known as:  VOLTAREN Take 1 tablet (75 mg total) by mouth 2 (two) times daily.   enalapril 20 MG tablet Commonly known as:  VASOTEC TAKE TWO TABLETS BY MOUTH ONCE DAILY   omeprazole 20 MG capsule Commonly known as:  PRILOSEC Take 1 capsule (20 mg total) by mouth daily.   predniSONE 20 MG tablet Commonly known as:  DELTASONE 2 tabs po for 5 days, the 1 tab po for 5 days

## 2017-07-12 ENCOUNTER — Encounter: Payer: Self-pay | Admitting: Family Medicine

## 2017-07-13 ENCOUNTER — Ambulatory Visit: Payer: BLUE CROSS/BLUE SHIELD | Admitting: Family Medicine

## 2017-07-18 ENCOUNTER — Other Ambulatory Visit: Payer: Self-pay | Admitting: Family Medicine

## 2017-07-18 NOTE — Telephone Encounter (Signed)
Last office visit 07/11/17.  Last refilled 07/11/2017 for #15 with no refills.  Refill?

## 2017-07-18 NOTE — Telephone Encounter (Signed)
He should not be out yet? He should have a few more days?

## 2017-07-21 ENCOUNTER — Encounter: Payer: Self-pay | Admitting: Family Medicine

## 2017-07-26 NOTE — Progress Notes (Signed)
Dr. Frederico Hamman T. Darnesha Diloreto, MD, [facility] Primary [facility] and Sports Medicine [place], [address] Phone: [phone] Fax: [phone]  07/27/2017  Patient: Nicholas Thompson, MRN: [id number], DOB: April 11, 1955, 62 y.o.  Primary Physician:  Owens Loffler, MD   Chief Complaint  Patient presents with  . Hip Pain    Left   Subjective:   Nicholas Thompson is a 62 y.o. very pleasant male patient who presents with the following:  Pleasant patient of mine who is well-known who presents after an approximate 6-week history of left-sided hip pain that is been persistent, and he has been having some severe pain.  He is communicated with me several times through the encrypted email system, and he was highly concerned, so recommended that he come in to be rechecked and reevaluated.  The patient presents and he has what he describes as severe, unrelenting left-sided hip pain.  He has missed several days of work in the last 10 days, and he is also been missing a significant amount of sleep.  He is having pain deep in the hip both anteriorly and posteriorly.  He is not having any back pain or any radiculopathy.  History is also significant for a prior renal cell carcinoma.  07/18/2017 Last OV with Owens Loffler, MD  Left anterior hip pain, hip flexor:  Well known patient seen approx 1 month ago by my partner Dr. [name] who thought he had some gtb. Voltaren did help a lot, but he now has some significant anterior hip pain.   History is significant for recent bike trip in [place].   Past Medical History, Surgical History, Social History, Family History, Problem List, Medications, and Allergies have been reviewed and updated if relevant.  Patient Active Problem List   Diagnosis Date Noted  . Left hip pain 05/18/2017  . Renal cell carcinoma of right kidney ([place]) 07/30/2015  . S/p RIGHT nephrectomy 07/30/2015  . COLONIC POLYPS, HX OF 08/07/2008  . HYPERLIPIDEMIA 05/18/2007  .  HYPERTENSION 05/18/2007  . HYPERGLYCEMIA 05/18/2007  . CARCINOMA, BASAL CELL, HX OF 05/18/2007    Past Medical History:  Diagnosis Date  . Arthritis   . Basal cell carcinoma    bASAL CELL  . Chronic kidney disease   . GERD (gastroesophageal reflux disease)   . History of colonic polyps   . Nephrolithiasis   . Other and unspecified hyperlipidemia   . Renal cell carcinoma of right kidney ([place]) 07/30/2015  . S/p nephrectomy 07/30/2015  . Unspecified essential hypertension     Past Surgical History:  Procedure Laterality Date  . arthroscopy of knee Right 1989  . CYSTOSCOPY WITH STENT PLACEMENT Left 06/11/2015   Procedure: CYSTOSCOPY WITH STENT PLACEMENT/ bilateral retrograde pyelogram;  Surgeon: [name], MD;  Location: [facility] ORS;  Service: Urology;  Laterality: Left;  . [place] 2001&2006   3 times  . LAPAROSCOPIC NEPHRECTOMY, HAND ASSISTED Right 07/09/2015   Procedure: HAND ASSISTED LAPAROSCOPIC NEPHRECTOMY;  Surgeon: [name], MD;  Location: [facility] ORS;  Service: Urology;  Laterality: Right;  . MOHS SURGERY Left 04/2015   [facility]ar, [facility]  . SHOULDER ARTHROSCOPY W/ SUBACROMIAL DECOMPRESSION AND DISTAL CLAVICLE EXCISION Left 08-27-2007  . URETEROSCOPY WITH HOLMIUM LASER LITHOTRIPSY Bilateral 06/11/2015   Procedure: URETEROSCOPY WITH HOLMIUM LASER LITHOTRIPSY;  Surgeon: [name], MD;  Location: [facility] ORS;  Service: Urology;  Laterality: Bilateral;  . VASECTOMY      Social History   Socioeconomic History  .  Marital status: Married    Spouse name: Not on file  . Number of children: 2  . Years of education: Not on file  . Highest education level: Not on file  Occupational History  . Occupation: Geographical information systems officer  . Financial resource strain: Not on file  . Food insecurity:    Worry: Not on file    Inability: Not on file  . Transportation needs:    Medical: Not on file    Non-medical: Not on file  Tobacco Use  . Smoking status: Never  Smoker  . Smokeless tobacco: Never Used  Substance and Sexual Activity  . Alcohol use: Yes    Alcohol/week: 0.0 oz    Comment: occ  . Drug use: No  . Sexual activity: Not on file  Lifestyle  . Physical activity:    Days per week: Not on file    Minutes per session: Not on file  . Stress: Not on file  Relationships  . Social connections:    Talks on phone: Not on file    Gets together: Not on file    Attends religious service: Not on file    Active member of club or organization: Not on file    Attends meetings of clubs or organizations: Not on file    Relationship status: Not on file  . Intimate partner violence:    Fear of current or ex partner: Not on file    Emotionally abused: Not on file    Physically abused: Not on file    Forced sexual activity: Not on file  Other Topics Concern  . Not on file  Social History Narrative  . Not on file    Family History  Problem Relation Age of Onset  . Cancer Mother        bladder  . Diabetes Father   . Hyperlipidemia Father   . Coronary artery disease Father   . Cancer Paternal Uncle        brain  . Stroke Maternal Grandfather   . Cancer Paternal Grandfather        lung  . Prostate cancer Neg Hx     Allergies  Allergen Reactions  . Vicodin [Hydrocodone-Acetaminophen] Nausea And Vomiting    Medication list reviewed and updated in full in [place].  GEN: No fevers, chills. Nontoxic. Primarily MSK c/o today. MSK: Detailed in the HPI GI: tolerating PO intake without difficulty Neuro: No numbness, parasthesias, or tingling associated. Otherwise the pertinent positives of the ROS are noted above.   Objective:   BP (!) 148/80   Pulse 69   Temp (!) 97.5 F (36.4 C) (Oral)   Ht 6\' 2"  (1.88 m)   Wt 249 lb 4 oz (113.1 kg)   BMI 32.00 kg/m    GEN: WDWN, NAD, Non-toxic, Alert & Oriented x 3 HEENT: Atraumatic, Normocephalic.  Ears and Nose: No external deformity. EXTR: No clubbing/cyanosis/edema NEURO:  Normal gait.  PSYCH: Normally interactive. Conversant. Not depressed or anxious appearing.  Calm demeanor.   HIP EXAM: SIDE: L ROM: Abduction, Flexion, Internal and External range of motion: The patient lacks approximately 30 degrees of abduction.  With the hip flexed to 90 degrees he is able to rotate approximately 20 degrees of total motion. Pain with terminal IROM and EROM: In all directions. GTB: mild ttp SLR: NEG Knees: No effusion FABER: NT REVERSE FABER: NT, neg Piriformis: NT at direct palpation Str: flexion: 3/5 abduction: 4+/5 adduction: 3/5 Strength testing non-tender  Radiology: Dg Hip Unilat With Pelvis 2-3 Views Left  Result Date: 07/27/2017 CLINICAL DATA:  Persistent LEFT hip pain EXAM: DG HIP (WITH OR WITHOUT PELVIS) 2-3V LEFT COMPARISON:  CT abdomen and pelvis 02/24/2017 FINDINGS: Osseous demineralization. Narrowing of the hip joints bilaterally greater on LEFT. SI joints preserved. No fracture or dislocation. Degenerative disc disease changes at visualized lower lumbar spine. No bone destruction. IMPRESSION: Degenerative changes of BILATERAL hip joints greater on LEFT. Electronically Signed   By: Lavonia Dana M.D.   On: 07/27/2017 11:40    Results for orders placed or performed in visit on 08/67/61  Basic metabolic panel  Result Value Ref Range   Sodium 142 135 - 145 mEq/L   Potassium 4.3 3.5 - 5.1 mEq/L   Chloride 104 96 - 112 mEq/L   CO2 29 19 - 32 mEq/L   Glucose, Bld 105 (H) 70 - 99 mg/dL   BUN 17 6 - 23 mg/dL   Creatinine, Ser 1.12 0.40 - 1.50 mg/dL   Calcium 9.5 8.4 - 10.5 mg/dL   GFR 70.41 >60.00 mL/min     Assessment and Plan:   Left hip pain - Plan: DG HIP UNILAT WITH PELVIS 2-3 VIEWS LEFT, MR HIP LEFT W WO CONTRAST, Basic metabolic panel  Renal cell carcinoma of right kidney ([facility]) - Plan: MR HIP LEFT W WO CONTRAST, Basic metabolic panel  Primary localized osteoarthritis of left hip - Plan: MR HIP LEFT W WO CONTRAST, Basic metabolic panel  Patient  with a markedly abnormal history, markedly increased pain, more than would be expected even with end-stage arthritis.  Given the clinical history, discussed with the patient, and we will get an MRI of the left hip with and without contrast to evaluate for potential bony metastases from prior neoplasm, and rule out any potential occult fracture or stress fracture.  Follow-up: No follow-ups on file.  Meds ordered this encounter  Medications  . diazepam (VALIUM) 2 MG tablet    Sig: 1 tab po 45 mins before MRI, repeat if needed    Dispense:  2 tablet    Refill:  0   Medications Discontinued During This Encounter  Medication Reason  . predniSONE (DELTASONE) 20 MG tablet Completed Course  . diclofenac (VOLTAREN) 75 MG EC tablet Completed Course   Orders Placed This Encounter  Procedures  . DG HIP UNILAT WITH PELVIS 2-3 VIEWS LEFT  . MR HIP LEFT W WO CONTRAST  . Basic metabolic panel    Signed,  Frederico Hamman T. Beth Spackman, MD   Allergies as of 07/27/2017      Reactions   Vicodin [hydrocodone-acetaminophen] Nausea And Vomiting      Medication List        Accurate as of 07/27/17 11:59 PM. Always use your most recent med list.          diazepam 2 MG tablet Commonly known as:  VALIUM 1 tab po 45 mins before MRI, repeat if needed   enalapril 20 MG tablet Commonly known as:  VASOTEC TAKE TWO TABLETS BY MOUTH ONCE DAILY   Melatonin 3 MG Tabs Take 12 mg by mouth at bedtime.   omeprazole 20 MG capsule Commonly known as:  PRILOSEC Take 1 capsule (20 mg total) by mouth daily.

## 2017-07-27 ENCOUNTER — Encounter: Payer: Self-pay | Admitting: Family Medicine

## 2017-07-27 ENCOUNTER — Other Ambulatory Visit: Payer: Self-pay

## 2017-07-27 ENCOUNTER — Ambulatory Visit: Payer: BLUE CROSS/BLUE SHIELD | Admitting: Family Medicine

## 2017-07-27 ENCOUNTER — Ambulatory Visit (INDEPENDENT_AMBULATORY_CARE_PROVIDER_SITE_OTHER)
Admission: RE | Admit: 2017-07-27 | Discharge: 2017-07-27 | Disposition: A | Discharge status: Home / Self Care | Payer: BLUE CROSS/BLUE SHIELD | Source: Ambulatory Visit | Attending: Family Medicine | Admitting: Family Medicine

## 2017-07-27 VITALS — BP 148/80 | HR 69 | Temp 97.5°F | Ht 74.0 in | Wt 249.2 lb

## 2017-07-27 DIAGNOSIS — M1612 Unilateral primary osteoarthritis, left hip: Secondary | ICD-10-CM | POA: Diagnosis not present

## 2017-07-27 DIAGNOSIS — M25552 Pain in left hip: Secondary | ICD-10-CM | POA: Diagnosis not present

## 2017-07-27 DIAGNOSIS — C641 Malignant neoplasm of right kidney, except renal pelvis: Secondary | ICD-10-CM

## 2017-07-27 DIAGNOSIS — M160 Bilateral primary osteoarthritis of hip: Secondary | ICD-10-CM | POA: Diagnosis not present

## 2017-07-27 LAB — BASIC METABOLIC PANEL
BUN: 17 mg/dL (ref 6–23)
CO2: 29 meq/L (ref 19–32)
Calcium: 9.5 mg/dL (ref 8.4–10.5)
Chloride: 104 mEq/L (ref 96–112)
Creatinine, Ser: 1.12 mg/dL (ref 0.40–1.50)
GFR: 70.41 mL/min (ref >60.00)
Glucose, Bld: 105 mg/dL — ABNORMAL HIGH (ref 70–99)
Potassium: 4.3 mEq/L (ref 3.5–5.1)
SODIUM: 142 meq/L (ref 135–145)

## 2017-07-27 MED ORDER — DIAZEPAM 2 MG PO TABS: ORAL_TABLET | ORAL | 0 refills | Status: DC

## 2017-07-28 MED ORDER — TRAMADOL HCL 50 MG PO TABS: 50.0000 mg | ORAL_TABLET | Freq: Four times a day (QID) | ORAL | 0 refills | Status: AC | PRN

## 2017-07-28 NOTE — Addendum Note (Signed)
Addended by: Owens Loffler on: 07/28/2017 01:55 PM   Modules accepted: Orders

## 2017-08-02 ENCOUNTER — Ambulatory Visit
Admission: RE | Admit: 2017-08-02 | Discharge: 2017-08-02 | Disposition: A | Discharge status: Home / Self Care | Payer: BLUE CROSS/BLUE SHIELD | Source: Ambulatory Visit | Attending: Family Medicine | Admitting: Family Medicine

## 2017-08-02 DIAGNOSIS — C641 Malignant neoplasm of right kidney, except renal pelvis: Secondary | ICD-10-CM | POA: Diagnosis not present

## 2017-08-02 DIAGNOSIS — M25552 Pain in left hip: Secondary | ICD-10-CM | POA: Insufficient documentation

## 2017-08-02 DIAGNOSIS — M1612 Unilateral primary osteoarthritis, left hip: Secondary | ICD-10-CM

## 2017-08-02 DIAGNOSIS — M25452 Effusion, left hip: Secondary | ICD-10-CM | POA: Diagnosis not present

## 2017-08-02 MED ORDER — GADOBENATE DIMEGLUMINE 529 MG/ML IV SOLN
20.0000 mL | Freq: Once | INTRAVENOUS | Status: AC | PRN
  Administered 2017-08-02: 20 mL via INTRAVENOUS

## 2017-08-04 ENCOUNTER — Other Ambulatory Visit: Payer: Self-pay | Admitting: Family Medicine

## 2017-08-04 DIAGNOSIS — M25552 Pain in left hip: Secondary | ICD-10-CM

## 2017-08-04 DIAGNOSIS — M1990 Unspecified osteoarthritis, unspecified site: Secondary | ICD-10-CM

## 2017-08-04 DIAGNOSIS — R9389 Abnormal findings on diagnostic imaging of other specified body structures: Secondary | ICD-10-CM

## 2017-08-04 NOTE — Progress Notes (Signed)
Consulting Rheumatology

## 2017-08-11 ENCOUNTER — Other Ambulatory Visit: Payer: Self-pay | Admitting: Family Medicine

## 2017-08-11 DIAGNOSIS — M1612 Unilateral primary osteoarthritis, left hip: Secondary | ICD-10-CM | POA: Diagnosis not present

## 2017-08-11 DIAGNOSIS — M25452 Effusion, left hip: Secondary | ICD-10-CM | POA: Diagnosis not present

## 2017-08-11 MED ORDER — TRAMADOL HCL 50 MG PO TABS: 50.0000 mg | ORAL_TABLET | Freq: Four times a day (QID) | ORAL | 1 refills | Status: DC | PRN

## 2017-08-11 NOTE — Telephone Encounter (Signed)
Please sign prescription to send electronically. 

## 2017-08-11 NOTE — Telephone Encounter (Signed)
Tramadol 50 mg, 1 po q 6 hours prn pain, #40, 1 refills   I last wrote for this 2 weeks ago.

## 2017-08-11 NOTE — Telephone Encounter (Signed)
Copied from [place] [id number]. Topic: Quick Communication - See Telephone Encounter >> Aug 11, 2017 11:53 AM Ahmed Prima L wrote: CRM for notification. See Telephone encounter for: 08/11/17.  traMADol (ULTRAM) 50 MG tablet [[id number]]  ENDED   Michela Pitcher he saw the Rheumatologist this morning and he has sent him to a orthopedic doctor but they can not see him until next week. He is requesting just enough to get him until that appointment   [place], [place]  Call back if needed [phone]

## 2017-08-11 NOTE — Telephone Encounter (Signed)
Tramadol not on pts current list. Last OV 07/2017

## 2017-08-19 DIAGNOSIS — M1612 Unilateral primary osteoarthritis, left hip: Secondary | ICD-10-CM | POA: Diagnosis not present

## 2017-08-19 DIAGNOSIS — M25452 Effusion, left hip: Secondary | ICD-10-CM | POA: Diagnosis not present

## 2017-08-21 ENCOUNTER — Encounter: Payer: Self-pay | Admitting: Family Medicine

## 2017-08-22 NOTE — Telephone Encounter (Signed)
I spoke with Nicholas Thompson on the phone regarding his whole case. THA is next week.

## 2017-08-23 ENCOUNTER — Other Ambulatory Visit: Payer: Self-pay

## 2017-08-23 ENCOUNTER — Encounter: Payer: Self-pay | Admitting: Urology

## 2017-08-23 ENCOUNTER — Encounter
Admission: RE | Admit: 2017-08-23 | Discharge: 2017-08-23 | Disposition: A | Discharge status: Home / Self Care | Payer: BLUE CROSS/BLUE SHIELD | Source: Ambulatory Visit | Attending: Orthopedic Surgery | Admitting: Orthopedic Surgery

## 2017-08-23 DIAGNOSIS — Z01810 Encounter for preprocedural cardiovascular examination: Secondary | ICD-10-CM | POA: Insufficient documentation

## 2017-08-23 DIAGNOSIS — Z01812 Encounter for preprocedural laboratory examination: Secondary | ICD-10-CM | POA: Insufficient documentation

## 2017-08-23 DIAGNOSIS — I10 Essential (primary) hypertension: Secondary | ICD-10-CM | POA: Diagnosis not present

## 2017-08-23 HISTORY — DX: Hyperlipidemia, unspecified: E78.5

## 2017-08-23 HISTORY — DX: Other specified postprocedural states: Z98.890

## 2017-08-23 HISTORY — DX: Personal history of urinary calculi: Z87.442

## 2017-08-23 HISTORY — DX: Nausea with vomiting, unspecified: R11.2

## 2017-08-23 LAB — TYPE AND SCREEN
ABO/RH(D): O POS
Antibody Screen: NEGATIVE

## 2017-08-23 LAB — URINALYSIS, ROUTINE W REFLEX MICROSCOPIC
BILIRUBIN URINE: NEGATIVE
GLUCOSE, UA: NEGATIVE mg/dL
HGB URINE DIPSTICK: NEGATIVE
Ketones, ur: NEGATIVE mg/dL
Leukocytes, UA: NEGATIVE
Nitrite: NEGATIVE
PROTEIN: NEGATIVE mg/dL
Specific Gravity, Urine: 1.026 (ref 1.005–1.030)
pH: 5 (ref 5.0–8.0)

## 2017-08-23 LAB — PROTIME-INR
INR: 0.9
Prothrombin Time: 12.1 seconds (ref 11.4–15.2)

## 2017-08-23 LAB — SURGICAL PCR SCREEN
MRSA, PCR: NEGATIVE
Staphylococcus aureus: NEGATIVE

## 2017-08-23 LAB — APTT: APTT: 28 s (ref 24–36)

## 2017-08-23 NOTE — Patient Instructions (Signed)
Your procedure is scheduled on: Tuesday, August 30, 2017 Report to Day Surgery on the 2nd floor of the [facility]. To find out your arrival time, please call [phone] between 1PM - 3PM on: Monday, August 29, 2017  REMEMBER: Instructions that are not followed completely may result in serious medical risk, up to and including death; or upon the discretion of your surgeon and anesthesiologist your surgery may need to be rescheduled.  Do not eat food after midnight the night before your procedure.  No gum chewing, lozengers or hard candies.  You may however, drink CLEAR liquids up to 2 hours before you are scheduled to arrive for your surgery. Do not drink anything within 2 hours of the start of your surgery.  Clear liquids include: - water  - apple juice without pulp - clear gatorade - black coffee or tea (Do NOT add anything to the coffee or tea) Do NOT drink anything that is not on this list.  No Alcohol for 24 hours before or after surgery.  No Smoking including e-cigarettes for 24 hours prior to surgery.  No chewable tobacco products for at least 6 hours prior to surgery.  No nicotine patches on the day of surgery.  On the morning of surgery brush your teeth with toothpaste and water, you may rinse your mouth with mouthwash if you wish. Do not swallow any toothpaste or mouthwash.  Notify your doctor if there is any change in your medical condition (cold, fever, infection).  Do not wear jewelry, make-up, hairpins, clips or nail polish.  Do not wear lotions, powders, or perfumes. You may wear deodorant.  Do not shave 48 hours prior to surgery. Men may shave face and neck.  Contacts and dentures may not be worn into surgery.  Do not bring valuables to the hospital, including drivers license, insurance or credit cards.  [facility] is not responsible for any belongings or valuables.   TAKE THESE MEDICATIONS THE MORNING OF SURGERY:  1.  OMEPRAZOLE (take one the night before  surgery and one the morning of surgery - helps to prevent nausea after surgery) 2.  TRAMADOL (if needed for pain)  Use CHG Soap as directed on instruction sheet.  NOW!  Stop Anti-inflammatories (NSAIDS) such as Advil, Aleve, Ibuprofen, Motrin, Naproxen, Naprosyn and Aspirin based products such as Excedrin, Goodys Powder, BC Powder. (May take TRAMADOL OR Tylenol or Acetaminophen if needed.)  NOW!  Stop ANY OVER THE COUNTER supplements until after surgery.  Wear comfortable clothing (specific to your surgery type) to the hospital.  Plan for stool softeners for home use.  If you are being admitted to the hospital overnight, leave your suitcase in the car. After surgery it may be brought to your room.  Please call [phone] if you have any questions about these instructions.

## 2017-08-24 LAB — URINE CULTURE: Culture: NO GROWTH

## 2017-08-26 ENCOUNTER — Ambulatory Visit: Payer: BLUE CROSS/BLUE SHIELD

## 2017-08-29 MED ORDER — CEFAZOLIN SODIUM-DEXTROSE 2-4 GM/100ML-% IV SOLN
2.0000 g | Freq: Once | INTRAVENOUS | Status: AC
  Administered 2017-08-30: 2 g via INTRAVENOUS

## 2017-08-30 ENCOUNTER — Other Ambulatory Visit: Payer: Self-pay

## 2017-08-30 ENCOUNTER — Inpatient Hospital Stay: Payer: BLUE CROSS/BLUE SHIELD | Admitting: Anesthesiology

## 2017-08-30 ENCOUNTER — Inpatient Hospital Stay: Payer: BLUE CROSS/BLUE SHIELD

## 2017-08-30 ENCOUNTER — Encounter
Admission: RE | Disposition: A | Discharge status: Skilled Nursing Facility | Payer: Self-pay | Source: Ambulatory Visit | Attending: Orthopedic Surgery

## 2017-08-30 ENCOUNTER — Inpatient Hospital Stay
Admission: RE | Admit: 2017-08-30 | Discharge: 2017-09-03 | DRG: 470 | Disposition: A | Discharge status: Skilled Nursing Facility | Payer: BLUE CROSS/BLUE SHIELD | Source: Ambulatory Visit | Attending: Orthopedic Surgery | Admitting: Orthopedic Surgery

## 2017-08-30 DIAGNOSIS — Z87442 Personal history of urinary calculi: Secondary | ICD-10-CM

## 2017-08-30 DIAGNOSIS — M6281 Muscle weakness (generalized): Secondary | ICD-10-CM | POA: Diagnosis not present

## 2017-08-30 DIAGNOSIS — I10 Essential (primary) hypertension: Secondary | ICD-10-CM | POA: Diagnosis not present

## 2017-08-30 DIAGNOSIS — R262 Difficulty in walking, not elsewhere classified: Secondary | ICD-10-CM | POA: Diagnosis not present

## 2017-08-30 DIAGNOSIS — Z905 Acquired absence of kidney: Secondary | ICD-10-CM | POA: Diagnosis not present

## 2017-08-30 DIAGNOSIS — K219 Gastro-esophageal reflux disease without esophagitis: Secondary | ICD-10-CM | POA: Diagnosis present

## 2017-08-30 DIAGNOSIS — Z471 Aftercare following joint replacement surgery: Secondary | ICD-10-CM | POA: Diagnosis not present

## 2017-08-30 DIAGNOSIS — F5101 Primary insomnia: Secondary | ICD-10-CM | POA: Diagnosis not present

## 2017-08-30 DIAGNOSIS — M1612 Unilateral primary osteoarthritis, left hip: Secondary | ICD-10-CM | POA: Diagnosis not present

## 2017-08-30 DIAGNOSIS — E785 Hyperlipidemia, unspecified: Secondary | ICD-10-CM | POA: Diagnosis present

## 2017-08-30 DIAGNOSIS — Z419 Encounter for procedure for purposes other than remedying health state, unspecified: Secondary | ICD-10-CM

## 2017-08-30 DIAGNOSIS — Z5189 Encounter for other specified aftercare: Secondary | ICD-10-CM | POA: Diagnosis not present

## 2017-08-30 DIAGNOSIS — Z96642 Presence of left artificial hip joint: Secondary | ICD-10-CM | POA: Diagnosis not present

## 2017-08-30 DIAGNOSIS — S72143D Displaced intertrochanteric fracture of unspecified femur, subsequent encounter for closed fracture with routine healing: Secondary | ICD-10-CM | POA: Diagnosis not present

## 2017-08-30 DIAGNOSIS — Z86010 Personal history of colonic polyps: Secondary | ICD-10-CM

## 2017-08-30 DIAGNOSIS — Z885 Allergy status to narcotic agent status: Secondary | ICD-10-CM

## 2017-08-30 DIAGNOSIS — F419 Anxiety disorder, unspecified: Secondary | ICD-10-CM | POA: Diagnosis not present

## 2017-08-30 DIAGNOSIS — M25552 Pain in left hip: Secondary | ICD-10-CM | POA: Diagnosis not present

## 2017-08-30 DIAGNOSIS — R21 Rash and other nonspecific skin eruption: Secondary | ICD-10-CM | POA: Diagnosis not present

## 2017-08-30 DIAGNOSIS — N189 Chronic kidney disease, unspecified: Secondary | ICD-10-CM | POA: Diagnosis present

## 2017-08-30 DIAGNOSIS — I129 Hypertensive chronic kidney disease with stage 1 through stage 4 chronic kidney disease, or unspecified chronic kidney disease: Secondary | ICD-10-CM | POA: Diagnosis not present

## 2017-08-30 DIAGNOSIS — Z85528 Personal history of other malignant neoplasm of kidney: Secondary | ICD-10-CM | POA: Diagnosis not present

## 2017-08-30 DIAGNOSIS — G8929 Other chronic pain: Secondary | ICD-10-CM | POA: Diagnosis not present

## 2017-08-30 DIAGNOSIS — K5900 Constipation, unspecified: Secondary | ICD-10-CM | POA: Diagnosis not present

## 2017-08-30 DIAGNOSIS — Z79899 Other long term (current) drug therapy: Secondary | ICD-10-CM

## 2017-08-30 DIAGNOSIS — R52 Pain, unspecified: Secondary | ICD-10-CM | POA: Diagnosis not present

## 2017-08-30 DIAGNOSIS — Z85828 Personal history of other malignant neoplasm of skin: Secondary | ICD-10-CM

## 2017-08-30 DIAGNOSIS — Z9181 History of falling: Secondary | ICD-10-CM | POA: Diagnosis not present

## 2017-08-30 DIAGNOSIS — Z7401 Bed confinement status: Secondary | ICD-10-CM | POA: Diagnosis not present

## 2017-08-30 DIAGNOSIS — G8918 Other acute postprocedural pain: Secondary | ICD-10-CM

## 2017-08-30 HISTORY — PX: TOTAL HIP ARTHROPLASTY: SHX124

## 2017-08-30 LAB — CBC
HCT: 41.9 % (ref 40.0–52.0)
Hemoglobin: 14.4 g/dL (ref 13.0–18.0)
MCH: 31 pg (ref 26.0–34.0)
MCHC: 34.4 g/dL (ref 32.0–36.0)
MCV: 90 fL (ref 80.0–100.0)
PLATELETS: 193 10*3/uL (ref 150–440)
RBC: 4.66 MIL/uL (ref 4.40–5.90)
RDW: 12.7 % (ref 11.5–14.5)
WBC: 18.5 10*3/uL — ABNORMAL HIGH (ref 3.8–10.6)

## 2017-08-30 LAB — CREATININE, SERUM
Creatinine, Ser: 0.89 mg/dL (ref 0.61–1.24)
GFR calc Af Amer: >60 mL/min (ref >60)
GFR calc non Af Amer: >60 mL/min (ref >60)

## 2017-08-30 SURGERY — ARTHROPLASTY, HIP, TOTAL, ANTERIOR APPROACH
Anesthesia: Spinal | Site: Hip | Laterality: Left | Wound class: Clean

## 2017-08-30 MED ORDER — BUPIVACAINE HCL (PF) 0.5 % IJ SOLN
INTRAMUSCULAR | Status: AC
  Filled 2017-08-30: qty 10

## 2017-08-30 MED ORDER — MENTHOL 3 MG MT LOZG
1.0000 | LOZENGE | OROMUCOSAL | Status: DC | PRN
  Filled 2017-08-30: qty 9

## 2017-08-30 MED ORDER — PROPOFOL 500 MG/50ML IV EMUL
INTRAVENOUS | Status: AC
  Filled 2017-08-30: qty 50

## 2017-08-30 MED ORDER — HYDROMORPHONE HCL 1 MG/ML IJ SOLN
0.5000 mg | INTRAMUSCULAR | Status: DC | PRN
  Administered 2017-08-30 (×2): 1 mg via INTRAVENOUS
  Filled 2017-08-30 (×2): qty 1

## 2017-08-30 MED ORDER — METHOCARBAMOL 500 MG PO TABS
500.0000 mg | ORAL_TABLET | Freq: Four times a day (QID) | ORAL | Status: DC | PRN
  Administered 2017-08-30 – 2017-09-02 (×4): 500 mg via ORAL
  Filled 2017-08-30 (×4): qty 1

## 2017-08-30 MED ORDER — MAGNESIUM CITRATE PO SOLN
1.0000 | Freq: Once | ORAL | Status: DC | PRN
  Filled 2017-08-30: qty 296

## 2017-08-30 MED ORDER — METOCLOPRAMIDE HCL 5 MG/ML IJ SOLN: 5.0000 mg | Freq: Three times a day (TID) | INTRAMUSCULAR | Status: DC | PRN

## 2017-08-30 MED ORDER — SCOPOLAMINE 1 MG/3DAYS TD PT72: 1.0000 | MEDICATED_PATCH | Freq: Once | TRANSDERMAL | Status: DC

## 2017-08-30 MED ORDER — BUPIVACAINE HCL (PF) 0.5 % IJ SOLN
INTRAMUSCULAR | Status: DC | PRN
  Administered 2017-08-30: 3 mL

## 2017-08-30 MED ORDER — ONDANSETRON HCL 4 MG/2ML IJ SOLN
4.0000 mg | Freq: Four times a day (QID) | INTRAMUSCULAR | Status: DC | PRN
Start: 2017-08-30 — End: 2017-09-03
  Administered 2017-09-01: 4 mg via INTRAVENOUS
  Filled 2017-08-30 (×2): qty 2

## 2017-08-30 MED ORDER — TRANEXAMIC ACID 1000 MG/10ML IV SOLN
1000.0000 mg | INTRAVENOUS | Status: AC
  Administered 2017-08-30: 1000 mg via INTRAVENOUS
  Filled 2017-08-30: qty 10

## 2017-08-30 MED ORDER — BUPIVACAINE-EPINEPHRINE (PF) 0.25% -1:200000 IJ SOLN
INTRAMUSCULAR | Status: DC | PRN
  Administered 2017-08-30: 30 mL via PERINEURAL

## 2017-08-30 MED ORDER — PROPOFOL 500 MG/50ML IV EMUL
INTRAVENOUS | Status: DC | PRN
  Administered 2017-08-30: 75 ug/kg/min via INTRAVENOUS

## 2017-08-30 MED ORDER — PROPOFOL 10 MG/ML IV BOLUS
INTRAVENOUS | Status: AC
  Filled 2017-08-30: qty 20

## 2017-08-30 MED ORDER — OXYCODONE HCL 5 MG PO TABS
5.0000 mg | ORAL_TABLET | ORAL | Status: DC | PRN
  Administered 2017-08-30 – 2017-09-01 (×7): 10 mg via ORAL
  Filled 2017-08-30 (×5): qty 2

## 2017-08-30 MED ORDER — METOCLOPRAMIDE HCL 10 MG PO TABS: 5.0000 mg | ORAL_TABLET | Freq: Three times a day (TID) | ORAL | Status: DC | PRN

## 2017-08-30 MED ORDER — MELATONIN 5 MG PO TABS
10.0000 mg | ORAL_TABLET | Freq: Every evening | ORAL | Status: DC | PRN
  Administered 2017-08-31: 10 mg via ORAL
  Filled 2017-08-30: qty 2

## 2017-08-30 MED ORDER — LIDOCAINE HCL (PF) 2 % IJ SOLN
INTRAMUSCULAR | Status: AC
  Filled 2017-08-30: qty 10

## 2017-08-30 MED ORDER — ALUM & MAG HYDROXIDE-SIMETH 200-200-20 MG/5ML PO SUSP: 30.0000 mL | ORAL | Status: DC | PRN

## 2017-08-30 MED ORDER — PHENOL 1.4 % MT LIQD
1.0000 | OROMUCOSAL | Status: DC | PRN
  Filled 2017-08-30: qty 177

## 2017-08-30 MED ORDER — ENOXAPARIN SODIUM 40 MG/0.4ML ~~LOC~~ SOLN
40.0000 mg | SUBCUTANEOUS | Status: DC
  Administered 2017-08-31 – 2017-09-03 (×4): 40 mg via SUBCUTANEOUS
  Filled 2017-08-30 (×4): qty 0.4

## 2017-08-30 MED ORDER — SODIUM CHLORIDE 0.9 % IV SOLN
INTRAVENOUS | Status: DC | PRN
  Administered 2017-08-30: 25 ug/min via INTRAVENOUS

## 2017-08-30 MED ORDER — BISACODYL 5 MG PO TBEC
5.0000 mg | DELAYED_RELEASE_TABLET | Freq: Every day | ORAL | Status: DC | PRN
  Administered 2017-09-01: 5 mg via ORAL
  Filled 2017-08-30: qty 1

## 2017-08-30 MED ORDER — FENTANYL CITRATE (PF) 100 MCG/2ML IJ SOLN: 25.0000 ug | INTRAMUSCULAR | Status: DC | PRN

## 2017-08-30 MED ORDER — PHENYLEPHRINE HCL 10 MG/ML IJ SOLN
INTRAMUSCULAR | Status: AC
  Filled 2017-08-30: qty 1

## 2017-08-30 MED ORDER — KETOCONAZOLE 2 % EX CREA
1.0000 "application " | TOPICAL_CREAM | Freq: Every day | CUTANEOUS | Status: DC | PRN
  Filled 2017-08-30: qty 15

## 2017-08-30 MED ORDER — METHOCARBAMOL 1000 MG/10ML IJ SOLN
500.0000 mg | Freq: Four times a day (QID) | INTRAVENOUS | Status: DC | PRN
  Filled 2017-08-30: qty 5

## 2017-08-30 MED ORDER — ONDANSETRON HCL 4 MG/2ML IJ SOLN: 4.0000 mg | Freq: Once | INTRAMUSCULAR | Status: DC | PRN

## 2017-08-30 MED ORDER — TRAMADOL HCL 50 MG PO TABS
50.0000 mg | ORAL_TABLET | Freq: Four times a day (QID) | ORAL | Status: DC
  Administered 2017-09-01: 50 mg via ORAL
  Filled 2017-08-30 (×2): qty 1

## 2017-08-30 MED ORDER — PROPOFOL 10 MG/ML IV BOLUS
INTRAVENOUS | Status: DC | PRN
  Administered 2017-08-30: 50 mg via INTRAVENOUS
  Administered 2017-08-30: 30 mg via INTRAVENOUS

## 2017-08-30 MED ORDER — SCOPOLAMINE 1 MG/3DAYS TD PT72
MEDICATED_PATCH | TRANSDERMAL | Status: AC
  Administered 2017-08-30: 1.5 mg
  Filled 2017-08-30: qty 1

## 2017-08-30 MED ORDER — DOCUSATE SODIUM 100 MG PO CAPS
100.0000 mg | ORAL_CAPSULE | Freq: Two times a day (BID) | ORAL | Status: DC
  Administered 2017-08-30 – 2017-09-02 (×7): 100 mg via ORAL
  Filled 2017-08-30 (×8): qty 1

## 2017-08-30 MED ORDER — SENNOSIDES-DOCUSATE SODIUM 8.6-50 MG PO TABS
1.0000 | ORAL_TABLET | Freq: Every evening | ORAL | Status: DC | PRN
  Administered 2017-08-31 – 2017-09-01 (×2): 1 via ORAL
  Filled 2017-08-30 (×2): qty 1

## 2017-08-30 MED ORDER — MIDAZOLAM HCL 5 MG/5ML IJ SOLN
INTRAMUSCULAR | Status: DC | PRN
  Administered 2017-08-30: 2 mg via INTRAVENOUS

## 2017-08-30 MED ORDER — BUPIVACAINE-EPINEPHRINE (PF) 0.25% -1:200000 IJ SOLN
INTRAMUSCULAR | Status: AC
  Filled 2017-08-30: qty 30

## 2017-08-30 MED ORDER — DIPHENHYDRAMINE HCL 12.5 MG/5ML PO ELIX: 12.5000 mg | ORAL_SOLUTION | ORAL | Status: DC | PRN

## 2017-08-30 MED ORDER — ZOLPIDEM TARTRATE 5 MG PO TABS: 5.0000 mg | ORAL_TABLET | Freq: Every evening | ORAL | Status: DC | PRN

## 2017-08-30 MED ORDER — SODIUM CHLORIDE 0.9 % IV SOLN
INTRAVENOUS | Status: DC
  Administered 2017-08-30 – 2017-08-31 (×2): via INTRAVENOUS

## 2017-08-30 MED ORDER — GLYCOPYRROLATE 0.2 MG/ML IJ SOLN
INTRAMUSCULAR | Status: AC
  Administered 2017-08-30: 0.2 mg via INTRAVENOUS
  Filled 2017-08-30: qty 1

## 2017-08-30 MED ORDER — ONDANSETRON HCL 4 MG PO TABS
4.0000 mg | ORAL_TABLET | Freq: Four times a day (QID) | ORAL | Status: DC | PRN
  Administered 2017-09-01: 4 mg via ORAL
  Filled 2017-08-30: qty 1

## 2017-08-30 MED ORDER — ENALAPRIL MALEATE 20 MG PO TABS
40.0000 mg | ORAL_TABLET | Freq: Every day | ORAL | Status: DC
  Administered 2017-08-31 – 2017-09-03 (×4): 40 mg via ORAL
  Filled 2017-08-30 (×5): qty 2

## 2017-08-30 MED ORDER — GLYCOPYRROLATE 0.2 MG/ML IJ SOLN
0.2000 mg | Freq: Once | INTRAMUSCULAR | Status: AC
  Administered 2017-08-30: 0.2 mg via INTRAVENOUS

## 2017-08-30 MED ORDER — MIDAZOLAM HCL 2 MG/2ML IJ SOLN
INTRAMUSCULAR | Status: AC
  Filled 2017-08-30: qty 2

## 2017-08-30 MED ORDER — PANTOPRAZOLE SODIUM 40 MG PO TBEC
40.0000 mg | DELAYED_RELEASE_TABLET | Freq: Every day | ORAL | Status: DC
  Administered 2017-08-31 – 2017-09-03 (×4): 40 mg via ORAL
  Filled 2017-08-30 (×4): qty 1

## 2017-08-30 MED ORDER — CEFAZOLIN SODIUM-DEXTROSE 2-4 GM/100ML-% IV SOLN
2.0000 g | Freq: Four times a day (QID) | INTRAVENOUS | Status: AC
  Administered 2017-08-31 (×2): 2 g via INTRAVENOUS
  Filled 2017-08-30 (×3): qty 100

## 2017-08-30 MED ORDER — OXYCODONE HCL 5 MG PO TABS
10.0000 mg | ORAL_TABLET | ORAL | Status: DC | PRN
  Administered 2017-08-31: 15 mg via ORAL
  Filled 2017-08-30: qty 2
  Filled 2017-08-30 (×2): qty 3

## 2017-08-30 MED ORDER — SODIUM CHLORIDE 0.9 % IV SOLN
INTRAVENOUS | Status: DC
  Administered 2017-08-30 (×2): via INTRAVENOUS

## 2017-08-30 MED ORDER — NEOMYCIN-POLYMYXIN B GU 40-200000 IR SOLN
Status: DC | PRN
  Administered 2017-08-30: 4 mL

## 2017-08-30 MED ORDER — CEFAZOLIN SODIUM-DEXTROSE 2-4 GM/100ML-% IV SOLN
INTRAVENOUS | Status: AC
  Filled 2017-08-30: qty 100

## 2017-08-30 MED ORDER — ACETAMINOPHEN 500 MG PO TABS
1000.0000 mg | ORAL_TABLET | Freq: Four times a day (QID) | ORAL | Status: AC
  Administered 2017-08-31: 1000 mg via ORAL
  Filled 2017-08-30 (×3): qty 2

## 2017-08-30 SURGICAL SUPPLY — 58 items
BLADE SAGITTAL AGGR TOOTH XLG (BLADE) ×2 IMPLANT
BNDG COHESIVE 6X5 TAN STRL LF (GAUZE/BANDAGES/DRESSINGS) ×4 IMPLANT
CANISTER SUCT 1200ML W/VALVE (MISCELLANEOUS) ×2 IMPLANT
CHLORAPREP W/TINT 26ML (MISCELLANEOUS) ×4 IMPLANT
DRAPE C-ARM XRAY 36X54 (DRAPES) ×2 IMPLANT
DRAPE INCISE IOBAN 66X60 STRL (DRAPES) IMPLANT
DRAPE POUCH INSTRU U-SHP 10X18 (DRAPES) ×2 IMPLANT
DRAPE SHEET LG 3/4 BI-LAMINATE (DRAPES) ×6 IMPLANT
DRAPE TABLE BACK 80X90 (DRAPES) ×2 IMPLANT
DRESSING SURGICEL FIBRLLR 1X2 (HEMOSTASIS) ×2 IMPLANT
DRSG OPSITE POSTOP 4X8 (GAUZE/BANDAGES/DRESSINGS) ×4 IMPLANT
DRSG SURGICEL FIBRILLAR 1X2 (HEMOSTASIS) ×4
ELECT BLADE 6.5 EXT (BLADE) ×2 IMPLANT
ELECT REM PT RETURN 9FT ADLT (ELECTROSURGICAL) ×2
ELECTRODE REM PT RTRN 9FT ADLT (ELECTROSURGICAL) ×1 IMPLANT
GLOVE BIOGEL PI IND STRL 7.0 (GLOVE) ×5 IMPLANT
GLOVE BIOGEL PI IND STRL 9 (GLOVE) ×1 IMPLANT
GLOVE BIOGEL PI INDICATOR 7.0 (GLOVE) ×5
GLOVE BIOGEL PI INDICATOR 9 (GLOVE) ×1
GLOVE SURG SYN 9.0  PF PI (GLOVE) ×2
GLOVE SURG SYN 9.0 PF PI (GLOVE) ×2 IMPLANT
GOWN SRG 2XL LVL 4 RGLN SLV (GOWNS) ×1 IMPLANT
GOWN STRL NON-REIN 2XL LVL4 (GOWNS) ×1
GOWN STRL REUS W/ TWL LRG LVL3 (GOWN DISPOSABLE) ×1 IMPLANT
GOWN STRL REUS W/TWL LRG LVL3 (GOWN DISPOSABLE) ×1
HEAD FEMORAL 28MM SZ S (Head) ×2 IMPLANT
HEMOVAC 400CC 10FR (MISCELLANEOUS) IMPLANT
HOLDER FOLEY CATH W/STRAP (MISCELLANEOUS) ×2 IMPLANT
HOOD PEEL AWAY FLYTE STAYCOOL (MISCELLANEOUS) ×2 IMPLANT
KIT PREVENA INCISION MGT 13 (CANNISTER) IMPLANT
LINER DM 28MM (Liner) ×2 IMPLANT
LINER DM SZH 28X56 (Liner) ×1 IMPLANT
MAT BLUE FLOOR 46X72 FLO (MISCELLANEOUS) ×2 IMPLANT
NDL SAFETY ECLIPSE 18X1.5 (NEEDLE) ×1 IMPLANT
NEEDLE HYPO 18GX1.5 SHARP (NEEDLE) ×1
NEEDLE SPNL 18GX3.5 QUINCKE PK (NEEDLE) ×2 IMPLANT
NS IRRIG 1000ML POUR BTL (IV SOLUTION) ×2 IMPLANT
PACK HIP COMPR (MISCELLANEOUS) ×2 IMPLANT
SHELL ACETABULAR DM  56MM (Shell) ×2 IMPLANT
SOL PREP PVP 2OZ (MISCELLANEOUS) ×2
SOLUTION PREP PVP 2OZ (MISCELLANEOUS) ×1 IMPLANT
SPONGE DRAIN TRACH 4X4 STRL 2S (GAUZE/BANDAGES/DRESSINGS) IMPLANT
STAPLER SKIN PROX 35W (STAPLE) ×2 IMPLANT
STEM FEMORAL SZ5 STD COLLARED (Stem) ×2 IMPLANT
STRAP SAFETY 5IN WIDE (MISCELLANEOUS) ×2 IMPLANT
SUT DVC 2 QUILL PDO  T11 36X36 (SUTURE) ×1
SUT DVC 2 QUILL PDO T11 36X36 (SUTURE) ×1 IMPLANT
SUT SILK 0 (SUTURE) ×1
SUT SILK 0 30XBRD TIE 6 (SUTURE) ×1 IMPLANT
SUT V-LOC 90 ABS DVC 3-0 CL (SUTURE) ×2 IMPLANT
SUT VIC AB 1 CT1 36 (SUTURE) ×2 IMPLANT
SYR 20CC LL (SYRINGE) ×2 IMPLANT
SYR 30ML LL (SYRINGE) ×2 IMPLANT
SYR BULB IRRIG 60ML STRL (SYRINGE) ×2 IMPLANT
TAPE MICROFOAM 4IN (TAPE) IMPLANT
TOWEL OR 17X26 4PK STRL BLUE (TOWEL DISPOSABLE) ×2 IMPLANT
TRAY FOLEY MTR SLVR 16FR STAT (SET/KITS/TRAYS/PACK) ×2 IMPLANT
WND VAC CANISTER 500ML (MISCELLANEOUS) IMPLANT

## 2017-08-30 NOTE — H&P (Signed)
Reviewed paper H+P, will be scanned into chart. No changes noted.  

## 2017-08-30 NOTE — Anesthesia Procedure Notes (Addendum)
Spinal  Patient location during procedure: OR Start time: 08/30/2017 3:05 PM End time: 08/30/2017 3:10 PM Staffing Resident/CRNA: Nelda Marseille, CRNA Performed: resident/CRNA  Preanesthetic Checklist Completed: patient identified, site marked, surgical consent, pre-op evaluation, timeout performed, IV checked, risks and benefits discussed and monitors and equipment checked Spinal Block Patient position: sitting Prep: Betadine Patient monitoring: heart rate, continuous pulse ox, blood pressure and cardiac monitor Approach: right paramedian Location: L3-4 Injection technique: single-shot Needle Needle type: [place]acre and Introducer  Needle gauge: 25 G Needle length: 9 cm Assessment Sensory level: T10 Additional Notes Negative paresthesia. Negative blood return. Positive free-flowing CSF. Expiration date of kit checked and confirmed. Patient tolerated procedure well, without complications.

## 2017-08-30 NOTE — Anesthesia Preprocedure Evaluation (Signed)
Anesthesia Evaluation  Patient identified by MRN, date of birth, ID band Patient awake    Reviewed: Allergy & Precautions, NPO status , Patient's Chart, lab work & pertinent test results  History of Anesthesia Complications (+) PONV and history of anesthetic complications  Airway Mallampati: II       Dental  (+) Partial Lower, Partial Upper   Pulmonary neg sleep apnea, neg COPD, former smoker,           Cardiovascular hypertension, Pt. on medications (-) Past MI and (-) CHF (-) dysrhythmias (-) Valvular Problems/Murmurs     Neuro/Psych neg Seizures    GI/Hepatic Neg liver ROS, GERD  Medicated and Controlled,  Endo/Other  neg diabetes  Renal/GU Renal InsufficiencyRenal disease (s/p nephrectomy)     Musculoskeletal   Abdominal   Peds  Hematology   Anesthesia Other Findings   Reproductive/Obstetrics                             Anesthesia Physical Anesthesia Plan  ASA: III  Anesthesia Plan: Spinal   Post-op Pain Management:    Induction: Intravenous  PONV Risk Score and Plan:   Airway Management Planned: Nasal Cannula  Additional Equipment:   Intra-op Plan:   Post-operative Plan:   Informed Consent: I have reviewed the patients History and Physical, chart, labs and discussed the procedure including the risks, benefits and alternatives for the proposed anesthesia with the patient or authorized representative who has indicated his/her understanding and acceptance.     Plan Discussed with:   Anesthesia Plan Comments:         Anesthesia Quick Evaluation

## 2017-08-30 NOTE — Op Note (Signed)
08/30/2017  4:39 PM  PATIENT:  Nicholas Thompson  62 y.o. male  PRE-OPERATIVE DIAGNOSIS:  PRIMARY OSTEOARTHRITIS OF LEFT HIP  POST-OPERATIVE DIAGNOSIS:  PRIMARY OSTEOARTHRITIS OF LEFT HIP  PROCEDURE:  Procedure(s): TOTAL HIP ARTHROPLASTY ANTERIOR APPROACH (Left)  SURGEON: Laurene Footman, MD  ASSISTANTS: None  ANESTHESIA:   spinal  EBL:  Total I/O In: 800 [I.V.:800] Out: -   BLOOD ADMINISTERED:none  DRAINS: none   LOCAL MEDICATIONS USED:  MARCAINE     SPECIMEN:  Source of Specimen:  Left femoral head  DISPOSITION OF SPECIMEN:  PATHOLOGY  COUNTS:  YES  TOURNIQUET:  * No tourniquets in log *  IMPLANTS: Medacta AMIS standard 5 stem with 56 mmMpact TM cup with liner and S ceramic 28 mm head  DICTATION: .Dragon Dictation   The patient was brought to the operating room and after spinal anesthesia was obtained patient  was placed on the operative table with the ipsilateral foot into the Medacta attachment, contralateral leg on a well-padded table. C-arm was brought in and preop template x-ray taken. After prepping and draping in usual sterile fashion appropriate patient identification and timeout procedures were completed. Anterior approach to the hip was obtained and centered over the greater trochanter and TFL muscle. The subcutaneous tissue was incised hemostasis being achieved by electrocautery. TFL fascia was incised and the muscle retracted laterally deep retractor placed. The lateral femoral circumflex vessels were identified and ligated. The anterior capsule was exposed and a capsulotomy performed. The neck was identified and a femoral neck cut carried out with a saw. The head was removed without difficulty and showed sclerotic femoral head and acetabulum. Reaming was carried out to 56 mm and a 56 DM mm cup trial gave appropriate tightness to the acetabular component a 56 cm cup was impacted into position. The leg was then externally rotated and ischiofemoral and pubofemoral releases  carried out. The femur was sequentially broached to a size 5 size standard head with size 5 broach trials were placed and the final components chosen. The 5 standard stem was inserted along with a S ceramic 28 mm head and 56 mm liner. The hip was reduced and was stable the wound was thoroughly irrigated with fibrillar placed on the posterior capsulotomy and medial neck. The deep fascia was closed using a heavy Quill after infiltration of 30 cc of quarter percent Sensorcaine with epinephrine. 3-0 v-loc subcuticular l to close the skin with skin staples Xeroform and honeycomb dressing  PLAN OF CARE: Admit to inpatient

## 2017-08-30 NOTE — Transfer of Care (Signed)
Immediate Anesthesia Transfer of Care Note  Patient: Nicholas Thompson  Procedure(s) Performed: TOTAL HIP ARTHROPLASTY ANTERIOR APPROACH (Left Hip)  Patient Location: PACU  Anesthesia Type:Spinal  Level of Consciousness: awake, alert  and oriented  Airway & Oxygen Therapy: Patient Spontanous Breathing and Patient connected to face mask oxygen  Post-op Assessment: Report given to RN and Post -op Vital signs reviewed and stable  Post vital signs: Reviewed and stable  Last Vitals:  Vitals Value Taken Time  BP 120/52 08/30/2017  4:54 PM  Temp    Pulse 50 08/30/2017  4:56 PM  Resp 15 08/30/2017  4:56 PM  SpO2 100 % 08/30/2017  4:56 PM  Vitals shown include unvalidated device data.  Last Pain:  Vitals:   08/30/17 1639  TempSrc:   PainSc: 0-No pain         Complications: No apparent anesthesia complications

## 2017-08-30 NOTE — Anesthesia Post-op Follow-up Note (Signed)
Anesthesia QCDR form completed.        

## 2017-08-30 NOTE — Anesthesia Procedure Notes (Signed)
Date/Time: 08/30/2017 3:46 PM Performed by: Nelda Marseille, CRNA Pre-anesthesia Checklist: Patient identified, Emergency Drugs available, Suction available, Patient being monitored and Timeout performed Oxygen Delivery Method: Simple face mask

## 2017-08-31 ENCOUNTER — Encounter: Payer: Self-pay | Admitting: Orthopedic Surgery

## 2017-08-31 MED ORDER — MORPHINE SULFATE (PF) 4 MG/ML IV SOLN
4.0000 mg | INTRAVENOUS | Status: DC | PRN
  Administered 2017-08-31 (×4): 4 mg via INTRAVENOUS
  Filled 2017-08-31 (×4): qty 1

## 2017-08-31 MED ORDER — MORPHINE SULFATE (PF) 4 MG/ML IV SOLN: 4.0000 mg | INTRAVENOUS | Status: DC | PRN

## 2017-08-31 NOTE — Evaluation (Signed)
Physical Therapy Evaluation Patient Details Name: Nicholas Thompson MRN: [id number] DOB: 08/22/1955 Today's Date: 08/31/2017   History of Present Illness  admitted for acute hospitalization status post L THR, anterior approach, 08/29/17; WBAT.  Clinical Impression  Upon evaluation, patient alert and oriented; follows all commands and demonstrates good insight into safety needs.  L LE generally weak (2+ to 3-/5) and guarded due to significant L hip pain; requiring act assist for movement in all planes.  Pain rated 10/10, requiring encouragement, intermittent rest periods throughout session for pain management. Currently requiring mod assist for bed mobility; mod assist +2 for sit/stand, basic transfers and gait (4-5 steps) with RW.  Maintains forward flexed trunk with short, shuffling steps; limited WBing/weight acceptance L LE during all standing, mobility trials. Mild dizziness noted with transition to upright; vitals stable and WFL.  Additional distance/activity limited by pain. Would benefit from skilled PT to address above deficits and promote optimal return to PLOF; will defer formal discharge recommendation until PM when pain more controlled and full mobility assessment complete (CSW informed/aware).    Follow Up Recommendations (will defer until PM when pain more controlled and full mobility assessment complete)    Equipment Recommendations  Rolling walker with 5" wheels;3in1 (PT)    Recommendations for Other Services       Precautions / Restrictions Precautions Precautions: Anterior Hip;Fall Restrictions Weight Bearing Restrictions: Yes LLE Weight Bearing: Weight bearing as tolerated      Mobility  Bed Mobility Overal bed mobility: Needs Assistance Bed Mobility: Supine to Sit     Supine to sit: Mod assist     General bed mobility comments: assist for LE management, truncal elevation; very guarded and effortful due to pain  Transfers Overall transfer level: Needs  assistance Equipment used: Rolling walker (2 wheeled) Transfers: Sit to/from Stand Sit to Stand: Mod assist;+2 safety/equipment         General transfer comment: cuing for hand placement; assist for lift off and postural extension.  Increased time/effort required  Ambulation/Gait Ambulation/Gait assistance: Mod assist;+2 safety/equipment Ambulation Distance (Feet): 5 Feet Assistive device: Rolling walker (2 wheeled)       General Gait Details: forward flexed posture; short, shuffling steps with decreased stance time/WBing L LE; maintains L hip/knee flexion, L ankle PF throughout gait cycle.  Additional distance/activity limited by pain.  Stairs            Wheelchair Mobility    Modified Rankin (Stroke Patients Only)       Balance                                             Pertinent Vitals/Pain Pain Assessment: Faces Faces Pain Scale: Hurts whole lot Pain Location: L hip Pain Descriptors / Indicators: Aching;Grimacing Pain Intervention(s): Limited activity within patient's tolerance;Monitored during session;Premedicated before session;Repositioned    Home Living Family/patient expects to be discharged to:: Private residence Living Arrangements: Spouse/significant other Available Help at Discharge: Family;Available 24 hours/day Type of Home: House Home Access: Stairs to enter   [employer] of Steps: 3 Home Layout: One level Home Equipment: Walker - 2 wheels;Cane - single point      Prior Function Level of Independence: Independent with assistive device(s)         Comments: Mod indep with intermittent use of RW, SPC (due to L hip pain) for ADLs, household and community distances; does  endorse single fall within previous six months.     Hand Dominance   Dominant Hand: Right    Extremity/Trunk Assessment   Upper Extremity Assessment Upper Extremity Assessment: (P) Overall WFL for tasks assessed    Lower Extremity  Assessment Lower Extremity Assessment: (P) (L hip grossly 2+ to 3-/5, generally limited by pain, knee and ankle at least 4-/5; R LE grossly WFL)       Communication   Communication: No difficulties  Cognition Arousal/Alertness: Awake/alert Behavior During Therapy: WFL for tasks assessed/performed Overall Cognitive Status: Within Functional Limits for tasks assessed                                        General Comments      Exercises Other Exercises Other Exercises: Supine L LE therex, 1x10, act assist ROM for muscular strength/flexibility: ankle pumps, quad sets, SAQs, heel slides, hip abduct/adduct.  Increase time/effort required; very guarded due to pain.   Assessment/Plan    PT Assessment Patient needs continued PT services  PT Problem List Decreased strength;Decreased range of motion;Decreased activity tolerance;Decreased balance;Decreased mobility;Decreased coordination;Decreased cognition;Decreased knowledge of use of DME;Decreased safety awareness;Decreased knowledge of precautions;Pain;Decreased skin integrity       PT Treatment Interventions DME instruction;Gait training;Stair training;Functional mobility training;Therapeutic activities;Therapeutic exercise;Balance training;Patient/family education    PT Goals (Current goals can be found in the Care Plan section)  Acute Rehab PT Goals Patient Stated Goal: to try to get up  PT Goal Formulation: With patient/family Time For Goal Achievement: 09/14/17 Potential to Achieve Goals: Good    Frequency BID   Barriers to discharge Decreased caregiver support      Co-evaluation               AM-PAC PT "6 Clicks" Daily Activity  Outcome Measure Difficulty turning over in bed (including adjusting bedclothes, sheets and blankets)?: Unable Difficulty moving from lying on back to sitting on the side of the bed? : Unable Difficulty sitting down on and standing up from a chair with arms (e.g.,  wheelchair, bedside commode, etc,.)?: Unable Help needed moving to and from a bed to chair (including a wheelchair)?: A Lot Help needed walking in hospital room?: A Lot Help needed climbing 3-5 steps with a railing? : A Lot 6 Click Score: 9    End of Session Equipment Utilized During Treatment: Gait belt Activity Tolerance: Patient limited by pain Patient left: in chair;with call bell/phone within reach;with chair alarm set Nurse Communication: Mobility status PT Visit Diagnosis: Difficulty in walking, not elsewhere classified (R26.2);Muscle weakness (generalized) (M62.81);Pain Pain - Right/Left: Left Pain - part of body: Hip    Time: 2992-4268 PT Time Calculation (min) (ACUTE ONLY): 50 min   Charges:   PT Evaluation $PT Eval Moderate Complexity: 1 Mod PT Treatments $Therapeutic Exercise: 8-22 mins $Therapeutic Activity: 8-22 mins   PT G Codes:        Evaluation/treatment session was lead and completed by Roxan Hockey, SPT; directly observed, guided and documented by Tera Helper, PT.  Reyes Ivan. Owens Shark, PT, DPT, NCS 08/31/17, 10:07 AM [phone]

## 2017-08-31 NOTE — Progress Notes (Signed)
   Subjective: 1 Day Post-Op Procedure(s) (LRB): TOTAL HIP ARTHROPLASTY ANTERIOR APPROACH (Left) Patient reports pain as 10 on 0-10 scale.   Currently standing up with PT, dizziness occurred with sitting up but improved  We will continue therapy today.    Objective: Vital signs in last 24 hours: Temp:  [97.4 F (36.3 C)-99.1 F (37.3 C)] 99.1 F (37.3 C) (06/12 0744) Pulse Rate:  [45-81] 64 (06/12 0744) Resp:  [11-21] 15 (06/12 0744) BP: (98-180)/(52-93) 152/78 (06/12 0744) SpO2:  [93 %-100 %] 98 % (06/12 0744) Weight:  [111.1 kg (245 lb)] 111.1 kg (245 lb) (06/11 1136)  Intake/Output from previous day: 06/11 0701 - 06/12 0700 In: 800 [I.V.:800] Out: 1200 [Urine:950; Blood:250] Intake/Output this shift: No intake/output data recorded.  Recent Labs    08/30/17 1921  HGB 14.4   Recent Labs    08/30/17 1921  WBC 18.5*  RBC 4.66  HCT 41.9  PLT 193   Recent Labs    08/30/17 1921  CREATININE 0.89   No results for input(s): LABPT, INR in the last 72 hours.  EXAM General - Patient is Alert, Appropriate and Oriented Extremity - Neurovascular intact Dorsiflexion/Plantar flexion intact No cellulitis present Compartment soft Dressing - dressing C/D/I and no drainage Motor Function - intact, moving foot and toes well on exam.   Past Medical History:  Diagnosis Date  . Arthritis   . Basal cell carcinoma    bASAL CELL  . Chronic kidney disease   . GERD (gastroesophageal reflux disease)   . History of colonic polyps   . History of kidney stones   . Hyperlipidemia   . Nephrolithiasis   . Other and unspecified hyperlipidemia   . PONV (postoperative nausea and vomiting)   . Renal cell carcinoma of right kidney ([place]) 07/30/2015  . S/p nephrectomy 07/30/2015  . Unspecified essential hypertension     Assessment/Plan:   1 Day Post-Op Procedure(s) (LRB): TOTAL HIP ARTHROPLASTY ANTERIOR APPROACH (Left) Active Problems:   Status post total hip replacement,  left  Estimated body mass index is 31.46 kg/m as calculated from the following:   Height as of this encounter: 6\' 2"  (1.88 m).   Weight as of this encounter: 111.1 kg (245 lb). Advance diet Up with therapy  Needs BM Pain severe, will continue to monitor.  Patient had been without oxycodone for over 8 hours.  Patient had just received oxycodone 40 minutes prior to my visit. Labs are stable, recheck in the morning Care management to assist with discharge.   DVT Prophylaxis - Lovenox, Foot Pumps and TED hose Weight-Bearing as tolerated to left leg   T. Rachelle Hora, PA-C [place] 08/31/2017, 9:24 AM

## 2017-08-31 NOTE — Clinical Social Work Note (Addendum)
Clinical Social Work Assessment  Patient Details  Name: Nicholas Thompson MRN: [id number] Date of Birth: May 06, 1955  Date of referral:  08/31/17               Reason for consult:  Facility Placement                Permission sought to share information with:  Family Supports, Customer service manager Permission granted to share information::  Yes, Verbal Permission Granted  Name::     [place], [place] [phone]  [phone]   Agency::  SNF admissions  Relationship::     Contact Information:     Housing/Transportation Living arrangements for the past 2 months:  Single Family Home Source of Information:  Patient Patient Interpreter Needed:  None Criminal Activity/Legal Involvement Pertinent to Current Situation/Hospitalization:  No - Comment as needed Significant Relationships:  Adult Children, Spouse Lives with:  Spouse Do you feel safe going back to the place where you live?  No Need for family participation in patient care:  No (Coment)  Care giving concerns: Patient and family feel he needs some short term rehab before he is able to return back home.   Social Worker assessment / plan:  Patient is a 62 year old male who is alert and oriented x4.  Patient is married and lives with his wife, patient states he has not been to a SNF for rehab before, CSW explained to patient and his husband what to expect at [facility] and the process for looking for placement.  CSW explained how insurance will pay for stay, and also the difference between home health and going to a SNF for short term rehab.  Patient and his wife would prefer that he goes home with home health instead, CSW reminded patient and wife, it is their choice of going home or going to SNF.  CSW explained role of CSW and what to expect day of discharge.  Patient and family gave CSW permission to begin bed search in [facility] and did not express any other questions or concerns.  Employment status:  English as a second language teacher:  Managed Care PT Recommendations:  [place] / Referral to community resources:  [place]  Patient/Family's Response to care:  Patient and family are agreeable to going to SNF.  Patient/Family's Understanding of and Emotional Response to Diagnosis, Current Treatment, and Prognosis:  Patient and family are hopeful that patient will not have to go to SNF and if he does they hope he will only have to be there a short period of time.  Emotional Assessment Appearance:  Appears stated age Attitude/Demeanor/Rapport:    Affect (typically observed):  Appropriate, Pleasant, Accepting, Stable Orientation:  Oriented to Self, Oriented to Place, Oriented to  Time, Oriented to Situation Alcohol / Substance use:  Not Applicable Psych involvement (Current and /or in the community):     Discharge Needs  Concerns to be addressed:  Care Coordination, Lack of Support Readmission within the last 30 days:  No Current discharge risk:  Lack of support system Barriers to Discharge:  Continued Medical Work up, [employer]   Anell Barr 08/31/2017, 5:32 PM

## 2017-08-31 NOTE — Evaluation (Signed)
Occupational Therapy Evaluation Patient Details Name: Nicholas Thompson MRN: [id number] DOB: 1955/07/16 Today's Date: 08/31/2017    History of Present Illness 62yo male pt admitted for acute hospitalization status post L THR, anterior approach, 08/29/17; WBAT.   Clinical Impression   Pt seen for OT evaluation this date, POD#1 from above surgery. Pt was independent in all ADLs prior to surgery, however occasionally using a SPC vs RW for mobility due to L hip pain. Pt is eager to return to PLOF with less pain and improved safety and independence. Pt currently requires min-mod assist for LB dressing and bathing while in seated position due to pain and limited AROM of L hip. Pt noting sharp/shooting pain in L hip. Requested OT notify RN. RN notified. Pt/spouse/family instructed in self care skills, falls prevention strategies, home/routines modifications, DME/AE for LB bathing and dressing tasks, compression stocking mgt strategies, and pet care considerations. Mobility deferred 2:2 pain and fatigue. Will continue to assess functional independence next session. Pt would benefit from additional instruction in self care skills and techniques to help maintain precautions with or without assistive devices to support recall and carryover prior to discharge. Recommend STR upon discharge based on current progress. Anticipate improved functional independence with improved pain control.        Follow Up Recommendations  SNF    Equipment Recommendations  None recommended by OT    Recommendations for Other Services       Precautions / Restrictions Precautions Precautions: Anterior Hip;Fall Restrictions Weight Bearing Restrictions: Yes LLE Weight Bearing: Weight bearing as tolerated      Mobility Bed Mobility   General bed mobility comments: deferred, pt pain limited this session, will assess tomorrow  Transfers         General transfer comment: deferred, pt pain limited this session, will assess  tomorrow    Balance                             ADL either performed or assessed with clinical judgement   ADL Overall ADL's : Needs assistance/impaired Eating/Feeding: Independent;Sitting   Grooming: Min guard;Standing   Upper Body Bathing: Sitting;Modified independent   Lower Body Bathing: Minimal assistance;Moderate assistance;Sitting/lateral leans Lower Body Bathing Details (indicate cue type and reason): pt/family/spouse instructed in seated shower once cleared to shower by surgeon Upper Body Dressing : Sitting;Independent   Lower Body Dressing: Moderate assistance;Minimal assistance;Sitting/lateral leans Lower Body Dressing Details (indicate cue type and reason): pt/family/spouse instructed in AE for LB dressing and compression stocking mgt strategies Toilet Transfer: Minimal assistance;+2 for physical assistance;RW;Ambulation;BSC Toilet Transfer Details (indicate cue type and reason): instructed to use urinal at night beside bed initially, BSC frame over standard commode at home to improve safety         Functional mobility during ADLs: Minimal assistance;+2 for physical assistance;Rolling walker       Vision Baseline Vision/History: Wears glasses Wears Glasses: At all times Patient Visual Report: No change from baseline       Perception     Praxis      Pertinent Vitals/Pain Pain Assessment: 0-10 Pain Score: 6  Pain Location: L hip Pain Descriptors / Indicators: Aching;Grimacing;Sharp;Shooting Pain Intervention(s): Limited activity within patient's tolerance;Patient requesting pain meds-RN notified;Monitored during session     Hand Dominance Right   Extremity/Trunk Assessment Upper Extremity Assessment Upper Extremity Assessment: Overall WFL for tasks assessed   Lower Extremity Assessment Lower Extremity Assessment: Defer to PT evaluation(pain limited, post-op strength  deficits in LLE, RLE WFL)   Cervical / Trunk Assessment Cervical / Trunk  Assessment: Normal   Communication Communication Communication: No difficulties   Cognition Arousal/Alertness: Awake/alert Behavior During Therapy: WFL for tasks assessed/performed Overall Cognitive Status: Within Functional Limits for tasks assessed                                     General Comments       Exercises Other Exercises Other Exercises: Pt/spouse/family educated in falls prevention strategies including pet care strategies for their dog Other Exercises: Pt/spouse/family educated in compression stocking mgt, DME/AE for bathing/dressing - pt will benefit from additional instruction to support recall and carryover   Shoulder Instructions      Home Living Family/patient expects to be discharged to:: Private residence Living Arrangements: Spouse/significant other Available Help at Discharge: Family;Available 24 hours/day Type of Home: House Home Access: Stairs to enter [employer] of Steps: 3   Home Layout: One level     Bathroom Shower/Tub: Teacher, early years/pre: Standard     Home Equipment: Environmental consultant - 2 wheels;Cane - single point;Bedside commode;Shower seat;Adaptive equipment Adaptive Equipment: Reacher        Prior Functioning/Environment Level of Independence: Independent with assistive device(s)        Comments: Mod indep with intermittent use of RW, SPC (due to L hip pain) for ADLs, household and community distances; does endorse single fall within previous six months. 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)        OT Problem List: Decreased strength;Decreased knowledge of use of DME or AE;Pain      OT Treatment/Interventions: Self-care/ADL training;Therapeutic exercise;Therapeutic activities;DME and/or AE instruction;Patient/family education    OT Goals(Current goals can be found in the care plan section) Acute Rehab OT Goals Patient Stated Goal: have less pain and get moving again OT Goal  Formulation: With patient/family Time For Goal Achievement: 09/14/17 Potential to Achieve Goals: Good ADL Goals Pt Will Perform Lower Body Dressing: sit to/from stand;with adaptive equipment;with modified independence Pt Will Transfer to Toilet: with min guard assist;ambulating(BSC frame over toilet, LRAD for ambulation) Additional ADL Goal #1: Pt will independently instruct family in donning/doffing compression stockings.  OT Frequency: Min 1X/week   Barriers to D/C:            Co-evaluation              AM-PAC PT "6 Clicks" Daily Activity     Outcome Measure Help from another person eating meals?: None Help from another person taking care of personal grooming?: None Help from another person toileting, which includes using toliet, bedpan, or urinal?: A Lot Help from another person bathing (including washing, rinsing, drying)?: A Lot Help from another person to put on and taking off regular upper body clothing?: None Help from another person to put on and taking off regular lower body clothing?: A Lot 6 Click Score: 18   End of Session    Activity Tolerance: Patient limited by pain Patient left: in bed;with call bell/phone within reach;with bed alarm set;with family/visitor present;with SCD's reapplied  OT Visit Diagnosis: Other abnormalities of gait and mobility (R26.89);Pain Pain - Right/Left: Left Pain - part of body: Hip                Time: 6270-3500 OT Time Calculation (min): 12 min Charges:  OT General Charges $OT Visit:  1 Visit OT Evaluation $OT Eval Moderate Complexity: 1 Mod  Jeni Salles, MPH, MS, OTR/L ascom [phone] 08/31/17, 4:10 PM

## 2017-08-31 NOTE — Progress Notes (Signed)
Physical Therapy Treatment Patient Details Name: Nicholas Thompson MRN: [id number] DOB: 10/13/1955 Today's Date: 08/31/2017    History of Present Illness admitted for acute hospitalization status post L THR, anterior approach, 08/29/17; WBAT.    PT Comments    Progressive increase in gait distance, though very slow/effortful requiring increased time and encouragement.  Limited stance time/WBing L LE; very heavy WBing bilat UEs.  Additional distance/mobility limited by fatigue. Anticipate continued improvement with additional pain control.   Follow Up Recommendations  SNF     Equipment Recommendations       Recommendations for Other Services       Precautions / Restrictions Precautions Precautions: Anterior Hip;Fall Restrictions Weight Bearing Restrictions: Yes LLE Weight Bearing: Weight bearing as tolerated    Mobility  Bed Mobility Overal bed mobility: Needs Assistance Bed Mobility: Sit to Supine       Sit to supine: Max assist   General bed mobility comments: total assist for L LE elevation over edge of bed  Transfers Overall transfer level: Needs assistance Equipment used: Rolling walker (2 wheeled) Transfers: Sit to/from Stand Sit to Stand: Min assist;+2 safety/equipment         General transfer comment: significan assist from UEs; increased time, effort required.  Mild rotation with excessive WBing R LE  Ambulation/Gait Ambulation/Gait assistance: Min assist;+2 safety/equipment Ambulation Distance (Feet): 40 Feet Assistive device: Rolling walker (2 wheeled)       General Gait Details: very short, shuffling stepping pattern; tends to slide L LE over floor vs step.  Constant cuing for postural extension, walker position and step height/length; encouraged for increased cadence and continuous stepping pattern to maximize efficiency.  Additional distance limited by fatigue.   Stairs             Wheelchair Mobility    Modified Rankin (Stroke Patients  Only)       Balance Overall balance assessment: Needs assistance Sitting-balance support: No upper extremity supported;Feet supported Sitting balance-[name]: Good     Standing balance support: Bilateral upper extremity supported Standing balance-[name]: Fair                              Cognition Arousal/Alertness: Awake/alert Behavior During Therapy: WFL for tasks assessed/performed Overall Cognitive Status: Within Functional Limits for tasks assessed                                        Exercises Other Exercises Other Exercises: Sit/stand x3 with RW, min/mod assist +1-significant weight shift towards R, heavy use of UEs to complete; maintains L hip/knee in mild flexion with all standing activities-constant cuing for hip/knee extension and foot flat as tolerated    General Comments        Pertinent Vitals/Pain Pain Assessment: Faces Pain Score: 6  Pain Location: L hip Pain Descriptors / Indicators: Aching;Grimacing Pain Intervention(s): Limited activity within patient's tolerance;Monitored during session;Repositioned    Home Living                      Prior Function            PT Goals (current goals can now be found in the care plan section) Acute Rehab PT Goals Patient Stated Goal: to try to get up  PT Goal Formulation: With patient/family Time For Goal Achievement: 09/14/17 Potential to Achieve  Goals: Good    Frequency    BID      PT Plan      Co-evaluation              AM-PAC PT "6 Clicks" Daily Activity  Outcome Measure  Difficulty turning over in bed (including adjusting bedclothes, sheets and blankets)?: Unable Difficulty moving from lying on back to sitting on the side of the bed? : Unable Difficulty sitting down on and standing up from a chair with arms (e.g., wheelchair, bedside commode, etc,.)?: Unable Help needed moving to and from a bed to chair (including a wheelchair)?: A  Lot Help needed walking in hospital room?: A Lot Help needed climbing 3-5 steps with a railing? : A Lot 6 Click Score: 9    End of Session Equipment Utilized During Treatment: Gait belt Activity Tolerance: Patient tolerated treatment well Patient left: in bed;with call bell/phone within reach;with bed alarm set;with family/visitor present Nurse Communication: Mobility status PT Visit Diagnosis: Difficulty in walking, not elsewhere classified (R26.2);Muscle weakness (generalized) (M62.81);Pain Pain - Right/Left: Left Pain - part of body: Hip     Time: 1401-1436 PT Time Calculation (min) (ACUTE ONLY): 35 min  Charges:  $Gait Training: 8-22 mins $Therapeutic Activity: 8-22 mins                    G Codes:      Evaluation/treatment session was lead and completed by Roxan Hockey, SPT; directly observed, guided and documented by Tera Helper, [place]. Owens Shark, PT, DPT, NCS 08/31/17, 3:46 PM [phone]

## 2017-08-31 NOTE — Clinical Social Work Note (Signed)
CSW received consult that patient will need short term rehab at [facility].  CSW met with patient and his wife to discuss SNF placement and process.  CSW spoke to patient and his wife they gave CSW permission to begin bed search in [place].  CSW explained to them what to expect at SNF.  CSW to continue to follow patient's progress throughout discharge planning, formal assessment to follow.   Jones Broom. [place], MSW, [place]  08/31/2017 3:41 PM

## 2017-08-31 NOTE — Progress Notes (Addendum)
Pt complaining of pain. Ordered prn pain meds given as ordered. Pt still complaining of pain. Upset about his unrelieved pain. Reported that the only pain meds that works for him is morphine. Dr. Posey Pronto made aware. Order for morphine 4mg  every 2 hours and D/c dilaudid obtained.

## 2017-08-31 NOTE — Progress Notes (Signed)
OT Cancellation Note  Patient Details Name: Nicholas Thompson MRN: [id number] DOB: [birth year]   Cancelled Treatment:    Reason Eval/Treat Not Completed: Other (comment). Order received, chart reviewed. Pt working with PT upon attempt. Will re-attempt OT evaluation at later date/time as pt is available and medically appropriate.   Jeni Salles, MPH, MS, OTR/L ascom [phone] 08/31/17, 2:07 PM

## 2017-08-31 NOTE — NC FL2 (Signed)
[place] LEVEL OF CARE SCREENING TOOL     IDENTIFICATION  Patient Name: Nicholas Thompson Birthdate: February 04, 1956 Sex: male Admission Date (Current Location): 08/30/2017  [place] and [place] Number:  Engineering geologist and Address:  [facility], [address], [place], [place] [address]      Provider Number: [id number]  Attending Physician Name and Address:  Hessie Knows, MD  Relative Name and Phone Number:  Jhase, [place] [phone]  [phone]     Current Level of Care: Hospital Recommended Level of Care: [place] Prior Approval Number:    Date Approved/Denied:   PASRR Number: [id number]  Discharge Plan: SNF    Current Diagnoses: Patient Active Problem List   Diagnosis Date Noted  . Status post total hip replacement, left 08/30/2017  . Left hip pain 05/18/2017  . Renal cell carcinoma of right kidney ([place]) 07/30/2015  . S/p RIGHT nephrectomy 07/30/2015  . COLONIC POLYPS, HX OF 08/07/2008  . HYPERLIPIDEMIA 05/18/2007  . HYPERTENSION 05/18/2007  . HYPERGLYCEMIA 05/18/2007  . CARCINOMA, BASAL CELL, HX OF 05/18/2007    Orientation RESPIRATION BLADDER Height & Weight     Self, Situation, Place, Time  Normal Continent Weight: 245 lb (111.1 kg) Height:  6\' 2"  (188 cm)  BEHAVIORAL SYMPTOMS/MOOD NEUROLOGICAL BOWEL NUTRITION STATUS      Continent Diet(Regular diet)  AMBULATORY STATUS COMMUNICATION OF NEEDS Skin   Limited Assist Verbally Surgical wounds                       Personal Care Assistance Level of Assistance  Feeding, Bathing, Dressing Bathing Assistance: Limited assistance Feeding assistance: Independent Dressing Assistance: Limited assistance     Functional Limitations Info  Sight, Hearing, Speech Sight Info: Adequate Hearing Info: Adequate Speech Info: Adequate    SPECIAL CARE FACTORS FREQUENCY  PT (By licensed PT), OT (By licensed OT)     PT Frequency: 5x a week OT  Frequency: 5x a week            Contractures Contractures Info: Not present    Additional Factors Info  Code Status, Allergies, Psychotropic Code Status Info: Full Code Allergies Info: VICODIN HYDROCODONE-ACETAMINOPHEN  Psychotropic Info: traMADol (ULTRAM) tablet 50 mg          Current Medications (08/31/2017):  This is the current hospital active medication list Current Facility-Administered Medications  Medication Dose Route Frequency Provider Last Rate Last Dose  . 0.9 %  sodium chloride infusion   Intravenous Continuous Hessie Knows, MD 100 mL/hr at 08/31/17 0535    . acetaminophen (TYLENOL) tablet 1,000 mg  1,000 mg Oral Q6H Hessie Knows, MD   1,000 mg at 08/31/17 1132  . alum & mag hydroxide-simeth (MAALOX/MYLANTA) 200-200-20 MG/5ML suspension 30 mL  30 mL Oral Q4H PRN Hessie Knows, MD      . bisacodyl (DULCOLAX) EC tablet 5 mg  5 mg Oral Daily PRN Hessie Knows, MD      . diphenhydrAMINE (BENADRYL) 12.5 MG/5ML elixir 12.5-25 mg  12.5-25 mg Oral Q4H PRN Hessie Knows, MD      . docusate sodium (COLACE) capsule 100 mg  100 mg Oral BID Hessie Knows, MD   100 mg at 08/31/17 0947  . enalapril (VASOTEC) tablet 40 mg  40 mg Oral Daily Hessie Knows, MD   40 mg at 08/31/17 0948  . enoxaparin (LOVENOX) injection 40 mg  40 mg Subcutaneous Q24H Hessie Knows, MD   40 mg at 08/31/17 0745  .  ketoconazole (NIZORAL) 2 % cream 1 application  1 application Topical Daily PRN Hessie Knows, MD      . magnesium citrate solution 1 Bottle  1 Bottle Oral Once PRN Hessie Knows, MD      . Melatonin TABS 10 mg  10 mg Oral QHS PRN Hessie Knows, MD      . menthol-cetylpyridinium (CEPACOL) lozenge 3 mg  1 lozenge Oral PRN Hessie Knows, MD       Or  . phenol (CHLORASEPTIC) mouth spray 1 spray  1 spray Mouth/Throat PRN Hessie Knows, MD      . methocarbamol (ROBAXIN) tablet 500 mg  500 mg Oral Q6H PRN Hessie Knows, MD   500 mg at 08/31/17 1542   Or  . methocarbamol (ROBAXIN) 500 mg in dextrose 5  % 50 mL IVPB  500 mg Intravenous Q6H PRN Hessie Knows, MD      . metoCLOPramide (REGLAN) tablet 5-10 mg  5-10 mg Oral Q8H PRN Hessie Knows, MD       Or  . metoCLOPramide (REGLAN) injection 5-10 mg  5-10 mg Intravenous Q8H PRN Hessie Knows, MD      . morphine 4 MG/ML injection 4 mg  4 mg Intravenous Q2H PRN Leim Fabry, MD      . ondansetron [facility]) tablet 4 mg  4 mg Oral Q6H PRN Hessie Knows, MD       Or  . ondansetron [facility]) injection 4 mg  4 mg Intravenous Q6H PRN Hessie Knows, MD      . oxyCODONE (Oxy IR/ROXICODONE) immediate release tablet 10-15 mg  10-15 mg Oral Q4H PRN Hessie Knows, MD   15 mg at 08/31/17 0019  . oxyCODONE (Oxy IR/ROXICODONE) immediate release tablet 5-10 mg  5-10 mg Oral Q4H PRN Hessie Knows, MD   10 mg at 08/31/17 1245  . pantoprazole (PROTONIX) EC tablet 40 mg  40 mg Oral QAC breakfast Hessie Knows, MD   40 mg at 08/31/17 0745  . senna-docusate (Senokot-S) tablet 1 tablet  1 tablet Oral QHS PRN Hessie Knows, MD      . traMADol [facility] [place]) tablet 50 mg  50 mg Oral Q6H Hessie Knows, MD      . zolpidem (AMBIEN) tablet 5 mg  5 mg Oral QHS PRN,MR X 1 Hessie Knows, MD         Discharge Medications: Please see discharge summary for a list of discharge medications.  Relevant Imaging Results:  Relevant Lab Results:   Additional Information SSN [id number]  Ross Ludwig, [place]

## 2017-08-31 NOTE — Anesthesia Postprocedure Evaluation (Signed)
Anesthesia Post Note  Patient: Nicholas Thompson  Procedure(s) Performed: TOTAL HIP ARTHROPLASTY ANTERIOR APPROACH (Left Hip)  Patient location during evaluation: Nursing Unit Anesthesia Type: Spinal Level of consciousness: awake and alert and oriented Pain management: satisfactory to patient Vital Signs Assessment: post-procedure vital signs reviewed and stable Respiratory status: respiratory function stable Cardiovascular status: stable Postop Assessment: no headache, no backache, spinal receding, patient able to bend at knees, no apparent nausea or vomiting, adequate PO intake and able to ambulate Anesthetic complications: no     Last Vitals:  Vitals:   08/30/17 2333 08/31/17 0401  BP: (!) 149/68 (!) 151/72  Pulse: 81 62  Resp:  19  Temp: 36.9 C 36.8 C  SpO2: 95% 100%    Last Pain:  Vitals:   08/31/17 0601  TempSrc:   PainSc: Ambrose Mantle

## 2017-09-01 LAB — URINALYSIS, COMPLETE (UACMP) WITH MICROSCOPIC
BACTERIA UA: NONE SEEN
Bilirubin Urine: NEGATIVE
Glucose, UA: NEGATIVE mg/dL
Hgb urine dipstick: NEGATIVE
Ketones, ur: NEGATIVE mg/dL
Leukocytes, UA: NEGATIVE
Nitrite: NEGATIVE
PROTEIN: NEGATIVE mg/dL
Specific Gravity, Urine: 1.017 (ref 1.005–1.030)
pH: 5 (ref 5.0–8.0)

## 2017-09-01 LAB — BASIC METABOLIC PANEL
ANION GAP: 12 (ref 5–15)
Anion gap: 6 (ref 5–15)
BUN: 19 mg/dL (ref 6–20)
BUN: 23 mg/dL — ABNORMAL HIGH (ref 6–20)
CHLORIDE: 106 mmol/L (ref 101–111)
CO2: 24 mmol/L (ref 22–32)
CO2: 22 mmol/L (ref 22–32)
Calcium: 8.7 mg/dL — ABNORMAL LOW (ref 8.9–10.3)
Calcium: 8.9 mg/dL (ref 8.9–10.3)
Chloride: 101 mmol/L (ref 101–111)
Creatinine, Ser: 0.95 mg/dL (ref 0.61–1.24)
Creatinine, Ser: 1.09 mg/dL (ref 0.61–1.24)
GFR calc Af Amer: >60 mL/min (ref >60)
GFR calc non Af Amer: >60 mL/min (ref >60)
Glucose, Bld: 123 mg/dL — ABNORMAL HIGH (ref 65–99)
Glucose, Bld: 145 mg/dL — ABNORMAL HIGH (ref 65–99)
POTASSIUM: 4 mmol/L (ref 3.5–5.1)
POTASSIUM: 3.8 mmol/L (ref 3.5–5.1)
Sodium: 136 mmol/L (ref 135–145)
Sodium: 135 mmol/L (ref 135–145)

## 2017-09-01 LAB — CBC
HCT: 39.5 % — ABNORMAL LOW (ref 40.0–52.0)
HEMOGLOBIN: 13.9 g/dL (ref 13.0–18.0)
MCH: 31.8 pg (ref 26.0–34.0)
MCHC: 35.2 g/dL (ref 32.0–36.0)
MCV: 90.5 fL (ref 80.0–100.0)
Platelets: 200 10*3/uL (ref 150–440)
RBC: 4.36 MIL/uL — AB (ref 4.40–5.90)
RDW: 12.9 % (ref 11.5–14.5)
WBC: 12.6 10*3/uL — AB (ref 3.8–10.6)

## 2017-09-01 LAB — SURGICAL PATHOLOGY

## 2017-09-01 MED ORDER — ACETAMINOPHEN 325 MG PO TABS
650.0000 mg | ORAL_TABLET | Freq: Four times a day (QID) | ORAL | Status: DC
  Administered 2017-09-01 – 2017-09-02 (×5): 650 mg via ORAL
  Filled 2017-09-01 (×5): qty 2

## 2017-09-01 MED ORDER — OXYCODONE HCL 5 MG PO TABS: 5.0000 mg | ORAL_TABLET | ORAL | 0 refills | Status: DC | PRN

## 2017-09-01 MED ORDER — DOCUSATE SODIUM 100 MG PO CAPS: 100.0000 mg | ORAL_CAPSULE | Freq: Two times a day (BID) | ORAL | 0 refills | Status: DC

## 2017-09-01 MED ORDER — ENOXAPARIN SODIUM 40 MG/0.4ML ~~LOC~~ SOLN: 40.0000 mg | SUBCUTANEOUS | 0 refills | Status: DC

## 2017-09-01 MED ORDER — TRAMADOL HCL 50 MG PO TABS
50.0000 mg | ORAL_TABLET | Freq: Four times a day (QID) | ORAL | Status: DC
  Administered 2017-09-02 – 2017-09-03 (×3): 50 mg via ORAL
  Filled 2017-09-01 (×4): qty 1

## 2017-09-01 NOTE — Progress Notes (Signed)
Occupational Therapy Treatment Patient Details Name: Nicholas Thompson MRN: [id number] DOB: June 24, 1955 Today's Date: 09/01/2017    History of present illness 62yo male pt admitted for acute hospitalization status post L THR, anterior approach, 08/29/17; WBAT.   OT comments  Pt seen for OT tx this afternoon. Daughter present. Pt reports continued nausea and 5/10 R hip pain, states, "I'm waiting for my nurse to help get me back to bed." Pt/dtr educated in benefits of sitting up in recliner and encouraged pt to sit up through dinner prior to returning to bed. Pt agreeable, daughter very supportive and encouraging. Pt requesting head of recliner be tilted back, OT performed. Requested BLE be elevated with leg rest, required extensive time to complete 2:2 pain. Pt/dtr educated in [place] for LB ADL tasks as well as cognitive behavioral strategies for pain coping/mgt. Pt/dtr verbalized understanding. Pt limited by pain and nausea this date. Per pt and RN report, working on improved pain mgt via medication changes. Pt continues to benefit from skilled OT Services to address noted impairments and functional deficits. Continue to recommend STR 2:2 pt's lack of progress due to pain. Will continue to progress.   Follow Up Recommendations  SNF    Equipment Recommendations  Other (comment);3 in 1 bedside commode(reacher)    Recommendations for Other Services      Precautions / Restrictions Precautions Precautions: Anterior Hip;Fall Restrictions Weight Bearing Restrictions: Yes LLE Weight Bearing: Weight bearing as tolerated       Mobility Bed Mobility               General bed mobility comments: deferred, up in recliner  Transfers                 General transfer comment: deferred 2:2 pain/nausea    Balance                                           ADL either performed or assessed with clinical judgement   ADL Overall ADL's : Needs assistance/impaired                       Lower Body Dressing Details (indicate cue type and reason): Pt and daughter (not present previous date) educated in AE to support recall and carryover                     Vision Baseline Vision/History: Wears glasses Wears Glasses: At all times Patient Visual Report: No change from baseline     Perception     Praxis      Cognition Arousal/Alertness: Awake/alert Behavior During Therapy: WFL for tasks assessed/performed Overall Cognitive Status: Within Functional Limits for tasks assessed                                          Exercises Other Exercises Other Exercises: Pt and dtr educated in cognitive behavioral pain coping strategies including pleasant imagery, distraction, and progressive muscle relaxation as well as deep breathing exercises. Both verbalized understanding but pt would benefit from additional training to support recall and carryover   Shoulder Instructions       General Comments      Pertinent Vitals/ Pain       Pain Assessment: 0-10 Pain Score: 5  Pain Location: L hip Pain Descriptors / Indicators: Aching;Grimacing;Sharp;Shooting Pain Intervention(s): Limited activity within patient's tolerance;Monitored during session;Premedicated before session  Home Living                                          Prior Functioning/Environment              Frequency  Min 1X/week        Progress Toward Goals  OT Goals(current goals can now be found in the care plan section)  Progress towards OT goals: Not progressing toward goals - comment(2:2 pain and nausea)  Acute Rehab OT Goals Patient Stated Goal: have less pain and get moving again OT Goal Formulation: With patient/family Time For Goal Achievement: 09/14/17 Potential to Achieve Goals: Good  Plan Discharge plan remains appropriate;Frequency remains appropriate    Co-evaluation                 AM-PAC PT "6 Clicks" Daily  Activity     Outcome Measure   Help from another person eating meals?: None Help from another person taking care of personal grooming?: None Help from another person toileting, which includes using toliet, bedpan, or urinal?: A Lot Help from another person bathing (including washing, rinsing, drying)?: A Lot Help from another person to put on and taking off regular upper body clothing?: None Help from another person to put on and taking off regular lower body clothing?: A Lot 6 Click Score: 18    End of Session    OT Visit Diagnosis: Other abnormalities of gait and mobility (R26.89);Pain Pain - Right/Left: Left Pain - part of body: Hip   Activity Tolerance Patient limited by pain   Patient Left in chair;with call bell/phone within reach;with chair alarm set;with family/visitor present   Nurse Communication          Time: 7124-5809 OT Time Calculation (min): 17 min  Charges: OT General Charges $OT Visit: 1 Visit OT Treatments $Self Care/Home Management : 8-22 mins  Jeni Salles, MPH, MS, OTR/L ascom [phone] 09/01/17, 4:30 PM

## 2017-09-01 NOTE — Plan of Care (Signed)
  Problem: Education: Goal: Knowledge of General Education information will improve Outcome: Progressing   Problem: Health Behavior/Discharge Planning: Goal: Ability to manage health-related needs will improve Outcome: Progressing   Problem: Clinical Measurements: Goal: Ability to maintain clinical measurements within normal limits will improve Outcome: Progressing Goal: Will remain free from infection Outcome: Progressing Goal: Diagnostic test results will improve Outcome: Progressing Goal: Respiratory complications will improve Outcome: Progressing Goal: Cardiovascular complication will be avoided Outcome: Progressing   Problem: Activity: Goal: Risk for activity intolerance will decrease Outcome: Progressing   Problem: Nutrition: Goal: Adequate nutrition will be maintained Outcome: Progressing   Problem: Coping: Goal: Level of anxiety will decrease Outcome: Progressing   Problem: Elimination: Goal: Will not experience complications related to bowel motility Outcome: Progressing Goal: Will not experience complications related to urinary retention Outcome: Progressing   Problem: Pain Managment: Goal: General experience of comfort will improve Outcome: Progressing   Problem: Safety: Goal: Ability to remain free from injury will improve Outcome: Progressing   Problem: Skin Integrity: Goal: Risk for impaired skin integrity will decrease Outcome: Progressing   Problem: Education: Goal: Knowledge of the prescribed therapeutic regimen will improve Outcome: Progressing Goal: Understanding of discharge needs will improve Outcome: Progressing   Problem: Activity: Goal: Ability to avoid complications of mobility impairment will improve Outcome: Progressing Goal: Ability to tolerate increased activity will improve Outcome: Progressing   Problem: Clinical Measurements: Goal: Postoperative complications will be avoided or minimized Outcome: Progressing   Problem:  Pain Management: Goal: Pain level will decrease with appropriate interventions Outcome: Progressing   Problem: Skin Integrity: Goal: Signs of wound healing will improve Outcome: Progressing   

## 2017-09-01 NOTE — Progress Notes (Signed)
SNF and Non-Emergent EMS Transport Benefits:  Number called: [phone] [facility]) Rep: Hosie Poisson Reference Number: [id number]  Coverage active as of 03/22/17.  $3,000 in-network deductible met for calendar year.  Out of pocket max for in-network is $4,000, of which $3,251.82 met so far.  In-network SNF: responsible for a 25% co-insurance after deductible met.  Limited to 60 days per calendar year.  Josem Kaufmann is required: [phone]   Non-emergent EMS transport: $25% co-insurance after deductible met.  Josem Kaufmann is required: [phone].

## 2017-09-01 NOTE — Clinical Social Work Note (Addendum)
CSW spoke to patient and his wife regarding bed offers, they have chosen [facility] [place].  CSW contacted [facility], they can accept patient once insurance Josem Kaufmann has been approved, and patient is medically ready for discharge.  CSW to continue to follow patient's progress throughout discharge planning.  Patient asked about what SNF benefits he has, CSW checked with certified medical assistant and she ran patient's benefits.  CSW updated patient and his daughter with what kind of coverage he has.   Jones Broom. [place], MSW, [place]  09/01/2017 12:20 PM

## 2017-09-01 NOTE — Progress Notes (Signed)
   Subjective: 2 Days Post-Op Procedure(s) (LRB): TOTAL HIP ARTHROPLASTY ANTERIOR APPROACH (Left) Patient reports pain as 5 on 0-10 scale.   Patient doing well.  No complaints.  No chest pain shortness of breath or abdominal pain. Patient states she is feeling better this morning.   Objective: Vital signs in last 24 hours: Temp:  [98.7 F (37.1 C)-99.1 F (37.3 C)] 98.7 F (37.1 C) (06/13 0739) Pulse Rate:  [60-66] 66 (06/13 0739) Resp:  [18-20] 20 (06/13 0739) BP: (127-150)/(61-75) 150/61 (06/13 0739) SpO2:  [95 %-97 %] 96 % (06/13 0739)  Intake/Output from previous day: 06/12 0701 - 06/13 0700 In: 2216.7 [P.O.:360; I.V.:1856.7] Out: 400 [Urine:400] Intake/Output this shift: No intake/output data recorded.  Recent Labs    08/30/17 1921 09/01/17 0418  HGB 14.4 13.9   Recent Labs    08/30/17 1921 09/01/17 0418  WBC 18.5* 12.6*  RBC 4.66 4.36*  HCT 41.9 39.5*  PLT 193 200   Recent Labs    08/30/17 1921 09/01/17 0418  NA  --  136  K  --  4.0  CL  --  106  CO2  --  24  BUN  --  19  CREATININE 0.89 0.95  GLUCOSE  --  123*  CALCIUM  --  8.7*   No results for input(s): LABPT, INR in the last 72 hours.  EXAM General - Patient is Alert, Appropriate and Oriented Extremity - Neurovascular intact Dorsiflexion/Plantar flexion intact No cellulitis present Compartment soft Dressing - dressing C/D/I and no drainage Motor Function - intact, moving foot and toes well on exam.   Past Medical History:  Diagnosis Date  . Arthritis   . Basal cell carcinoma    bASAL CELL  . Chronic kidney disease   . GERD (gastroesophageal reflux disease)   . History of colonic polyps   . History of kidney stones   . Hyperlipidemia   . Nephrolithiasis   . Other and unspecified hyperlipidemia   . PONV (postoperative nausea and vomiting)   . Renal cell carcinoma of right kidney ([place]) 07/30/2015  . S/p nephrectomy 07/30/2015  . Unspecified essential hypertension      Assessment/Plan:   2 Days Post-Op Procedure(s) (LRB): TOTAL HIP ARTHROPLASTY ANTERIOR APPROACH (Left) Active Problems:   Status post total hip replacement, left  Estimated body mass index is 31.46 kg/m as calculated from the following:   Height as of this encounter: 6\' 2"  (1.88 m).   Weight as of this encounter: 111.1 kg (245 lb). Advance diet Up with therapy  Needs BM Plan on discharge to home with home health PT today or tomorrow pending bowel movement and PT goals  DVT Prophylaxis - Lovenox, Foot Pumps and TED hose Weight-Bearing as tolerated to left leg   T. Rachelle Hora, PA-C [place] 09/01/2017, 7:51 AM

## 2017-09-01 NOTE — Discharge Instructions (Signed)

## 2017-09-01 NOTE — Progress Notes (Signed)
Pt states "I feel just terrilble"  Sweating much, nauseated most of day, urine dark, slight fever. Notified md. Orders recd. Will cont to monitor.

## 2017-09-01 NOTE — Progress Notes (Signed)
Physical Therapy Treatment Patient Details Name: Nicholas Thompson MRN: [id number] DOB: [birth year] Today's Date: 09/01/2017    History of Present Illness 62yo male pt admitted for acute hospitalization status post L THR, anterior approach, 08/29/17; WBAT.    PT Comments    Patient remains generally limited by pain.  Extensive cuing/encouragement for full, active effort throughout treatment session; limited ability/willingness to progress mobility this session, requiring no less than mod assist +1 for all mobility tasks. Significant concern regarding patient's ability to safely/effectively negotiate home environment with support from wife alone (currently mobilizes with 4WRW herself).  Continue to strongly recommend transition to STR at discharge.  Patient/wife continue to express desire for transition home. Will plan to initiate stairs this PM and consider involvement of wife in physical transfer/assist aspects of patient care to enhance their awareness of level of care required at discharge.    Follow Up Recommendations  SNF     Equipment Recommendations  Rolling walker with 5" wheels;3in1 (PT)    Recommendations for Other Services       Precautions / Restrictions Precautions Precautions: Anterior Hip;Fall Restrictions Weight Bearing Restrictions: Yes LLE Weight Bearing: Weight bearing as tolerated    Mobility  Bed Mobility Overal bed mobility: Needs Assistance Bed Mobility: Supine to Sit     Supine to sit: Mod assist     General bed mobility comments: near total assist for L LE management  Transfers Overall transfer level: Needs assistance Equipment used: Rolling walker (2 wheeled) Transfers: Sit to/from Stand Sit to Stand: Mod assist         General transfer comment: cuing for hand placement, transfer mechanics.  Maintains mild rotation with excessive weight shift to R LE, very limited active use/WBing with each transfer.  Ambulation/Gait Ambulation/Gait assistance:  Min assist;Mod assist;+2 safety/equipment Ambulation Distance (Feet): (30' x1, 5' x1) Assistive device: Rolling walker (2 wheeled)       General Gait Details: 3-point, step to gait pattern.  Very slow and effortful.  Improved advancement of L LE, but unable to progress distance due to pain and reports of dizziness (vitals stable and WFL)   Stairs             Wheelchair Mobility    Modified Rankin (Stroke Patients Only)       Balance Overall balance assessment: Needs assistance Sitting-balance support: No upper extremity supported;Feet supported Sitting balance-[name]: Good     Standing balance support: Bilateral upper extremity supported Standing balance-[name]: Fair                              Cognition Arousal/Alertness: Awake/alert Behavior During Therapy: WFL for tasks assessed/performed Overall Cognitive Status: Within Functional Limits for tasks assessed                                        Exercises Other Exercises Other Exercises: Sit/stand x3 with RW (x1 from edge of bed, x2 from recliner), mod assist +1-broad BOS, very minimal actives use of L LE; poor anterior weight translation with extensive assist from therapist to complete. Unable to acheive/tolerate full L hip extension due to pain, limited stance/WBing Other Exercises: Standing balance to utilize urinal, min/mod assist with RW-excessive weight shift to R LE with L hip/knee in flexed position throughout    General Comments        Pertinent  Vitals/Pain Pain Assessment: Faces Faces Pain Scale: Hurts whole lot Pain Location: L hip Pain Intervention(s): Limited activity within patient's tolerance;Monitored during session;Repositioned    Home Living                      Prior Function            PT Goals (current goals can now be found in the care plan section) Acute Rehab PT Goals Patient Stated Goal: have less pain and get moving again PT  Goal Formulation: With patient/family Time For Goal Achievement: 09/14/17 Potential to Achieve Goals: Good    Frequency    BID      PT Plan      Co-evaluation              AM-PAC PT "6 Clicks" Daily Activity  Outcome Measure  Difficulty turning over in bed (including adjusting bedclothes, sheets and blankets)?: Unable Difficulty moving from lying on back to sitting on the side of the bed? : Unable Difficulty sitting down on and standing up from a chair with arms (e.g., wheelchair, bedside commode, etc,.)?: Unable Help needed moving to and from a bed to chair (including a wheelchair)?: A Lot Help needed walking in hospital room?: A Lot Help needed climbing 3-5 steps with a railing? : A Lot 6 Click Score: 9    End of Session Equipment Utilized During Treatment: Gait belt Activity Tolerance: Patient limited by pain Patient left: in chair;with call bell/phone within reach;with chair alarm set;with family/visitor present Nurse Communication: Mobility status PT Visit Diagnosis: Difficulty in walking, not elsewhere classified (R26.2);Muscle weakness (generalized) (M62.81);Pain Pain - Right/Left: Left Pain - part of body: Hip     Time: 1448-1856 PT Time Calculation (min) (ACUTE ONLY): 32 min  Charges:  $Gait Training: 8-22 mins $Therapeutic Activity: 8-22 mins                    G Codes:      Evaluation/treatment session was lead and completed by Roxan Hockey, SPT; directly observed, guided and documented by Tera Helper, [place]. Owens Shark, PT, DPT, NCS 09/01/17, 9:48 AM [phone]

## 2017-09-01 NOTE — Progress Notes (Signed)
Physical Therapy Treatment Patient Details Name: Nicholas Thompson MRN: [id number] DOB: 04-Aug-1955 Today's Date: 09/01/2017    History of Present Illness 62yo male pt admitted for acute hospitalization status post L THR, anterior approach, 08/29/17; WBAT.    PT Comments    Patient with gradual mobility progression (gait x40' with RW, up/down single step with bilat rails), but requiring extensive/mod assist from therapist for safe and successful completion.  Very slow and effortful with all mobility tasks; continues to be limited by pain. Extensive discussion with wife regarding reason/rationale for recommendation for STR.  Assured her that recommendation was not related to her physical status, but was directly related to his performance (level of assist required, limited mobility progression in immediate post-op phase) and concerns for his ability to safely/effectively negotiate home environment.  She was concerned about financial requirements of STR-referred her to CSW for information regarding co-pay and patient responsibility.  Provided active listening and support/encouragement through situation.  Reinforced that hospital care team is working to promote/achieve best outcomes for patient and would continue to do so throughout remainder of stay.  Wife appreciative of feedback. Patient up in chair upon return to session.  Encouraged positioning OOB/in chair through dinner this date. Patient voiced understanding.   Follow Up Recommendations  SNF     Equipment Recommendations  Rolling walker with 5" wheels;3in1 (PT)    Recommendations for Other Services       Precautions / Restrictions Precautions Precautions: Anterior Hip;Fall Restrictions Weight Bearing Restrictions: Yes LLE Weight Bearing: Weight bearing as tolerated    Mobility  Bed Mobility Overal bed mobility: Needs Assistance       Supine to sit: Mod assist     General bed mobility comments: completed towards L side of bed to  simulate home environment.  Poor tolerance for WBing L hip  Transfers Overall transfer level: Needs assistance Equipment used: Rolling walker (2 wheeled) Transfers: Sit to/from Stand Sit to Stand: Mod assist         General transfer comment: encouraged for L LE anterior to BOS to decreased pressure/pain in L LE with movement transition.  Continues to require extensive time/cuing/effort for completion of each transfer.  Poor tolerance for any amount of L hip flexion, making transfers from all seating surfaces increasingly difficult  Ambulation/Gait Ambulation/Gait assistance: Min assist;Mod assist Gait Distance (Feet): 40 Feet Assistive device: Rolling walker (2 wheeled)       General Gait Details: constant cuing for postural extension/upward gaze and continuous stepping pattern.  Improving L LE step length, but very heavy WBing bilat UEs   Stairs Stairs: Yes Stairs assistance: Mod assist Stair Management: Two rails Number of Stairs: 1 General stair comments: up/down single (6") step with bilat UEs, very guarded and effortful; however, fair/good stability of L hip in modified SLS   Wheelchair Mobility    Modified Rankin (Stroke Patients Only)       Balance                                            Cognition Arousal/Alertness: Awake/alert Behavior During Therapy: WFL for tasks assessed/performed Overall Cognitive Status: Within Functional Limits for tasks assessed  Exercises Other Exercises Other Exercises: Toilet transfer, ambulatory with RW, min/mod assist; min/mod assist for sit/stand from [facility] with RW; min assist for standing standing balance with RW to manage undergarments, complete hand hygiene    General Comments        Pertinent Vitals/Pain Pain Assessment: 0-10 Pain Score: 6  Pain Location: L hip Pain Descriptors / Indicators: Aching;Grimacing;Sharp;Shooting Pain  Intervention(s): Limited activity within patient's tolerance;Monitored during session;Repositioned    Home Living                      Prior Function            PT Goals (current goals can now be found in the care plan section) Acute Rehab PT Goals Patient Stated Goal: have less pain and get moving again PT Goal Formulation: With patient/family Time For Goal Achievement: 09/14/17 Potential to Achieve Goals: Good Progress towards PT goals: Progressing toward goals    Frequency    BID      PT Plan Current plan remains appropriate    Co-evaluation              AM-PAC PT "6 Clicks" Daily Activity  Outcome Measure  Difficulty turning over in bed (including adjusting bedclothes, sheets and blankets)?: Unable Difficulty moving from lying on back to sitting on the side of the bed? : Unable Difficulty sitting down on and standing up from a chair with arms (e.g., wheelchair, bedside commode, etc,.)?: Unable Help needed moving to and from a bed to chair (including a wheelchair)?: A Lot Help needed walking in hospital room?: A Lot Help needed climbing 3-5 steps with a railing? : A Lot 6 Click Score: 9    End of Session Equipment Utilized During Treatment: Gait belt Activity Tolerance: Patient limited by pain Patient left: in chair;with call bell/phone within reach;with chair alarm set;with family/visitor present Nurse Communication: Mobility status PT Visit Diagnosis: Difficulty in walking, not elsewhere classified (R26.2);Muscle weakness (generalized) (M62.81);Pain Pain - Right/Left: Left Pain - part of body: Hip     Time: 1350-1458 PT Time Calculation (min) (ACUTE ONLY): 68 min  Charges:  $Gait Training: 38-52 mins $Therapeutic Activity: 23-37 mins                    G Codes:       Evaluation/treatment session was lead and completed by Roxan Hockey, SPT; directly observed, guided and documented by Tera Helper, [place]. Owens Shark, PT, DPT,  NCS 09/01/17, 5:08 PM [phone]

## 2017-09-01 NOTE — Progress Notes (Signed)
Pt and wife feel the oxycodone is making patient feel nauseated and "not feel good". They request no oxy. Pt also has side effect of vomiting with vicodin. Pt and wife agree to tylenol and ultram. Discussed with Dr. Rudene Christians, oders changed. Will cont to monitor pt.

## 2017-09-02 MED ORDER — BISACODYL 10 MG RE SUPP
10.0000 mg | Freq: Once | RECTAL | Status: AC
  Administered 2017-09-02: 10 mg via RECTAL

## 2017-09-02 MED ORDER — TRAMADOL HCL 50 MG PO TABS: 50.0000 mg | ORAL_TABLET | Freq: Four times a day (QID) | ORAL | 1 refills | Status: DC

## 2017-09-02 NOTE — Progress Notes (Signed)
Occupational Therapy Treatment Patient Details Name: Nicholas Thompson MRN: [id number] DOB: [birth year] Today's Date: 09/02/2017    History of present illness 62yo male pt admitted for acute hospitalization status post L THR, anterior approach, 08/29/17; WBAT.   OT comments  Patient was sitting up in recliner when OT arrived. Patient was pleasant and eager to work with OT. States pain level is much improved today, with no pain when he ambulated to bathroom with nursing recently. Patient educated on safety and use of DME in home. Patient able to return demonstrate use of AE for lower body dressing. States he has reviewed several safety issues with family prior to admission, and feels prepared for discharge.   Follow Up Recommendations  SNF    Equipment Recommendations  Other (comment);3 in 1 bedside commode    Recommendations for Other Services      Precautions / Restrictions Precautions Precautions: Anterior Hip;Fall Restrictions Weight Bearing Restrictions: Yes LLE Weight Bearing: Weight bearing as tolerated       Mobility Bed Mobility                  Transfers                      Balance                                           ADL either performed or assessed with clinical judgement   ADL Overall ADL's : Needs assistance/impaired             Lower Body Bathing: Minimal assistance;Moderate assistance;Sitting/lateral leans Lower Body Bathing Details (indicate cue type and reason): pt/family/spouse instructed in seated shower once cleared to shower by surgeon     Lower Body Dressing: Minimal assistance;With adaptive equipment;Sitting/lateral leans Lower Body Dressing Details (indicate cue type and reason): Pt educated in AE to support recall and carryover   Toilet Transfer Details (indicate cue type and reason): instructed to use urinal at night beside bed initially, BSC frame over standard commode at home to improve safety                  Vision Baseline Vision/History: Wears glasses Wears Glasses: At all times Patient Visual Report: No change from baseline     Perception     Praxis      Cognition Arousal/Alertness: Awake/alert Behavior During Therapy: WFL for tasks assessed/performed Overall Cognitive Status: Within Functional Limits for tasks assessed                                          Exercises     Shoulder Instructions       General Comments      Pertinent Vitals/ Pain       Pain Assessment: No/denies pain  Home Living                                          Prior Functioning/Environment              Frequency  Min 1X/week        Progress Toward Goals  OT Goals(current goals can now be found in the care plan  section)  Progress towards OT goals: Progressing toward goals  Acute Rehab OT Goals Patient Stated Goal: be able to go home OT Goal Formulation: With patient Time For Goal Achievement: 09/14/17 Potential to Achieve Goals: Good  Plan Discharge plan remains appropriate;Frequency remains appropriate    Co-evaluation                 AM-PAC PT "6 Clicks" Daily Activity     Outcome Measure   Help from another person eating meals?: None Help from another person taking care of personal grooming?: None Help from another person toileting, which includes using toliet, bedpan, or urinal?: A Lot Help from another person bathing (including washing, rinsing, drying)?: A Lot Help from another person to put on and taking off regular upper body clothing?: None Help from another person to put on and taking off regular lower body clothing?: A Lot 6 Click Score: 18    End of Session    OT Visit Diagnosis: Other abnormalities of gait and mobility (R26.89);Pain   Activity Tolerance Patient tolerated treatment well   Patient Left in chair;with call bell/phone within reach;with chair alarm set;with family/visitor present    Nurse Communication          Time: 6979-4801 OT Time Calculation (min): 25 min  Charges: OT General Charges $OT Visit: 1 Visit OT Treatments $Self Care/Home Management : 23-37 mins  Amie [place], OTR/L   Dominique Ressel L 09/02/2017, 4:15 PM

## 2017-09-02 NOTE — Clinical Social Work Note (Signed)
CSW received phone call from [facility] they have received insurance authorization effective tomorrow.  [facility] can accept patient tomorrow, CSW updated patient's family and bedside nurse.  Jones Broom. [place], MSW, [place]  09/02/2017 5:13 PM

## 2017-09-02 NOTE — Progress Notes (Signed)
Pt refused Tramadol all night. Shivering episodes noted despite warm blankets. Pt has been afebrile throughout the shift. Pt assisted to the bathroom (2 person assist) to have a BM but unable to do so. Pt was offered Dulcolax suppository but "shivered" again as soon as blankets were taken off. Will continue to monitor.

## 2017-09-02 NOTE — Progress Notes (Signed)
Physical Therapy Treatment Patient Details Name: Nicholas Thompson MRN: [id number] DOB: 01-24-1956 Today's Date: 09/02/2017    History of Present Illness 62yo male pt admitted for acute hospitalization status post L THR, anterior approach, 08/29/17; WBAT.    PT Comments    Improved pain control and overall demeanor this date.  Less anxious, more actively engaged/participatory with session. Able to complete basic sit/stand from variety of seating surfaces with cga/min assist and completes full lap around nursing station with RW, cga.  Will continue to progress with emphasis on activities to promote WBing/stability of L hip and functional flexion of L hip (assigned HEP to address). Will plan to complete stair training this PM (full 3 steps if possible to simulate entry/exit of home).   Follow Up Recommendations  SNF     Equipment Recommendations  Rolling walker with 5" wheels;3in1 (PT)    Recommendations for Other Services       Precautions / Restrictions Precautions Precautions: Anterior Hip;Fall Restrictions Weight Bearing Restrictions: Yes LLE Weight Bearing: Weight bearing as tolerated    Mobility  Bed Mobility Overal bed mobility: Needs Assistance Bed Mobility: Supine to Sit     Supine to sit: Min assist     General bed mobility comments: very heavy use of bedrails  Transfers Overall transfer level: Needs assistance Equipment used: Rolling walker (2 wheeled) Transfers: Sit to/from Stand Sit to Stand: Min guard         General transfer comment: encouraged for faster, more efficient movement transition; reinforced hand placement, mechanics and LE foot placement (L LE anterior to BOS for pain control).  Improved performance compared to previous sessions.  Ambulation/Gait Ambulation/Gait assistance: Min guard;Min assist Gait Distance (Feet): 200 Feet Assistive device: Rolling walker (2 wheeled)       General Gait Details: step to progressing to step through gait  pattern; constant cuing for postural extension and upward gaze, bilat LE step length.  constant cuing for overall relaxation with improved (normalized) cadence and overall gait speed.   Stairs             Wheelchair Mobility    Modified Rankin (Stroke Patients Only)       Balance Overall balance assessment: Needs assistance Sitting-balance support: No upper extremity supported;Feet supported Sitting balance-[name]: Good     Standing balance support: Bilateral upper extremity supported Standing balance-[name]: Fair                              Cognition Arousal/Alertness: Awake/alert Behavior During Therapy: WFL for tasks assessed/performed Overall Cognitive Status: Within Functional Limits for tasks assessed                                        Exercises Other Exercises Other Exercises: Toilet transfer, ambulatory with RW, cga/min assist; sit/stand from [facility] with RW, cga/min assist.  Static standing balance for hygiene, clothing management, cga with RW. Other Exercises: Set up/sup for ADL in seated position on BSC; cga/min assist for standing balance during peri-care. Other Exercises: Instructed in goal of symmetrical WBing bilat hips in all WBing positions and need for progressive hip/trunk flexion activities (x10/hour) to increased tolerance and functional ROM.  Patient voiced/demonstrated understanding.    General Comments        Pertinent Vitals/Pain Pain Assessment: 0-10 Faces Pain Scale: Hurts even more Pain Location: L  hip Pain Descriptors / Indicators: Aching;Grimacing;Sharp;Shooting Pain Intervention(s): Limited activity within patient's tolerance;Monitored during session;Repositioned    Home Living                      Prior Function            PT Goals (current goals can now be found in the care plan section) Acute Rehab PT Goals Patient Stated Goal: have less pain and get moving again PT Goal  Formulation: With patient/family Time For Goal Achievement: 09/14/17 Potential to Achieve Goals: Good Progress towards PT goals: Progressing toward goals    Frequency    BID      PT Plan Current plan remains appropriate    Co-evaluation              AM-PAC PT "6 Clicks" Daily Activity  Outcome Measure  Difficulty turning over in bed (including adjusting bedclothes, sheets and blankets)?: Unable Difficulty moving from lying on back to sitting on the side of the bed? : Unable Difficulty sitting down on and standing up from a chair with arms (e.g., wheelchair, bedside commode, etc,.)?: Unable Help needed moving to and from a bed to chair (including a wheelchair)?: A Little Help needed walking in hospital room?: A Little Help needed climbing 3-5 steps with a railing? : A Lot 6 Click Score: 11    End of Session Equipment Utilized During Treatment: Gait belt Activity Tolerance: Patient tolerated treatment well Patient left: in chair;with call bell/phone within reach;with chair alarm set;with family/visitor present Nurse Communication: Mobility status PT Visit Diagnosis: Difficulty in walking, not elsewhere classified (R26.2);Muscle weakness (generalized) (M62.81);Pain Pain - Right/Left: Left Pain - part of body: Hip     Time: 1011-1055 PT Time Calculation (min) (ACUTE ONLY): 44 min  Charges:  $Gait Training: 8-22 mins $Therapeutic Activity: 23-37 mins                    G Codes:       Diya Gervasi H. Owens Shark, PT, DPT, NCS 09/02/17, 11:28 AM [phone]

## 2017-09-02 NOTE — Progress Notes (Signed)
   Subjective: 3 Days Post-Op Procedure(s) (LRB): TOTAL HIP ARTHROPLASTY ANTERIOR APPROACH (Left) Patient reports pain as 5 on 0-10 scale.   Patient had a total body shake or shaver this morning.  He has some anxiety about his progress.  No chest pain shortness of breath or abdominal pain.   Objective: Vital signs in last 24 hours: Temp:  [98.2 F (36.8 C)-100.4 F (38 C)] 98.2 F (36.8 C) (06/14 0630) Pulse Rate:  [53-70] 53 (06/13 2343) Resp:  [18-20] 19 (06/13 2343) BP: (132-150)/(58-66) 142/66 (06/13 2343) SpO2:  [96 %-100 %] 100 % (06/13 2343)  Intake/Output from previous day: 06/13 0701 - 06/14 0700 In: 600 [P.O.:600] Out: 730 [Urine:730] Intake/Output this shift: Total I/O In: 240 [P.O.:240] Out: 430 [Urine:430]  Recent Labs    08/30/17 1921 09/01/17 0418  HGB 14.4 13.9   Recent Labs    08/30/17 1921 09/01/17 0418  WBC 18.5* 12.6*  RBC 4.66 4.36*  HCT 41.9 39.5*  PLT 193 200   Recent Labs    09/01/17 0418 09/01/17 1857  NA 136 135  K 4.0 3.8  CL 106 101  CO2 24 22  BUN 19 23*  CREATININE 0.95 1.09  GLUCOSE 123* 145*  CALCIUM 8.7* 8.9   No results for input(s): LABPT, INR in the last 72 hours.  EXAM General - Patient is Alert, Appropriate and Oriented Extremity - Neurovascular intact Dorsiflexion/Plantar flexion intact No cellulitis present Compartment soft Dressing - dressing C/D/I and no drainage Motor Function - intact, moving foot and toes well on exam.  The patient ambulated 40 feet with physical therapy  Past Medical History:  Diagnosis Date  . Arthritis   . Basal cell carcinoma    bASAL CELL  . Chronic kidney disease   . GERD (gastroesophageal reflux disease)   . History of colonic polyps   . History of kidney stones   . Hyperlipidemia   . Nephrolithiasis   . Other and unspecified hyperlipidemia   . PONV (postoperative nausea and vomiting)   . Renal cell carcinoma of right kidney ([place]) 07/30/2015  . S/p nephrectomy 07/30/2015   . Unspecified essential hypertension     Assessment/Plan:   3 Days Post-Op Procedure(s) (LRB): TOTAL HIP ARTHROPLASTY ANTERIOR APPROACH (Left) Active Problems:   Status post total hip replacement, left  Estimated body mass index is 31.46 kg/m as calculated from the following:   Height as of this encounter: 6\' 2"  (1.88 m).   Weight as of this encounter: 111.1 kg (245 lb). Advance diet Up with therapy  Needs BM Plan on discharge to home with home health PT Saturday  pending bowel movement and PT goals  DVT Prophylaxis - Lovenox, Foot Pumps and TED hose Weight-Bearing as tolerated to left leg   Reche Dixon, PA-C [place] 09/02/2017, 6:49 AM

## 2017-09-02 NOTE — Discharge Summary (Addendum)
Physician Discharge Summary  Subjective: 3 Days Post-Op Procedure(s) (LRB): TOTAL HIP ARTHROPLASTY ANTERIOR APPROACH (Left) Patient reports pain as moderate.   Patient seen in rounds with Dr. Rudene Christians. Patient is well, and has had no acute complaints or problems Patient is ready to go to rehab for physical therapy before going home  Physician Discharge Summary  Patient ID: Nicholas Thompson MRN: [id number] DOB/AGE: Jul 21, 1955 62 y.o.  Admit date: 08/30/2017 Discharge date: 09/03/2017  Admission Diagnoses:  Discharge Diagnoses:  Active Problems:   Status post total hip replacement, left   Discharged Condition: fair  Hospital Course: The patient is status post left anterior approach total hip replacement.  The patient was doing well on day 1 and ambulated 40 feet.  His vitals are stable.  The patient did have a slightly elevated temperature to 100.4 on the evening of postop day 1.  The patient had a urinalysis that was normal.  He had no fever on the morning of postop day 2.  The patient did have a total body shake that was concerning to the patient.  He was having some anxiety from surgery.  The patient continued working with physical therapy and was ready to go home on postop day 3.  The patient had a bowel movement before discharge.  Treatments: surgery:  TOTAL HIP ARTHROPLASTY ANTERIOR APPROACH (Left)  SURGEON: Laurene Footman, MD  ASSISTANTS: None  ANESTHESIA:   spinal  EBL:  Total I/O In: 800 [I.V.:800] Out: -   BLOOD ADMINISTERED:none  DRAINS: none   LOCAL MEDICATIONS USED:  MARCAINE     SPECIMEN:  Source of Specimen:  Left femoral head  DISPOSITION OF SPECIMEN:  PATHOLOGY  COUNTS:  YES  TOURNIQUET:  * No tourniquets in log *  IMPLANTS: Medacta AMIS standard 5 stem with 56 mmMpact TM cup with liner and S ceramic 28 mm head     Discharge Exam: Blood pressure (!) 142/66, pulse (!) 53, temperature 98.2 F (36.8 C), temperature source Oral, resp. rate 19,  height 6\' 2"  (1.88 m), weight 111.1 kg (245 lb), SpO2 100 %.   Disposition:    Allergies as of 09/02/2017      Reactions   Vicodin [hydrocodone-acetaminophen] Nausea And Vomiting      Medication List    TAKE these medications   docusate sodium 100 MG capsule Commonly known as:  COLACE Take 1 capsule (100 mg total) by mouth 2 (two) times daily.   enalapril 20 MG tablet Commonly known as:  VASOTEC TAKE TWO TABLETS BY MOUTH ONCE DAILY   enoxaparin 40 MG/0.4ML injection Commonly known as:  LOVENOX Inject 0.4 mLs (40 mg total) into the skin daily.   ketoconazole 2 % cream Commonly known as:  NIZORAL Apply 1 application topically daily as needed for irritation.   Melatonin 3 MG Tabs Take 12 mg by mouth at bedtime as needed (sleep).   omeprazole 20 MG capsule Commonly known as:  PRILOSEC Take 1 capsule (20 mg total) by mouth daily.   oxyCODONE 5 MG immediate release tablet Commonly known as:  Oxy IR/ROXICODONE Take 1-2 tablets (5-10 mg total) by mouth every 4 (four) hours as needed for moderate pain (pain score 4-6).   traMADol 50 MG tablet Commonly known as:  ULTRAM Take 1 tablet (50 mg total) by mouth every 6 (six) hours as needed. What changed:  reasons to take this   traMADol 50 MG tablet Commonly known as:  ULTRAM Take 1 tablet (50 mg total) by mouth every 6 (  six) hours. What changed:  You were already taking a medication with the same name, and this prescription was added. Make sure you understand how and when to take each.            Durable Medical Equipment  (From admission, onward)        Start     Ordered   08/30/17 1845  DME Walker rolling  Once    Question:  Patient needs a walker to treat with the following condition  Answer:  Status post total hip replacement, left   08/30/17 1844   08/30/17 1845  DME 3 n 1  Once     08/30/17 1844   08/30/17 1845  DME Bedside commode  Once    Question:  Patient needs a bedside commode to treat with the  following condition  Answer:  Status post total hip replacement, left   08/30/17 1844      Contact information for follow-up providers    Duanne Guess, PA-C Follow up in 2 week(s).   Specialties:  Orthopedic Surgery, Emergency Medicine Contact information: [place] [address] [phone]            Contact information for after-discharge care    Destination    [facility] .   Service:  Skilled Nursing Contact information: [address] [place] [phone]                  Signed: Prescott Parma, TODD 09/02/2017, 6:51 AM   Objective: Vital signs in last 24 hours: Temp:  [98.2 F (36.8 C)-100.4 F (38 C)] 98.2 F (36.8 C) (06/14 0630) Pulse Rate:  [53-70] 53 (06/13 2343) Resp:  [18-20] 19 (06/13 2343) BP: (132-150)/(58-66) 142/66 (06/13 2343) SpO2:  [96 %-100 %] 100 % (06/13 2343)  Intake/Output from previous day:  Intake/Output Summary (Last 24 hours) at 09/02/2017 0651 Last data filed at 09/02/2017 0600 Gross per 24 hour  Intake 600 ml  Output 730 ml  Net -130 ml    Intake/Output this shift: Total I/O In: 240 [P.O.:240] Out: 430 [Urine:430]  Labs: Recent Labs    08/30/17 1921 09/01/17 0418  HGB 14.4 13.9   Recent Labs    08/30/17 1921 09/01/17 0418  WBC 18.5* 12.6*  RBC 4.66 4.36*  HCT 41.9 39.5*  PLT 193 200   Recent Labs    09/01/17 0418 09/01/17 1857  NA 136 135  K 4.0 3.8  CL 106 101  CO2 24 22  BUN 19 23*  CREATININE 0.95 1.09  GLUCOSE 123* 145*  CALCIUM 8.7* 8.9   No results for input(s): LABPT, INR in the last 72 hours.  EXAM: General - Patient is Alert and Oriented Extremity - Sensation intact distally Dorsiflexion/Plantar flexion intact No cellulitis present Compartment soft Incision - clean, dry, no drainage Motor Function -plantarflexion and dorsiflexion are intact.  Assessment/Plan: 3 Days Post-Op Procedure(s) (LRB): TOTAL HIP ARTHROPLASTY  ANTERIOR APPROACH (Left) Procedure(s) (LRB): TOTAL HIP ARTHROPLASTY ANTERIOR APPROACH (Left) Past Medical History:  Diagnosis Date  . Arthritis   . Basal cell carcinoma    bASAL CELL  . Chronic kidney disease   . GERD (gastroesophageal reflux disease)   . History of colonic polyps   . History of kidney stones   . Hyperlipidemia   . Nephrolithiasis   . Other and unspecified hyperlipidemia   . PONV (postoperative nausea and vomiting)   . Renal cell carcinoma of right kidney ([place]) 07/30/2015  .  S/p nephrectomy 07/30/2015  . Unspecified essential hypertension    Active Problems:   Status post total hip replacement, left  Estimated body mass index is 31.46 kg/m as calculated from the following:   Height as of this encounter: 6\' 2"  (1.88 m).   Weight as of this encounter: 111.1 kg (245 lb). Advance diet Up with therapy Plan for discharge tomorrow to rehab Diet - Regular diet Follow up - in 2 weeks Activity - WBAT Disposition - Rehab Condition Upon Discharge - Stable DVT Prophylaxis - Lovenox and TED hose  Reche Dixon, PA-C Orthopaedic Surgery 09/02/2017, 6:51 AM

## 2017-09-02 NOTE — Clinical Social Work Note (Signed)
CSW contacted [facility] they have not received insurance authorization yet, they are still waiting.  CSW continuing to follow patient's progress throughout discharge planning.  Jones Broom. [place], MSW, [place]  09/02/2017 4:13 PM

## 2017-09-03 DIAGNOSIS — F5101 Primary insomnia: Secondary | ICD-10-CM | POA: Diagnosis not present

## 2017-09-03 DIAGNOSIS — M6281 Muscle weakness (generalized): Secondary | ICD-10-CM | POA: Diagnosis not present

## 2017-09-03 DIAGNOSIS — R52 Pain, unspecified: Secondary | ICD-10-CM | POA: Diagnosis not present

## 2017-09-03 DIAGNOSIS — Z7401 Bed confinement status: Secondary | ICD-10-CM | POA: Diagnosis not present

## 2017-09-03 DIAGNOSIS — Z5189 Encounter for other specified aftercare: Secondary | ICD-10-CM | POA: Diagnosis not present

## 2017-09-03 DIAGNOSIS — R262 Difficulty in walking, not elsewhere classified: Secondary | ICD-10-CM | POA: Diagnosis not present

## 2017-09-03 DIAGNOSIS — Z9181 History of falling: Secondary | ICD-10-CM | POA: Diagnosis not present

## 2017-09-03 DIAGNOSIS — K219 Gastro-esophageal reflux disease without esophagitis: Secondary | ICD-10-CM | POA: Diagnosis not present

## 2017-09-03 DIAGNOSIS — E785 Hyperlipidemia, unspecified: Secondary | ICD-10-CM | POA: Diagnosis not present

## 2017-09-03 DIAGNOSIS — K5900 Constipation, unspecified: Secondary | ICD-10-CM | POA: Diagnosis not present

## 2017-09-03 DIAGNOSIS — Z96642 Presence of left artificial hip joint: Secondary | ICD-10-CM | POA: Diagnosis not present

## 2017-09-03 DIAGNOSIS — G8929 Other chronic pain: Secondary | ICD-10-CM | POA: Diagnosis not present

## 2017-09-03 DIAGNOSIS — S72143A Displaced intertrochanteric fracture of unspecified femur, initial encounter for closed fracture: Secondary | ICD-10-CM | POA: Diagnosis not present

## 2017-09-03 DIAGNOSIS — R21 Rash and other nonspecific skin eruption: Secondary | ICD-10-CM | POA: Diagnosis not present

## 2017-09-03 DIAGNOSIS — S72143D Displaced intertrochanteric fracture of unspecified femur, subsequent encounter for closed fracture with routine healing: Secondary | ICD-10-CM | POA: Diagnosis not present

## 2017-09-03 DIAGNOSIS — I10 Essential (primary) hypertension: Secondary | ICD-10-CM | POA: Diagnosis not present

## 2017-09-03 LAB — URINE CULTURE: Culture: NO GROWTH

## 2017-09-03 NOTE — Clinical Social Work Note (Signed)
The patient will discharge today to [facility] Room 605. The patient, his family, and the facility are aware and in agreement. The patient plans to use family for transportation; however, should that prove too risky for re-injury, the CSW has included documentation for EMS. The CSW has delivered the discharge packet to the chart including all hard scripts. The CSW is signing off. Please consult should additional needs arise.  Santiago Bumpers, MSW, [place] [phone]

## 2017-09-03 NOTE — Progress Notes (Signed)
   Subjective: 4 Days Post-Op Procedure(s) (LRB): TOTAL HIP ARTHROPLASTY ANTERIOR APPROACH (Left) Patient reports pain as 5 on 0-10 scale.   Pt completing therapy this AM when entering room. Denies any SOB, CP. No abdominal pain. Pt has had a BM.   Objective: Vital signs in last 24 hours: Temp:  [98.5 F (36.9 C)-99.2 F (37.3 C)] 98.7 F (37.1 C) (06/15 0758) Pulse Rate:  [60-70] 70 (06/15 0758) Resp:  [18-19] 18 (06/15 0758) BP: (122-146)/(65-78) 146/78 (06/15 0758) SpO2:  [98 %-100 %] 100 % (06/15 0758)  Intake/Output from previous day: 06/14 0701 - 06/15 0700 In: 600 [P.O.:600] Out: 200 [Urine:200] Intake/Output this shift: No intake/output data recorded.  Recent Labs    09/01/17 0418  HGB 13.9   Recent Labs    09/01/17 0418  WBC 12.6*  RBC 4.36*  HCT 39.5*  PLT 200   Recent Labs    09/01/17 0418 09/01/17 1857  NA 136 135  K 4.0 3.8  CL 106 101  CO2 24 22  BUN 19 23*  CREATININE 0.95 1.09  GLUCOSE 123* 145*  CALCIUM 8.7* 8.9   No results for input(s): LABPT, INR in the last 72 hours.  EXAM General - Patient is Alert, Appropriate and Oriented Extremity - Neurovascular intact Dorsiflexion/Plantar flexion intact No cellulitis present Compartment soft Dressing - dressing C/D/I and no drainage Motor Function - intact, moving foot and toes well on exam.  The patient ambulated 200 feet with physical therapy  Past Medical History:  Diagnosis Date  . Arthritis   . Basal cell carcinoma    bASAL CELL  . Chronic kidney disease   . GERD (gastroesophageal reflux disease)   . History of colonic polyps   . History of kidney stones   . Hyperlipidemia   . Nephrolithiasis   . Other and unspecified hyperlipidemia   . PONV (postoperative nausea and vomiting)   . Renal cell carcinoma of right kidney ([place]) 07/30/2015  . S/p nephrectomy 07/30/2015  . Unspecified essential hypertension     Assessment/Plan:   4 Days Post-Op Procedure(s) (LRB): TOTAL HIP  ARTHROPLASTY ANTERIOR APPROACH (Left) Active Problems:   Status post total hip replacement, left  Estimated body mass index is 31.46 kg/m as calculated from the following:   Height as of this encounter: 6\' 2"  (1.88 m).   Weight as of this encounter: 111.1 kg (245 lb). Advance diet Up with therapy   Pt has had a BM. Pt insurance has been approved for discharge to SNF today. Plan will be for discharge today.  DVT Prophylaxis - Lovenox, Foot Pumps and TED hose Weight-Bearing as tolerated to left leg  J. Cameron Proud, PA-C [place] 09/03/2017, 9:02 AM

## 2017-09-03 NOTE — Clinical Social Work Placement (Signed)
   CLINICAL SOCIAL WORK PLACEMENT  NOTE  Date:  09/03/2017  Patient Details  Name: Nicholas Thompson MRN: [id number] Date of Birth: 04-Aug-1955  Clinical Social Work is seeking post-discharge placement for this patient at the [place] level of care (*CSW will initial, date and re-position this form in  chart as items are completed):  Yes   Patient/family provided with [facility] Work Department's list of facilities offering this level of care within the geographic area requested by the patient (or if unable, by the patient's family).  Yes   Patient/family informed of their freedom to choose among providers that offer the needed level of care, that participate in Medicare, Medicaid or managed care program needed by the patient, have an available bed and are willing to accept the patient.  Yes   Patient/family informed of [facility]'s ownership interest in [facility] and [facility], as well as of the fact that they are under no obligation to receive care at these facilities.  PASRR submitted to EDS on 08/31/17     PASRR number received on 08/31/17     Existing PASRR number confirmed on       FL2 transmitted to all facilities in geographic area requested by pt/family on 08/31/17     FL2 transmitted to all facilities within larger geographic area on       Patient informed that his/her managed care company has contracts with or will negotiate with certain facilities, including the following:        Yes   Patient/family informed of bed offers received.  Patient chooses bed at [facility]     Physician recommends and patient chooses bed at      Patient to be transferred to [facility] on 09/03/17.  Patient to be transferred to facility by Family transportation     Patient family notified on 09/03/17 of transfer.  Name of family member notified:  Spouse     PHYSICIAN Please sign FL2     Additional Comment:     _______________________________________________ Zettie Pho, LCSW 09/03/2017, 10:09 AM

## 2017-09-03 NOTE — Progress Notes (Signed)
Physical Therapy Treatment Patient Details Name: Nicholas Thompson MRN: [id number] DOB: 23-Oct-1955 Today's Date: 09/03/2017    History of Present Illness 62yo male pt admitted for acute hospitalization status post L THR, anterior approach, 08/29/17; WBAT.    PT Comments    Pt in recliner, excited for therapy.  "I need to walk".  Pt presented with overall increased pain today.  Stated he had not had pain medication this am.  He declined offer stating he needed to eat first.  Offered to return later in am but pt declined stating he wanted to walk now.  Participated in exercises as described below but limited by pain and stiffness.  Pt struggled but was able to stand and ambulate around nursing unit x 1 with slow gait and frequent self initiated rest breaks.  Pt with occasional imbalances due to pain.  Remained highly motivated to finish session despite discomfort.  Encouragement and education provided.  Pt to call RN when breakfast arrives for pain medication.   Follow Up Recommendations  SNF     Equipment Recommendations  Rolling walker with 5" wheels;3in1 (PT)    Recommendations for Other Services       Precautions / Restrictions Precautions Precautions: Anterior Hip;Fall Restrictions Weight Bearing Restrictions: Yes LLE Weight Bearing: Weight bearing as tolerated    Mobility  Bed Mobility               General bed mobility comments: in recliner  Transfers Overall transfer level: Needs assistance Equipment used: Rolling walker (2 wheeled) Transfers: Sit to/from Stand              Ambulation/Gait Ambulation/Gait assistance: Min assist Gait Distance (Feet): 200 Feet Assistive device: Rolling walker (2 wheeled) Gait Pattern/deviations: Step-to pattern;Decreased step length - right;Decreased step length - left;Narrow base of support   Gait velocity interpretation: <1.8 ft/sec, indicate of risk for recurrent falls General Gait Details: self limiting WB on LLE today with  gait due to pain.  Vc's to inc BOS and rest as needed.   Stairs             Wheelchair Mobility    Modified Rankin (Stroke Patients Only)       Balance Overall balance assessment: Needs assistance Sitting-balance support: No upper extremity supported;Feet supported Sitting balance-[name]: Fair Sitting balance - Comments: leans right due to pain   Standing balance support: Bilateral upper extremity supported Standing balance-[name]: Fair Standing balance comment: heavy reliance on walker but no LOB noted                            Cognition Arousal/Alertness: Awake/alert Behavior During Therapy: WFL for tasks assessed/performed Overall Cognitive Status: Within Functional Limits for tasks assessed                                        Exercises Other Exercises Other Exercises: Seated LAQ AAROM x 5 and standing AROM hip/knee flexion x 5 limited by pain.    General Comments        Pertinent Vitals/Pain Pain Assessment: 0-10 Pain Score: 9  Pain Location: L hip - had not received pain medication this am but wanting to walk despite.  Offered to return later but declined. Pain Descriptors / Indicators: Aching;Grimacing;Operative site guarding Pain Intervention(s): Monitored during session    Home Living  Prior Function            PT Goals (current goals can now be found in the care plan section) Progress towards PT goals: Progressing toward goals    Frequency    BID      PT Plan Current plan remains appropriate    Co-evaluation              AM-PAC PT "6 Clicks" Daily Activity  Outcome Measure  Difficulty turning over in bed (including adjusting bedclothes, sheets and blankets)?: Unable Difficulty moving from lying on back to sitting on the side of the bed? : Unable Difficulty sitting down on and standing up from a chair with arms (e.g., wheelchair, bedside commode, etc,.)?:  Unable Help needed moving to and from a bed to chair (including a wheelchair)?: A Little Help needed walking in hospital room?: A Little Help needed climbing 3-5 steps with a railing? : A Lot 6 Click Score: 11    End of Session Equipment Utilized During Treatment: Gait belt Activity Tolerance: Patient limited by pain Patient left: in chair;with chair alarm set;with call bell/phone within reach   Pain - Right/Left: Left Pain - part of body: Hip     Time: 6761-9509 PT Time Calculation (min) (ACUTE ONLY): 24 min  Charges:  $Gait Training: 8-22 mins $Therapeutic Exercise: 8-22 mins                    G Codes:      Chesley Noon, PTA 09/03/17, 9:56 AM

## 2017-09-07 DIAGNOSIS — Z96642 Presence of left artificial hip joint: Secondary | ICD-10-CM | POA: Diagnosis not present

## 2017-09-07 DIAGNOSIS — K219 Gastro-esophageal reflux disease without esophagitis: Secondary | ICD-10-CM | POA: Diagnosis not present

## 2017-09-07 DIAGNOSIS — I10 Essential (primary) hypertension: Secondary | ICD-10-CM | POA: Diagnosis not present

## 2017-09-14 DIAGNOSIS — K219 Gastro-esophageal reflux disease without esophagitis: Secondary | ICD-10-CM | POA: Diagnosis not present

## 2017-09-14 DIAGNOSIS — I10 Essential (primary) hypertension: Secondary | ICD-10-CM | POA: Diagnosis not present

## 2017-09-14 DIAGNOSIS — Z96642 Presence of left artificial hip joint: Secondary | ICD-10-CM | POA: Diagnosis not present

## 2017-09-15 DIAGNOSIS — Z9181 History of falling: Secondary | ICD-10-CM | POA: Diagnosis not present

## 2017-09-15 DIAGNOSIS — F5101 Primary insomnia: Secondary | ICD-10-CM | POA: Diagnosis not present

## 2017-09-15 DIAGNOSIS — K5900 Constipation, unspecified: Secondary | ICD-10-CM | POA: Diagnosis not present

## 2017-09-15 DIAGNOSIS — Z96642 Presence of left artificial hip joint: Secondary | ICD-10-CM | POA: Diagnosis not present

## 2017-09-15 DIAGNOSIS — E785 Hyperlipidemia, unspecified: Secondary | ICD-10-CM | POA: Diagnosis not present

## 2017-09-15 DIAGNOSIS — I129 Hypertensive chronic kidney disease with stage 1 through stage 4 chronic kidney disease, or unspecified chronic kidney disease: Secondary | ICD-10-CM | POA: Diagnosis not present

## 2017-09-15 DIAGNOSIS — Z7901 Long term (current) use of anticoagulants: Secondary | ICD-10-CM | POA: Diagnosis not present

## 2017-09-15 DIAGNOSIS — Z85828 Personal history of other malignant neoplasm of skin: Secondary | ICD-10-CM | POA: Diagnosis not present

## 2017-09-15 DIAGNOSIS — Z85528 Personal history of other malignant neoplasm of kidney: Secondary | ICD-10-CM | POA: Diagnosis not present

## 2017-09-15 DIAGNOSIS — K219 Gastro-esophageal reflux disease without esophagitis: Secondary | ICD-10-CM | POA: Diagnosis not present

## 2017-09-15 DIAGNOSIS — N189 Chronic kidney disease, unspecified: Secondary | ICD-10-CM | POA: Diagnosis not present

## 2017-09-15 DIAGNOSIS — Z471 Aftercare following joint replacement surgery: Secondary | ICD-10-CM | POA: Diagnosis not present

## 2017-09-15 DIAGNOSIS — M1990 Unspecified osteoarthritis, unspecified site: Secondary | ICD-10-CM | POA: Diagnosis not present

## 2017-09-19 DIAGNOSIS — K5900 Constipation, unspecified: Secondary | ICD-10-CM | POA: Diagnosis not present

## 2017-09-19 DIAGNOSIS — N189 Chronic kidney disease, unspecified: Secondary | ICD-10-CM | POA: Diagnosis not present

## 2017-09-19 DIAGNOSIS — Z7901 Long term (current) use of anticoagulants: Secondary | ICD-10-CM | POA: Diagnosis not present

## 2017-09-19 DIAGNOSIS — F5101 Primary insomnia: Secondary | ICD-10-CM | POA: Diagnosis not present

## 2017-09-19 DIAGNOSIS — M1990 Unspecified osteoarthritis, unspecified site: Secondary | ICD-10-CM | POA: Diagnosis not present

## 2017-09-19 DIAGNOSIS — K219 Gastro-esophageal reflux disease without esophagitis: Secondary | ICD-10-CM | POA: Diagnosis not present

## 2017-09-19 DIAGNOSIS — I129 Hypertensive chronic kidney disease with stage 1 through stage 4 chronic kidney disease, or unspecified chronic kidney disease: Secondary | ICD-10-CM | POA: Diagnosis not present

## 2017-09-19 DIAGNOSIS — Z85828 Personal history of other malignant neoplasm of skin: Secondary | ICD-10-CM | POA: Diagnosis not present

## 2017-09-19 DIAGNOSIS — Z9181 History of falling: Secondary | ICD-10-CM | POA: Diagnosis not present

## 2017-09-19 DIAGNOSIS — Z85528 Personal history of other malignant neoplasm of kidney: Secondary | ICD-10-CM | POA: Diagnosis not present

## 2017-09-19 DIAGNOSIS — Z471 Aftercare following joint replacement surgery: Secondary | ICD-10-CM | POA: Diagnosis not present

## 2017-09-19 DIAGNOSIS — E785 Hyperlipidemia, unspecified: Secondary | ICD-10-CM | POA: Diagnosis not present

## 2017-09-19 DIAGNOSIS — Z96642 Presence of left artificial hip joint: Secondary | ICD-10-CM | POA: Diagnosis not present

## 2017-09-21 ENCOUNTER — Ambulatory Visit: Payer: BLUE CROSS/BLUE SHIELD | Admitting: Urology

## 2017-09-21 DIAGNOSIS — Z96642 Presence of left artificial hip joint: Secondary | ICD-10-CM | POA: Diagnosis not present

## 2017-09-21 DIAGNOSIS — F5101 Primary insomnia: Secondary | ICD-10-CM | POA: Diagnosis not present

## 2017-09-21 DIAGNOSIS — E785 Hyperlipidemia, unspecified: Secondary | ICD-10-CM | POA: Diagnosis not present

## 2017-09-21 DIAGNOSIS — I129 Hypertensive chronic kidney disease with stage 1 through stage 4 chronic kidney disease, or unspecified chronic kidney disease: Secondary | ICD-10-CM | POA: Diagnosis not present

## 2017-09-21 DIAGNOSIS — Z9181 History of falling: Secondary | ICD-10-CM | POA: Diagnosis not present

## 2017-09-21 DIAGNOSIS — K219 Gastro-esophageal reflux disease without esophagitis: Secondary | ICD-10-CM | POA: Diagnosis not present

## 2017-09-21 DIAGNOSIS — M1990 Unspecified osteoarthritis, unspecified site: Secondary | ICD-10-CM | POA: Diagnosis not present

## 2017-09-21 DIAGNOSIS — Z7901 Long term (current) use of anticoagulants: Secondary | ICD-10-CM | POA: Diagnosis not present

## 2017-09-21 DIAGNOSIS — Z85528 Personal history of other malignant neoplasm of kidney: Secondary | ICD-10-CM | POA: Diagnosis not present

## 2017-09-21 DIAGNOSIS — N189 Chronic kidney disease, unspecified: Secondary | ICD-10-CM | POA: Diagnosis not present

## 2017-09-21 DIAGNOSIS — K5900 Constipation, unspecified: Secondary | ICD-10-CM | POA: Diagnosis not present

## 2017-09-21 DIAGNOSIS — Z471 Aftercare following joint replacement surgery: Secondary | ICD-10-CM | POA: Diagnosis not present

## 2017-09-21 DIAGNOSIS — Z85828 Personal history of other malignant neoplasm of skin: Secondary | ICD-10-CM | POA: Diagnosis not present

## 2017-09-23 ENCOUNTER — Encounter: Payer: Self-pay | Admitting: *Deleted

## 2017-09-23 DIAGNOSIS — I129 Hypertensive chronic kidney disease with stage 1 through stage 4 chronic kidney disease, or unspecified chronic kidney disease: Secondary | ICD-10-CM | POA: Diagnosis not present

## 2017-09-23 DIAGNOSIS — M1990 Unspecified osteoarthritis, unspecified site: Secondary | ICD-10-CM | POA: Diagnosis not present

## 2017-09-23 DIAGNOSIS — Z9181 History of falling: Secondary | ICD-10-CM | POA: Diagnosis not present

## 2017-09-23 DIAGNOSIS — Z85528 Personal history of other malignant neoplasm of kidney: Secondary | ICD-10-CM | POA: Diagnosis not present

## 2017-09-23 DIAGNOSIS — Z7901 Long term (current) use of anticoagulants: Secondary | ICD-10-CM | POA: Diagnosis not present

## 2017-09-23 DIAGNOSIS — Z96642 Presence of left artificial hip joint: Secondary | ICD-10-CM | POA: Diagnosis not present

## 2017-09-23 DIAGNOSIS — Z471 Aftercare following joint replacement surgery: Secondary | ICD-10-CM | POA: Diagnosis not present

## 2017-09-23 DIAGNOSIS — N189 Chronic kidney disease, unspecified: Secondary | ICD-10-CM | POA: Diagnosis not present

## 2017-09-23 DIAGNOSIS — F5101 Primary insomnia: Secondary | ICD-10-CM | POA: Diagnosis not present

## 2017-09-23 DIAGNOSIS — E785 Hyperlipidemia, unspecified: Secondary | ICD-10-CM | POA: Diagnosis not present

## 2017-09-23 DIAGNOSIS — K5900 Constipation, unspecified: Secondary | ICD-10-CM | POA: Diagnosis not present

## 2017-09-23 DIAGNOSIS — Z85828 Personal history of other malignant neoplasm of skin: Secondary | ICD-10-CM | POA: Diagnosis not present

## 2017-09-23 DIAGNOSIS — K219 Gastro-esophageal reflux disease without esophagitis: Secondary | ICD-10-CM | POA: Diagnosis not present

## 2017-09-26 ENCOUNTER — Ambulatory Visit: Payer: BLUE CROSS/BLUE SHIELD | Admitting: Family Medicine

## 2017-09-26 ENCOUNTER — Encounter: Payer: Self-pay | Admitting: Family Medicine

## 2017-09-26 VITALS — BP 140/60 | HR 58 | Temp 98.1°F | Ht 74.0 in | Wt 245.5 lb

## 2017-09-26 DIAGNOSIS — C649 Malignant neoplasm of unspecified kidney, except renal pelvis: Secondary | ICD-10-CM | POA: Diagnosis not present

## 2017-09-26 DIAGNOSIS — Z96641 Presence of right artificial hip joint: Secondary | ICD-10-CM | POA: Diagnosis not present

## 2017-09-26 LAB — BASIC METABOLIC PANEL
BUN: 12 mg/dL (ref 6–23)
CHLORIDE: 103 meq/L (ref 96–112)
CO2: 30 mEq/L (ref 19–32)
Calcium: 9.8 mg/dL (ref 8.4–10.5)
Creatinine, Ser: 1 mg/dL (ref 0.40–1.50)
GFR: 80.2 mL/min (ref >60.00)
Glucose, Bld: 109 mg/dL — ABNORMAL HIGH (ref 70–99)
POTASSIUM: 4.6 meq/L (ref 3.5–5.1)
SODIUM: 140 meq/L (ref 135–145)

## 2017-09-26 LAB — CBC WITH DIFFERENTIAL/PLATELET
BASOS PCT: 0.4 % (ref 0.0–3.0)
Basophils Absolute: 0 10*3/uL (ref 0.0–0.1)
EOS PCT: 1.3 % (ref 0.0–5.0)
Eosinophils Absolute: 0.1 10*3/uL (ref 0.0–0.7)
HEMATOCRIT: 41.4 % (ref 39.0–52.0)
HEMOGLOBIN: 14.2 g/dL (ref 13.0–17.0)
LYMPHS PCT: 22.8 % (ref 12.0–46.0)
Lymphs Abs: 1.9 10*3/uL (ref 0.7–4.0)
MCHC: 34.2 g/dL (ref 30.0–36.0)
MCV: 90.7 fl (ref 78.0–100.0)
MONO ABS: 0.6 10*3/uL (ref 0.1–1.0)
MONOS PCT: 7.1 % (ref 3.0–12.0)
Neutro Abs: 5.7 10*3/uL (ref 1.4–7.7)
Neutrophils Relative %: 68.4 % (ref 43.0–77.0)
Platelets: 303 10*3/uL (ref 150.0–400.0)
RBC: 4.56 Mil/uL (ref 4.22–5.81)
RDW: 13.2 % (ref 11.5–15.5)
WBC: 8.4 10*3/uL (ref 4.0–10.5)

## 2017-09-26 MED ORDER — KETOCONAZOLE 2 % EX CREA: 1.0000 "application " | TOPICAL_CREAM | Freq: Every day | CUTANEOUS | 1 refills | Status: DC | PRN

## 2017-09-26 NOTE — Progress Notes (Signed)
Dr. Frederico Hamman T. Dorris Pierre, MD, [facility] [place], [address] Phone: [phone] Fax: [phone]  09/26/2017  Patient: Nicholas Thompson, MRN: [id number], DOB: Apr 17, 1955, 62 y.o.  Primary Physician:  Owens Loffler, MD   Chief Complaint  Patient presents with  . Hospitalization Follow-up    Rebab from Hip Replacement   Subjective:   Nicholas Thompson is a 62 y.o. very pleasant male patient who presents with the following:  Post SnF f/u s/p hip replacement.   Original pain, some soreness.  08/30/2017 - DOS. Dr. Rudene Christians.  Doing well with tylenol only Feeling much better and with some incisional pain  Bmp, cbc  Past Medical History, Surgical History, Social History, Family History, Problem List, Medications, and Allergies have been reviewed and updated if relevant.  Patient Active Problem List   Diagnosis Date Noted  . Status post total hip replacement, left 08/30/2017  . Left hip pain 05/18/2017  . Renal cell carcinoma of right kidney ([place]) 07/30/2015  . S/p RIGHT nephrectomy 07/30/2015  . COLONIC POLYPS, HX OF 08/07/2008  . HYPERLIPIDEMIA 05/18/2007  . HYPERTENSION 05/18/2007  . HYPERGLYCEMIA 05/18/2007  . CARCINOMA, BASAL CELL, HX OF 05/18/2007    Past Medical History:  Diagnosis Date  . Arthritis   . Basal cell carcinoma    bASAL CELL  . Chronic kidney disease   . GERD (gastroesophageal reflux disease)   . History of colonic polyps   . History of kidney stones   . Hyperlipidemia   . Nephrolithiasis   . Other and unspecified hyperlipidemia   . PONV (postoperative nausea and vomiting)   . Renal cell carcinoma of right kidney ([place]) 07/30/2015  . S/p nephrectomy 07/30/2015  . Unspecified essential hypertension     Past Surgical History:  Procedure Laterality Date  . arthroscopy of knee Right 1989  . CYSTOSCOPY WITH STENT PLACEMENT Left 06/11/2015   Procedure: CYSTOSCOPY WITH STENT PLACEMENT/ bilateral  retrograde pyelogram;  Surgeon: [name], MD;  Location: [facility] ORS;  Service: Urology;  Laterality: Left;  . [place] 2001&2006   3 times  . LAPAROSCOPIC NEPHRECTOMY, HAND ASSISTED Right 07/09/2015   Procedure: HAND ASSISTED LAPAROSCOPIC NEPHRECTOMY;  Surgeon: [name], MD;  Location: [facility] ORS;  Service: Urology;  Laterality: Right;  . MOHS SURGERY Left 04/2015   [place], [facility]  . SHOULDER ARTHROSCOPY W/ SUBACROMIAL DECOMPRESSION AND DISTAL CLAVICLE EXCISION Left 08-27-2007  . TOTAL HIP ARTHROPLASTY Left 08/30/2017   Procedure: TOTAL HIP ARTHROPLASTY ANTERIOR APPROACH;  Surgeon: Hessie Knows, MD;  Location: [facility] ORS;  Service: Orthopedics;  Laterality: Left;  . URETEROSCOPY WITH HOLMIUM LASER LITHOTRIPSY Bilateral 06/11/2015   Procedure: URETEROSCOPY WITH HOLMIUM LASER LITHOTRIPSY;  Surgeon: [name], MD;  Location: [facility] ORS;  Service: Urology;  Laterality: Bilateral;  . VASECTOMY      Social History   Socioeconomic History  . Marital status: Married    Spouse name: Not on file  . Number of children: 2  . Years of education: Not on file  . Highest education level: Not on file  Occupational History  . Occupation: Geographical information systems officer  . Financial resource strain: Not on file  . Food insecurity:    Worry: Not on file    Inability: Not on file  . Transportation needs:    Medical: Not on file    Non-medical: Not on file  Tobacco Use  . Smoking status: Former Smoker  Types: Cigars  . Smokeless tobacco: Never Used  Substance and Sexual Activity  . Alcohol use: Yes    Alcohol/week: 0.0 oz    Comment: rarely  . Drug use: No  . Sexual activity: Not on file  Lifestyle  . Physical activity:    Days per week: Not on file    Minutes per session: Not on file  . Stress: Not on file  Relationships  . Social connections:    Talks on phone: Not on file    Gets together: Not on file    Attends religious service: Not on file    Active member of  club or organization: Not on file    Attends meetings of clubs or organizations: Not on file    Relationship status: Not on file  . Intimate partner violence:    Fear of current or ex partner: Not on file    Emotionally abused: Not on file    Physically abused: Not on file    Forced sexual activity: Not on file  Other Topics Concern  . Not on file  Social History Narrative  . Not on file    Family History  Problem Relation Age of Onset  . Cancer Mother        bladder  . Diabetes Father   . Hyperlipidemia Father   . Coronary artery disease Father   . Cancer Paternal Uncle        brain  . Stroke Maternal Grandfather   . Cancer Paternal Grandfather        lung  . Prostate cancer Neg Hx     Allergies  Allergen Reactions  . Vicodin [Hydrocodone-Acetaminophen] Nausea And Vomiting    Medication list reviewed and updated in full in [place].   GEN: No acute illnesses, no fevers, chills. GI: No n/v/d, eating normally Pulm: No SOB Interactive and getting along well at home.  Otherwise, ROS is as per the HPI.  Objective:   BP 140/60   Pulse (!) 58   Temp 98.1 F (36.7 C) (Oral)   Ht 6\' 2"  (1.88 m)   Wt 245 lb 8 oz (111.4 kg)   BMI 31.52 kg/m   GEN: WDWN, NAD, Non-toxic, A & O x 3 HEENT: Atraumatic, Normocephalic. Neck supple. No masses, No LAD. Ears and Nose: No external deformity. CV: RRR, No M/G/R. No JVD. No thrill. No extra heart sounds. PULM: CTA B, no wheezes, crackles, rhonchi. No retractions. No resp. distress. No accessory muscle use. EXTR: No c/c/e NEURO Normal gait.  PSYCH: Normally interactive. Conversant. Not depressed or anxious appearing.  Calm demeanor.   Laboratory and Imaging Data:  Assessment and Plan:   History of right hip replacement - Plan: Basic metabolic panel, CBC with Differential/Platelet  Renal cell carcinoma, unspecified laterality ([place]) - Plan: Basic metabolic panel, CBC with Differential/Platelet  Doing well, cont  with rehab and f/u ortho  Follow-up: No follow-ups on file.  Meds ordered this encounter  Medications  . ketoconazole (NIZORAL) 2 % cream    Sig: Apply 1 application topically daily as needed for irritation.    Dispense:  30 g    Refill:  1   Orders Placed This Encounter  Procedures  . Basic metabolic panel  . CBC with Differential/Platelet    Signed,  Frederico Hamman T. Daven Pinckney, MD   Allergies as of 09/26/2017      Reactions   Vicodin [hydrocodone-acetaminophen] Nausea And Vomiting      Medication List  Accurate as of 09/26/17  9:53 AM. Always use your most recent med list.          acetaminophen 500 MG tablet Commonly known as:  TYLENOL Take 1,000 mg by mouth every 6 (six) hours as needed.   docusate sodium 100 MG capsule Commonly known as:  COLACE Take 1 capsule (100 mg total) by mouth 2 (two) times daily.   enalapril 20 MG tablet Commonly known as:  VASOTEC TAKE TWO TABLETS BY MOUTH ONCE DAILY   ketoconazole 2 % cream Commonly known as:  NIZORAL Apply 1 application topically daily as needed for irritation.   Melatonin 3 MG Tabs Take 12 mg by mouth at bedtime as needed (sleep).   omeprazole 20 MG capsule Commonly known as:  PRILOSEC Take 1 capsule (20 mg total) by mouth daily.   traMADol 50 MG tablet Commonly known as:  ULTRAM Take 1 tablet (50 mg total) by mouth every 6 (six) hours.

## 2017-09-27 DIAGNOSIS — E785 Hyperlipidemia, unspecified: Secondary | ICD-10-CM | POA: Diagnosis not present

## 2017-09-27 DIAGNOSIS — Z9181 History of falling: Secondary | ICD-10-CM | POA: Diagnosis not present

## 2017-09-27 DIAGNOSIS — M1990 Unspecified osteoarthritis, unspecified site: Secondary | ICD-10-CM | POA: Diagnosis not present

## 2017-09-27 DIAGNOSIS — Z96642 Presence of left artificial hip joint: Secondary | ICD-10-CM | POA: Diagnosis not present

## 2017-09-27 DIAGNOSIS — K219 Gastro-esophageal reflux disease without esophagitis: Secondary | ICD-10-CM | POA: Diagnosis not present

## 2017-09-27 DIAGNOSIS — K5900 Constipation, unspecified: Secondary | ICD-10-CM | POA: Diagnosis not present

## 2017-09-27 DIAGNOSIS — Z85828 Personal history of other malignant neoplasm of skin: Secondary | ICD-10-CM | POA: Diagnosis not present

## 2017-09-27 DIAGNOSIS — F5101 Primary insomnia: Secondary | ICD-10-CM | POA: Diagnosis not present

## 2017-09-27 DIAGNOSIS — N189 Chronic kidney disease, unspecified: Secondary | ICD-10-CM | POA: Diagnosis not present

## 2017-09-27 DIAGNOSIS — I129 Hypertensive chronic kidney disease with stage 1 through stage 4 chronic kidney disease, or unspecified chronic kidney disease: Secondary | ICD-10-CM | POA: Diagnosis not present

## 2017-09-27 DIAGNOSIS — Z7901 Long term (current) use of anticoagulants: Secondary | ICD-10-CM | POA: Diagnosis not present

## 2017-09-27 DIAGNOSIS — Z471 Aftercare following joint replacement surgery: Secondary | ICD-10-CM | POA: Diagnosis not present

## 2017-09-27 DIAGNOSIS — Z85528 Personal history of other malignant neoplasm of kidney: Secondary | ICD-10-CM | POA: Diagnosis not present

## 2017-09-29 DIAGNOSIS — Z85528 Personal history of other malignant neoplasm of kidney: Secondary | ICD-10-CM | POA: Diagnosis not present

## 2017-09-29 DIAGNOSIS — Z9181 History of falling: Secondary | ICD-10-CM | POA: Diagnosis not present

## 2017-09-29 DIAGNOSIS — E785 Hyperlipidemia, unspecified: Secondary | ICD-10-CM | POA: Diagnosis not present

## 2017-09-29 DIAGNOSIS — K219 Gastro-esophageal reflux disease without esophagitis: Secondary | ICD-10-CM | POA: Diagnosis not present

## 2017-09-29 DIAGNOSIS — Z471 Aftercare following joint replacement surgery: Secondary | ICD-10-CM | POA: Diagnosis not present

## 2017-09-29 DIAGNOSIS — M1990 Unspecified osteoarthritis, unspecified site: Secondary | ICD-10-CM | POA: Diagnosis not present

## 2017-09-29 DIAGNOSIS — Z85828 Personal history of other malignant neoplasm of skin: Secondary | ICD-10-CM | POA: Diagnosis not present

## 2017-09-29 DIAGNOSIS — I129 Hypertensive chronic kidney disease with stage 1 through stage 4 chronic kidney disease, or unspecified chronic kidney disease: Secondary | ICD-10-CM | POA: Diagnosis not present

## 2017-09-29 DIAGNOSIS — Z96642 Presence of left artificial hip joint: Secondary | ICD-10-CM | POA: Diagnosis not present

## 2017-09-29 DIAGNOSIS — F5101 Primary insomnia: Secondary | ICD-10-CM | POA: Diagnosis not present

## 2017-09-29 DIAGNOSIS — Z7901 Long term (current) use of anticoagulants: Secondary | ICD-10-CM | POA: Diagnosis not present

## 2017-09-29 DIAGNOSIS — N189 Chronic kidney disease, unspecified: Secondary | ICD-10-CM | POA: Diagnosis not present

## 2017-09-29 DIAGNOSIS — K5900 Constipation, unspecified: Secondary | ICD-10-CM | POA: Diagnosis not present

## 2017-09-30 DIAGNOSIS — N189 Chronic kidney disease, unspecified: Secondary | ICD-10-CM | POA: Diagnosis not present

## 2017-09-30 DIAGNOSIS — Z471 Aftercare following joint replacement surgery: Secondary | ICD-10-CM | POA: Diagnosis not present

## 2017-09-30 DIAGNOSIS — K5900 Constipation, unspecified: Secondary | ICD-10-CM | POA: Diagnosis not present

## 2017-09-30 DIAGNOSIS — Z9181 History of falling: Secondary | ICD-10-CM | POA: Diagnosis not present

## 2017-09-30 DIAGNOSIS — I129 Hypertensive chronic kidney disease with stage 1 through stage 4 chronic kidney disease, or unspecified chronic kidney disease: Secondary | ICD-10-CM | POA: Diagnosis not present

## 2017-09-30 DIAGNOSIS — Z85528 Personal history of other malignant neoplasm of kidney: Secondary | ICD-10-CM | POA: Diagnosis not present

## 2017-09-30 DIAGNOSIS — Z85828 Personal history of other malignant neoplasm of skin: Secondary | ICD-10-CM | POA: Diagnosis not present

## 2017-09-30 DIAGNOSIS — M1990 Unspecified osteoarthritis, unspecified site: Secondary | ICD-10-CM | POA: Diagnosis not present

## 2017-09-30 DIAGNOSIS — F5101 Primary insomnia: Secondary | ICD-10-CM | POA: Diagnosis not present

## 2017-09-30 DIAGNOSIS — K219 Gastro-esophageal reflux disease without esophagitis: Secondary | ICD-10-CM | POA: Diagnosis not present

## 2017-09-30 DIAGNOSIS — Z96642 Presence of left artificial hip joint: Secondary | ICD-10-CM | POA: Diagnosis not present

## 2017-09-30 DIAGNOSIS — E785 Hyperlipidemia, unspecified: Secondary | ICD-10-CM | POA: Diagnosis not present

## 2017-09-30 DIAGNOSIS — Z7901 Long term (current) use of anticoagulants: Secondary | ICD-10-CM | POA: Diagnosis not present

## 2017-10-07 ENCOUNTER — Other Ambulatory Visit: Payer: Self-pay | Admitting: Family Medicine

## 2017-10-12 DIAGNOSIS — Z96642 Presence of left artificial hip joint: Secondary | ICD-10-CM | POA: Diagnosis not present

## 2017-11-16 DIAGNOSIS — Z471 Aftercare following joint replacement surgery: Secondary | ICD-10-CM | POA: Diagnosis not present

## 2018-01-01 NOTE — Progress Notes (Deleted)
Dr. Frederico Hamman T. Javarian Jakubiak, MD, [facility] [place], [address] Phone: [phone] Fax: [phone]  01/02/2018  Patient: Nicholas Thompson, MRN: [id number], DOB: 05-Sep-1955, 62 y.o.  Primary Physician:  Owens Loffler, MD   No chief complaint on file.  Subjective:   Nicholas Thompson is a 62 y.o. very pleasant male patient who presents with the following:  Back pain:   Past Medical History, Surgical History, Social History, Family History, Problem List, Medications, and Allergies have been reviewed and updated if relevant.  Patient Active Problem List   Diagnosis Date Noted  . Status post total hip replacement, left 08/30/2017  . Left hip pain 05/18/2017  . Renal cell carcinoma of right kidney ([place]) 07/30/2015  . S/p RIGHT nephrectomy 07/30/2015  . COLONIC POLYPS, HX OF 08/07/2008  . HYPERLIPIDEMIA 05/18/2007  . HYPERTENSION 05/18/2007  . HYPERGLYCEMIA 05/18/2007  . CARCINOMA, BASAL CELL, HX OF 05/18/2007    Past Medical History:  Diagnosis Date  . Arthritis   . Basal cell carcinoma    bASAL CELL  . Chronic kidney disease   . GERD (gastroesophageal reflux disease)   . History of colonic polyps   . History of kidney stones   . Hyperlipidemia   . Nephrolithiasis   . Other and unspecified hyperlipidemia   . PONV (postoperative nausea and vomiting)   . Renal cell carcinoma of right kidney ([place]) 07/30/2015  . S/p nephrectomy 07/30/2015  . Unspecified essential hypertension     Past Surgical History:  Procedure Laterality Date  . arthroscopy of knee Right 1989  . CYSTOSCOPY WITH STENT PLACEMENT Left 06/11/2015   Procedure: CYSTOSCOPY WITH STENT PLACEMENT/ bilateral retrograde pyelogram;  Surgeon: [name], MD;  Location: [facility];  Service: Urology;  Laterality: Left;  . [place] 2001&2006   3 times  . LAPAROSCOPIC NEPHRECTOMY, HAND ASSISTED Right 07/09/2015   Procedure: HAND ASSISTED  LAPAROSCOPIC NEPHRECTOMY;  Surgeon: [name], MD;  Location: [facility];  Service: Urology;  Laterality: Right;  . MOHS SURGERY Left 04/2015   [facility]ar, [facility]  . SHOULDER ARTHROSCOPY W/ SUBACROMIAL DECOMPRESSION AND DISTAL CLAVICLE EXCISION Left 08-27-2007  . TOTAL HIP ARTHROPLASTY Left 08/30/2017   Procedure: TOTAL HIP ARTHROPLASTY ANTERIOR APPROACH;  Surgeon: Hessie Knows, MD;  Location: [facility];  Service: Orthopedics;  Laterality: Left;  . URETEROSCOPY WITH HOLMIUM LASER LITHOTRIPSY Bilateral 06/11/2015   Procedure: URETEROSCOPY WITH HOLMIUM LASER LITHOTRIPSY;  Surgeon: [name], MD;  Location: [facility];  Service: Urology;  Laterality: Bilateral;  . VASECTOMY      Social History   Socioeconomic History  . Marital status: Married    Spouse name: Not on file  . Number of children: 2  . Years of education: Not on file  . Highest education level: Not on file  Occupational History  . Occupation: Geographical information systems officer  . Financial resource strain: Not on file  . Food insecurity:    Worry: Not on file    Inability: Not on file  . Transportation needs:    Medical: Not on file    Non-medical: Not on file  Tobacco Use  . Smoking status: Former Smoker    Types: Cigars  . Smokeless tobacco: Never Used  Substance and Sexual Activity  . Alcohol use: Yes    Alcohol/week: 0.0 standard drinks    Comment: rarely  . Drug use: No  . Sexual activity: Not on file  Lifestyle  . Physical activity:    Days per week: Not on file    Minutes per session: Not on file  . Stress: Not on file  Relationships  . Social connections:    Talks on phone: Not on file    Gets together: Not on file    Attends religious service: Not on file    Active member of club or organization: Not on file    Attends meetings of clubs or organizations: Not on file    Relationship status: Not on file  . Intimate partner violence:    Fear of current or ex partner: Not on file    Emotionally abused: Not on  file    Physically abused: Not on file    Forced sexual activity: Not on file  Other Topics Concern  . Not on file  Social History Narrative  . Not on file    Family History  Problem Relation Age of Onset  . Cancer Mother        bladder  . Diabetes Father   . Hyperlipidemia Father   . Coronary artery disease Father   . Cancer Paternal Uncle        brain  . Stroke Maternal Grandfather   . Cancer Paternal Grandfather        lung  . Prostate cancer Neg Hx     Allergies  Allergen Reactions  . Vicodin [Hydrocodone-Acetaminophen] Nausea And Vomiting    Medication list reviewed and updated in full in [place].  GEN: No fevers, chills. Nontoxic. Primarily MSK c/o today. MSK: Detailed in the HPI GI: tolerating PO intake without difficulty Neuro: No numbness, parasthesias, or tingling associated. Otherwise the pertinent positives of the ROS are noted above.   Objective:   There were no vitals taken for this visit.  ***  Radiology: No results found.  Assessment and Plan:   ***

## 2018-01-02 ENCOUNTER — Ambulatory Visit: Payer: BLUE CROSS/BLUE SHIELD | Admitting: Family Medicine

## 2018-01-03 ENCOUNTER — Encounter: Payer: Self-pay | Admitting: Internal Medicine

## 2018-01-03 ENCOUNTER — Ambulatory Visit: Payer: BLUE CROSS/BLUE SHIELD | Admitting: Internal Medicine

## 2018-01-03 VITALS — BP 160/86 | HR 60 | Ht 74.0 in | Wt 253.0 lb

## 2018-01-03 DIAGNOSIS — K219 Gastro-esophageal reflux disease without esophagitis: Secondary | ICD-10-CM

## 2018-01-03 DIAGNOSIS — Z1211 Encounter for screening for malignant neoplasm of colon: Secondary | ICD-10-CM | POA: Diagnosis not present

## 2018-01-03 DIAGNOSIS — R1310 Dysphagia, unspecified: Secondary | ICD-10-CM

## 2018-01-03 DIAGNOSIS — K222 Esophageal obstruction: Secondary | ICD-10-CM

## 2018-01-03 MED ORDER — PEG-KCL-NACL-NASULF-NA ASC-C 140 G PO SOLR: 1.0000 | Freq: Once | ORAL | 0 refills | Status: AC

## 2018-01-03 NOTE — Patient Instructions (Signed)

## 2018-01-03 NOTE — Progress Notes (Signed)
HISTORY OF PRESENT ILLNESS:  Nicholas Thompson is a 62 y.o. male with past medical history as listed below who presents today regarding routine colon cancer screening and recurrent problems with dysphasia.  I have not seen the patient in many years.  His last complete colonoscopy was performed September 18, 2004.  Examination was normal except for a diminutive sigmoid colon polyp which was hyperplastic.  He has no family history of colon cancer.  No lower GI complaints.  He does complain of intermittent solid food dysphasia worsening over the past year.  For chronic GERD he takes omeprazole 20 mg daily.  On medication no reflux symptoms.  Off medication significant reflux symptoms within 1 day.  His last upper endoscopy was performed June 2006.  At that time he was found to have a distal esophageal stricture which was dilated to 18 mm.  Patient was found to have incidental renal cell carcinoma for which she underwent resection with no evidence of disease.  Review of outside laboratories from July 2019 finds normal CBC with hemoglobin 14.2.  Outside x-rays include CT scan of the abdomen and pelvis with contrast June 2018.  No significant abnormalities or evidence of metastatic disease.  REVIEW OF SYSTEMS:  All non-GI ROS negative unless otherwise stated in the HPI except for excessive urination and fatigue  Past Medical History:  Diagnosis Date  . Arthritis   . Basal cell carcinoma    bASAL CELL  . Chronic kidney disease   . GERD (gastroesophageal reflux disease)   . History of colonic polyps   . History of kidney stones   . Hyperlipidemia   . Nephrolithiasis   . Other and unspecified hyperlipidemia   . PONV (postoperative nausea and vomiting)   . Renal cell carcinoma of right kidney ([place]) 07/30/2015  . S/p nephrectomy 07/30/2015  . Unspecified essential hypertension     Past Surgical History:  Procedure Laterality Date  . arthroscopy of knee Right 1989  . CYSTOSCOPY WITH STENT PLACEMENT Left  06/11/2015   Procedure: CYSTOSCOPY WITH STENT PLACEMENT/ bilateral retrograde pyelogram;  Surgeon: [name], MD;  Location: [facility];  Service: Urology;  Laterality: Left;  . [place] 2001&2006   3 times  . LAPAROSCOPIC NEPHRECTOMY, HAND ASSISTED Right 07/09/2015   Procedure: HAND ASSISTED LAPAROSCOPIC NEPHRECTOMY;  Surgeon: [name], MD;  Location: [facility];  Service: Urology;  Laterality: Right;  . MOHS SURGERY Left 04/2015   Ear, [facility]  . SHOULDER ARTHROSCOPY W/ SUBACROMIAL DECOMPRESSION AND DISTAL CLAVICLE EXCISION Left 08-27-2007  . TOTAL HIP ARTHROPLASTY Left 08/30/2017   Procedure: TOTAL HIP ARTHROPLASTY ANTERIOR APPROACH;  Surgeon: Hessie Knows, MD;  Location: [facility];  Service: Orthopedics;  Laterality: Left;  . URETEROSCOPY WITH HOLMIUM LASER LITHOTRIPSY Bilateral 06/11/2015   Procedure: URETEROSCOPY WITH HOLMIUM LASER LITHOTRIPSY;  Surgeon: [name], MD;  Location: [facility];  Service: Urology;  Laterality: Bilateral;  . VASECTOMY      Social History Nicholas Thompson  reports that he has quit smoking. His smoking use included cigars. He has never used smokeless tobacco. He reports that he drinks alcohol. He reports that he does not use drugs.  family history includes Cancer in his mother, paternal grandfather, and paternal uncle; Coronary artery disease in his father; Diabetes in his father; Hyperlipidemia in his father; Stroke in his maternal grandfather; Stroke (age of onset: 44) in his mother.  Allergies  Allergen Reactions  . Vicodin [Hydrocodone-Acetaminophen] Nausea And Vomiting  PHYSICAL EXAMINATION: Vital signs: BP (!) 160/86   Pulse 60   Ht 6\' 2"  (1.88 m)   Wt 253 lb (114.8 kg)   BMI 32.48 kg/m   Constitutional: Pleasant, generally well-appearing, no acute distress Psychiatric: alert and oriented x3, cooperative Eyes: extraocular movements intact, anicteric, conjunctiva pink Mouth: oral pharynx moist, no  lesions Neck: supple no lymphadenopathy Cardiovascular: heart regular rate and rhythm, no murmur Lungs: clear to auscultation bilaterally Abdomen: soft, nontender, nondistended, no obvious ascites, no peritoneal signs, normal bowel sounds, no organomegaly Rectal: Deferred until colonoscopy Extremities: no clubbing, cyanosis, or lower extremity edema bilaterally Skin: no lesions on visible extremities Neuro: No focal deficits.  Cranial nerves intact  ASSESSMENT:  1.  GERD.  Patient requires daily PPI to control symptoms 2.  Intermittent solid food dysphagia.  Worsening over the past year.  Likely secondary to recurrent peptic stricture 3.  History of peptic stricture requiring dilation.  Last performed 2006 4.  Colon cancer screening.  Average risk.  Last examination 2006- for neoplasia  PLAN:  1.  Reflux precautions 2.  Continue omeprazole 20 mg daily 3.  Schedule upper endoscopy with esophageal dilation.The nature of the procedure, as well as the risks, benefits, and alternatives were carefully and thoroughly reviewed with the patient. Ample time for discussion and questions allowed. The patient understood, was satisfied, and agreed to proceed. 4.  Schedule screening colonoscopy.The nature of the procedure, as well as the risks, benefits, and alternatives were carefully and thoroughly reviewed with the patient. Ample time for discussion and questions allowed. The patient understood, was satisfied, and agreed to proceed.

## 2018-01-11 ENCOUNTER — Ambulatory Visit: Payer: BLUE CROSS/BLUE SHIELD | Admitting: Family Medicine

## 2018-01-11 ENCOUNTER — Other Ambulatory Visit: Payer: Self-pay | Admitting: Family Medicine

## 2018-01-11 ENCOUNTER — Encounter: Payer: Self-pay | Admitting: Family Medicine

## 2018-01-11 VITALS — BP 140/80 | HR 56 | Temp 98.3°F | Ht 74.0 in | Wt 257.0 lb

## 2018-01-11 DIAGNOSIS — I10 Essential (primary) hypertension: Secondary | ICD-10-CM

## 2018-01-11 DIAGNOSIS — R42 Dizziness and giddiness: Secondary | ICD-10-CM | POA: Diagnosis not present

## 2018-01-11 DIAGNOSIS — M79674 Pain in right toe(s): Secondary | ICD-10-CM

## 2018-01-11 DIAGNOSIS — Z23 Encounter for immunization: Secondary | ICD-10-CM

## 2018-01-11 LAB — URIC ACID: Uric Acid, Serum: 6 mg/dL (ref 4.0–7.8)

## 2018-01-11 MED ORDER — OMEPRAZOLE 20 MG PO CPDR: 20.0000 mg | DELAYED_RELEASE_CAPSULE | Freq: Every day | ORAL | 1 refills | Status: DC

## 2018-01-11 MED ORDER — PREDNISONE 10 MG PO TABS: ORAL_TABLET | ORAL | 0 refills | Status: DC

## 2018-01-11 NOTE — Assessment & Plan Note (Signed)
Likely BPPV.. Gave desensitization exercises. Less likely related to elevated BP.Marland Kitchen See HTN comments.

## 2018-01-11 NOTE — Progress Notes (Signed)
   Subjective:    Patient ID: Nicholas Thompson, male    DOB: 09-Feb-1956, 62 y.o.   MRN: [id number]  HPI  62 year old patient of Dr. Lillie Fragmin presents with 2 new issues.  1. In last 1-2 months he has been sore at his DIP joint at his right great toe.  No known injury. He is on feet all day at work.  left THR in 08/2017 gait has been different during recovery.  Wears supportive shoes. He has history of gout.  Great toe has been sore at rest but mainly when up on feet, no swelling and redness.  History of surgery in right great toe 20 years ago and repeat surgery removing metal rod few years later. No pain since.  2. Intermittent dizziness.Marland Kitchen Describes as mild room spinning. Momentary.. No nausea, no headache. Ongoing sine 08/2017 with surgery. Vertigo is triggered by  Moving head. Has allergies and intermittant congesiton. No ear pain.  No CP, SOB, palpitatitons.  3. BP has been up at all recent MD visits... Pt feels it has been borderline in last few years despite 20 mg daily of ramipril.  he is hesitant about increasing BP meds.  He has gained weight since surgery.   Review of Systems  Constitutional: Negative for fatigue and fever.  HENT: Negative for ear pain.   Eyes: Negative for pain.  Respiratory: Negative for cough and shortness of breath.   Cardiovascular: Negative for chest pain, palpitations and leg swelling.  Gastrointestinal: Negative for abdominal pain.  Genitourinary: Negative for dysuria.  Musculoskeletal: Negative for arthralgias.  Neurological: Positive for dizziness. Negative for syncope, light-headedness and headaches.  Psychiatric/Behavioral: Negative for dysphoric mood.       Objective:   Physical Exam  Constitutional: He is oriented to person, place, and time. Vital signs are normal. He appears well-developed and well-nourished.  HENT:  Head: Normocephalic.  Right Ear: Hearing normal.  Left Ear: Hearing normal.  Nose: Nose normal.  Mouth/Throat: Oropharynx  is clear and moist and mucous membranes are normal.  Neck: Trachea normal. Carotid bruit is not present. No thyroid mass and no thyromegaly present.  Cardiovascular: Normal rate, regular rhythm and normal pulses. Exam reveals no gallop, no distant heart sounds and no friction rub.  No murmur heard. No peripheral edema  Pulmonary/Chest: Effort normal and breath sounds normal. No respiratory distress.  Musculoskeletal:       Right foot: There is normal range of motion and no deformity.  ttp at DIP joint if right 1st digit  no decreased ROM, no redness or swelling  able to weight bear.  Feet:  Right Foot:  Skin Integrity: Positive for callus. Negative for ulcer, blister, skin breakdown, erythema or warmth.  Neurological: He is oriented to person, place, and time. He has normal strength. No cranial nerve deficit or sensory deficit.   Neg modified dix hallpike  Skin: Skin is warm, dry and intact. No rash noted.  Psychiatric: He has a normal mood and affect. His speech is normal and behavior is normal. Thought content normal.          Assessment & Plan:

## 2018-01-11 NOTE — Progress Notes (Signed)
Rx  sent  to  [facility]

## 2018-01-11 NOTE — Assessment & Plan Note (Signed)
Per pt he has remote history of goiut.  Eval uric acid , but toe does not appear inflamed. ? OA , no clear tendonitis etc. NSAIDs contraindicated with one kidney ... Will await labs and likely treat with prednisone taper.  If not improving .[place] and consider podiatry referral given past surgery.

## 2018-01-11 NOTE — Assessment & Plan Note (Signed)
Poor control, pt will modify lifestyle, work on weight loss and follow up with PCP.  he refuses medication adjustment at this time.  Follow BPs at home, goal < 140/90.

## 2018-01-11 NOTE — Patient Instructions (Addendum)
Follow BP at home.. Goal < 140/90... If persistently elevated discuss med change with Dr. Lorelei Pont at next [place] or soon.  Work on weight loss, low salt diet, and exercise as tolerated.  Please stop at the lab to have labs drawn.  Start home vertigo exercises.

## 2018-01-12 ENCOUNTER — Telehealth: Payer: Self-pay | Admitting: Urology

## 2018-01-12 ENCOUNTER — Encounter: Payer: Self-pay | Admitting: Urology

## 2018-01-12 ENCOUNTER — Ambulatory Visit (INDEPENDENT_AMBULATORY_CARE_PROVIDER_SITE_OTHER): Payer: BLUE CROSS/BLUE SHIELD | Admitting: Urology

## 2018-01-12 DIAGNOSIS — C641 Malignant neoplasm of right kidney, except renal pelvis: Secondary | ICD-10-CM

## 2018-01-12 NOTE — Telephone Encounter (Signed)
-----   Message from Hollice Espy, MD sent at 01/12/2018 10:14 AM EDT ----- Can you make sure these studies get scheduled and that he is rescheduled?

## 2018-01-12 NOTE — Telephone Encounter (Signed)
He is scheduled for his CT scan on the 31st  Michelle

## 2018-01-12 NOTE — Progress Notes (Signed)
Patient presented today for follow-up of his renal cell carcinoma, but did not have his imaging studies.  As such, we will reschedule after he has had his CT scan completed.  Hollice Espy, MD

## 2018-01-12 NOTE — Telephone Encounter (Signed)
I called him and resched today's app  Sharyn Lull

## 2018-01-19 ENCOUNTER — Ambulatory Visit
Admission: RE | Admit: 2018-01-19 | Discharge: 2018-01-19 | Disposition: A | Discharge status: Home / Self Care | Payer: BLUE CROSS/BLUE SHIELD | Source: Ambulatory Visit | Attending: Urology | Admitting: Urology

## 2018-01-19 DIAGNOSIS — L989 Disorder of the skin and subcutaneous tissue, unspecified: Secondary | ICD-10-CM | POA: Diagnosis not present

## 2018-01-19 DIAGNOSIS — C641 Malignant neoplasm of right kidney, except renal pelvis: Secondary | ICD-10-CM | POA: Diagnosis not present

## 2018-01-19 DIAGNOSIS — I77810 Thoracic aortic ectasia: Secondary | ICD-10-CM | POA: Insufficient documentation

## 2018-01-19 DIAGNOSIS — I700 Atherosclerosis of aorta: Secondary | ICD-10-CM | POA: Diagnosis not present

## 2018-01-19 DIAGNOSIS — Z905 Acquired absence of kidney: Secondary | ICD-10-CM | POA: Diagnosis not present

## 2018-01-19 DIAGNOSIS — N200 Calculus of kidney: Secondary | ICD-10-CM | POA: Insufficient documentation

## 2018-01-19 DIAGNOSIS — Z85528 Personal history of other malignant neoplasm of kidney: Secondary | ICD-10-CM | POA: Diagnosis not present

## 2018-01-19 LAB — POCT I-STAT CREATININE: CREATININE: 0.9 mg/dL (ref 0.61–1.24)

## 2018-01-19 MED ORDER — IOPAMIDOL (ISOVUE-300) INJECTION 61%
100.0000 mL | Freq: Once | INTRAVENOUS | Status: AC | PRN
  Administered 2018-01-19: 100 mL via INTRAVENOUS

## 2018-01-26 ENCOUNTER — Encounter: Payer: Self-pay | Admitting: Internal Medicine

## 2018-01-26 ENCOUNTER — Ambulatory Visit (AMBULATORY_SURGERY_CENTER): Payer: BLUE CROSS/BLUE SHIELD | Admitting: Internal Medicine

## 2018-01-26 VITALS — BP 167/76 | HR 57 | Temp 99.1°F | Resp 11 | Ht 74.0 in | Wt 253.0 lb

## 2018-01-26 DIAGNOSIS — R1310 Dysphagia, unspecified: Secondary | ICD-10-CM | POA: Diagnosis not present

## 2018-01-26 DIAGNOSIS — K222 Esophageal obstruction: Secondary | ICD-10-CM

## 2018-01-26 DIAGNOSIS — D124 Benign neoplasm of descending colon: Secondary | ICD-10-CM | POA: Diagnosis not present

## 2018-01-26 DIAGNOSIS — K219 Gastro-esophageal reflux disease without esophagitis: Secondary | ICD-10-CM | POA: Diagnosis not present

## 2018-01-26 DIAGNOSIS — D122 Benign neoplasm of ascending colon: Secondary | ICD-10-CM

## 2018-01-26 DIAGNOSIS — Z1211 Encounter for screening for malignant neoplasm of colon: Secondary | ICD-10-CM | POA: Diagnosis not present

## 2018-01-26 DIAGNOSIS — K621 Rectal polyp: Secondary | ICD-10-CM | POA: Diagnosis not present

## 2018-01-26 DIAGNOSIS — D128 Benign neoplasm of rectum: Secondary | ICD-10-CM

## 2018-01-26 MED ORDER — SODIUM CHLORIDE 0.9 % IV SOLN: 500.0000 mL | Freq: Once | INTRAVENOUS | Status: DC

## 2018-01-26 NOTE — Progress Notes (Signed)
Report to PACU, RN, vss, BBS= Clear.  

## 2018-01-26 NOTE — Progress Notes (Signed)
Called to room to assist during endoscopic procedure.  Patient ID and intended procedure confirmed with present staff. Received instructions for my participation in the procedure from the performing physician.  

## 2018-01-26 NOTE — Op Note (Signed)
[name]: Nicholas Thompson Procedure Date: 01/26/2018 2:42 PM MRN: [id number] Endoscopist: Docia Chuck. Nicholas Thompson , MD Age: 62 Referring MD:  Date of Birth: 10-Apr-1955 Gender: Male Account #: [id number] Procedure:                Colonoscopy cold snare polypectomy x 4 Indications:              Screening for colorectal malignant neoplasm.                            Negative index exam 2006 Medicines:                Monitored Anesthesia Care Procedure:                Pre-Anesthesia Assessment:                           - Prior to the procedure, a History and Physical                            was performed, and patient medications and                            allergies were reviewed. The patient's tolerance of                            previous anesthesia was also reviewed. The risks                            and benefits of the procedure and the sedation                            options and risks were discussed with the patient.                            All questions were answered, and informed consent                            was obtained. Prior Anticoagulants: The patient has                            taken no previous anticoagulant or antiplatelet                            agents. ASA Grade Assessment: II - A patient with                            mild systemic disease. After reviewing the risks                            and benefits, the patient was deemed in                            satisfactory condition to undergo the procedure.  After obtaining informed consent, the colonoscope                            was passed under direct vision. Throughout the                            procedure, the patient's blood pressure, pulse, and                            oxygen saturations were monitored continuously. The                            Colonoscope was introduced through the anus and                            advanced to the the  cecum, identified by                            appendiceal orifice and ileocecal valve. The                            ileocecal valve, appendiceal orifice, and rectum                            were photographed. The quality of the bowel                            preparation was excellent. The colonoscopy was                            performed without difficulty. The patient tolerated                            the procedure well. The bowel preparation used was                            SUPREP. Scope In: 2:51:38 PM Scope Out: 3:13:41 PM Scope Withdrawal Time: 0 hours 19 minutes 22 seconds  Total Procedure Duration: 0 hours 22 minutes 3 seconds  Findings:                 Four polyps were found in the rectum, descending                            colon and ascending colon. The polyps were 2 to 5                            mm in size. These polyps were removed with a cold                            snare. Resection and retrieval were complete.                           The exam was otherwise without abnormality on  direct and retroflexion views. Complications:            No immediate complications. Estimated blood loss:                            None. Estimated Blood Loss:     Estimated blood loss: none. Impression:               - Four 2 to 5 mm polyps in the rectum, in the                            descending colon and in the ascending colon,                            removed with a cold snare. Resected and retrieved.                           - The examination was otherwise normal on direct                            and retroflexion views. Recommendation:           - Repeat colonoscopy in 3 - 5 years for                            surveillance.                           - Patient has a contact number available for                            emergencies. The signs and symptoms of potential                            delayed complications were discussed  with the                            patient. Return to normal activities tomorrow.                            Written discharge instructions were provided to the                            patient.                           - Resume previous diet.                           - Continue present medications.                           - Await pathology results. Docia Chuck. Nicholas Pastor, MD 01/26/2018 3:18:54 PM This report has been signed electronically.

## 2018-01-26 NOTE — Patient Instructions (Signed)
HANDOUTS GIVEN FOR POLYPS, STRICTURE AND POST DILATION DIET  YOU HAD AN ENDOSCOPIC PROCEDURE TODAY AT [facility]:   Refer to the procedure report that was given to you for any specific questions about what was found during the examination.  If the procedure report does not answer your questions, please call your gastroenterologist to clarify.  If you requested that your care partner not be given the details of your procedure findings, then the procedure report has been included in a sealed envelope for you to review at your convenience later.  YOU SHOULD EXPECT: Some feelings of bloating in the abdomen. Passage of more gas than usual.  Walking can help get rid of the air that was put into your GI tract during the procedure and reduce the bloating. If you had a lower endoscopy (such as a colonoscopy or flexible sigmoidoscopy) you may notice spotting of blood in your stool or on the toilet paper. If you underwent a bowel prep for your procedure, you may not have a normal bowel movement for a few days.  Please Note:  You might notice some irritation and congestion in your nose or some drainage.  This is from the oxygen used during your procedure.  There is no need for concern and it should clear up in a day or so.  SYMPTOMS TO REPORT IMMEDIATELY:   Following lower endoscopy (colonoscopy or flexible sigmoidoscopy):  Excessive amounts of blood in the stool  Significant tenderness or worsening of abdominal pains  Swelling of the abdomen that is new, acute  Fever of 100F or higher   Following upper endoscopy (EGD)  Vomiting of blood or coffee ground material  New chest pain or pain under the shoulder blades  Painful or persistently difficult swallowing  New shortness of breath  Fever of 100F or higher  Black, tarry-looking stools  For urgent or emergent issues, a gastroenterologist can be reached at any hour by calling [phone].   DIET:  We do recommend a small meal at  first, but then you may proceed to your regular diet.  Drink plenty of fluids but you should avoid alcoholic beverages for 24 hours.  ACTIVITY:  You should plan to take it easy for the rest of today and you should NOT DRIVE or use heavy machinery until tomorrow (because of the sedation medicines used during the test).    FOLLOW UP: Our staff will call the number listed on your records the next business day following your procedure to check on you and address any questions or concerns that you may have regarding the information given to you following your procedure. If we do not reach you, we will leave a message.  However, if you are feeling well and you are not experiencing any problems, there is no need to return our call.  We will assume that you have returned to your regular daily activities without incident.  If any biopsies were taken you will be contacted by phone or by letter within the next 1-3 weeks.  Please call us at [phone] if you have not heard about the biopsies in 3 weeks.    SIGNATURES/CONFIDENTIALITY: You and/or your care partner have signed paperwork which will be entered into your electronic medical record.  These signatures attest to the fact that that the information above on your After Visit Summary has been reviewed and is understood.  Full responsibility of the confidentiality of this discharge information lies with you and/or your care-partner.

## 2018-01-26 NOTE — Op Note (Signed)
[place] Patient Name: Nicholas Thompson Procedure Date: 01/26/2018 2:41 PM MRN: [id number] Endoscopist: Docia Chuck. Henrene Pastor , MD Age: 62 Referring MD:  Date of Birth: 07/10/1955 Gender: Male Account #: [id number] Procedure:                Upper GI endoscopy Indications:              Dysphagia, Esophageal reflux Medicines:                Monitored Anesthesia Care Procedure:                Pre-Anesthesia Assessment:                           - Prior to the procedure, a History and Physical                            was performed, and patient medications and                            allergies were reviewed. The patient's tolerance of                            previous anesthesia was also reviewed. The risks                            and benefits of the procedure and the sedation                            options and risks were discussed with the patient.                            All questions were answered, and informed consent                            was obtained. Prior Anticoagulants: The patient has                            taken no previous anticoagulant or antiplatelet                            agents. ASA Grade Assessment: II - A patient with                            mild systemic disease. After reviewing the risks                            and benefits, the patient was deemed in                            satisfactory condition to undergo the procedure.                           After obtaining informed consent, the endoscope was  passed under direct vision. Throughout the                            procedure, the patient's blood pressure, pulse, and                            oxygen saturations were monitored continuously. The                            Endoscope was introduced through the mouth, and                            advanced to the second part of duodenum. The upper                            GI endoscopy was accomplished  without difficulty.                            The patient tolerated the procedure well. Scope In: Scope Out: Findings:                 One benign-appearing, intrinsic moderate stenosis                            was found 40 cm from the incisors. This stenosis                            measured 1.5 cm (inner diameter). The scope was                            withdrawn. Dilation was performed with a Maloney                            dilator with no resistance at 51 Fr.                           The exam of the esophagus was otherwise normal.                           The stomach was normal.                           The examined duodenum was normal.                           The cardia and gastric fundus were normal on                            retroflexion. Complications:            No immediate complications. Estimated Blood Loss:     Estimated blood loss: none. Impression:               - Benign-appearing esophageal stenosis. Dilated.                           -  Normal stomach.                           - Normal examined duodenum.                           - No specimens collected. Recommendation:           - Patient has a contact number available for                            emergencies. The signs and symptoms of potential                            delayed complications were discussed with the                            patient. Return to normal activities tomorrow.                            Written discharge instructions were provided to the                            patient.                           - Post dilation diet.                           - Continue present medications. Docia Chuck. Henrene Pastor, MD 01/26/2018 3:31:29 PM This report has been signed electronically.

## 2018-01-26 NOTE — Progress Notes (Signed)
Patient states there is no change to his medical history since his preadmission visit.

## 2018-01-27 ENCOUNTER — Telehealth: Payer: Self-pay

## 2018-01-27 NOTE — Telephone Encounter (Signed)
  Follow up Call-  Call back number 01/26/2018  Post procedure Call Back phone  # [phone]  Permission to leave phone message Yes  Some recent data might be hidden     Left mesage

## 2018-01-27 NOTE — Telephone Encounter (Signed)
Attempted to reach patient for post-procedure f/u call. No answer. Left message that we will make another attempt to reach him again later today and for him to please not hesitate to call us if he has any questions/concerns regarding his care. 

## 2018-01-27 NOTE — Telephone Encounter (Signed)
Pt said he is returning your call and doing good

## 2018-01-30 NOTE — Progress Notes (Addendum)
January 30, 2018 9:24 AM   Nicholas Thompson 1955-08-15 [id number]  Referring provider: Owens Loffler, MD [address] [place], [place] [address]  Chief Complaint  Patient presents with  . Results    HPI:  Nicholas Thompson is a 62 yo male who returns today for follow for history of renal cell carcinoma s/p right radical nephrectomy pT3a clear cell RCC with negative margins. His tumor was 4.6cm in size but there was extension into perinephric tissue in April 2017.   The pt reports that he is currently doing well overall.   The patient was last seen in 02/2017 by Dr. Baruch Gouty without evidence of disease.   Returns today with CT chest / abd/ pelvis without any findings.    The pt reports that his left kidney stone was previously attempted to be removed in his 2017 surgery but were not able to be retrieved. The pt denies any pain or blood related to the stone, and he notes that he stays well hydrated regularly.   Asymptomatic.  No previous stone episodes.    Related to the patient's hernia, the pt denies any abdominal pain related to his hernia appreciated on recent CT imaging.    Pt denies any unexpected weight loss or any other constitutional concerns.    Creatinine from 01/19/18 was at 0.90  PMH: Past Medical History:  Diagnosis Date  . Arthritis   . Basal cell carcinoma    bASAL CELL  . Chronic kidney disease   . GERD (gastroesophageal reflux disease)   . History of colonic polyps   . History of kidney stones   . Hyperlipidemia   . Nephrolithiasis   . Other and unspecified hyperlipidemia   . PONV (postoperative nausea and vomiting)   . Renal cell carcinoma of right kidney ([place]) 07/30/2015   Nephrectomy  . S/p nephrectomy 07/30/2015  . Unspecified essential hypertension     Surgical History: Past Surgical History:  Procedure Laterality Date  . arthroscopy of knee Right 1989  . CYSTOSCOPY WITH STENT PLACEMENT Left 06/11/2015   Procedure: CYSTOSCOPY WITH STENT  PLACEMENT/ bilateral retrograde pyelogram;  Surgeon: [name], MD;  Location: [facility] ORS;  Service: Urology;  Laterality: Left;  . [place] 2001&2006   3 times  . LAPAROSCOPIC NEPHRECTOMY, HAND ASSISTED Right 07/09/2015   Procedure: HAND ASSISTED LAPAROSCOPIC NEPHRECTOMY;  Surgeon: [name], MD;  Location: [facility] OR[facility];  Service: Urology;  Laterality: Right;  . MOHS SURGERY Left 04/2015   [place], [facility]  . SHOULDER ARTHROSCOPY W/ SUBACROMIAL DECOMPRESSION AND DISTAL CLAVICLE EXCISION Left 08-27-2007  . TOTAL HIP ARTHROPLASTY Left 08/30/2017   Procedure: TOTAL HIP ARTHROPLASTY ANTERIOR APPROACH;  Surgeon: Hessie Knows, MD;  Location: [facility] OR[facility];  Service: Orthopedics;  Laterality: Left;  . URETEROSCOPY WITH HOLMIUM LASER LITHOTRIPSY Bilateral 06/11/2015   Procedure: URETEROSCOPY WITH HOLMIUM LASER LITHOTRIPSY;  Surgeon: [name], MD;  Location: [facility];  Service: Urology;  Laterality: Bilateral;  . VASECTOMY      Home Medications:  Allergies as of 01/31/2018      Reactions   Vicodin [hydrocodone-acetaminophen] Nausea And Vomiting      Medication List        Accurate as of 01/31/18  9:24 AM. Always use your most recent med list.          acetaminophen 500 MG tablet Commonly known as:  TYLENOL Take 1,000 mg by mouth every 6 (six) hours as needed.   enalapril 20  MG tablet Commonly known as:  VASOTEC TAKE TWO TABLETS BY MOUTH ONCE DAILY   ketoconazole 2 % cream Commonly known as:  NIZORAL Apply 1 application topically daily as needed for irritation.   Melatonin 3 MG Tabs Take 12 mg by mouth at bedtime as needed (sleep).   omeprazole 20 MG capsule Commonly known as:  PRILOSEC Take 1 capsule (20 mg total) by mouth daily.       Allergies:  Allergies  Allergen Reactions  . Vicodin [Hydrocodone-Acetaminophen] Nausea And Vomiting    Family History: Family History  Problem Relation Age of Onset  . Cancer Mother        bladder  . Stroke  Mother 31  . Diabetes Father   . Hyperlipidemia Father   . Coronary artery disease Father   . Cancer Paternal Uncle        brain  . Stroke Maternal Grandfather   . Cancer Paternal Grandfather        lung  . Prostate cancer Neg Hx   . Colon cancer Neg Hx     Social History:  reports that he has quit smoking. His smoking use included cigars. He has never used smokeless tobacco. He reports that he drinks alcohol. He reports that he does not use drugs.  ROS: UROLOGY Frequent Urination?: Yes Hard to postpone urination?: No Burning/pain with urination?: No Get up at night to urinate?: Yes Leakage of urine?: No Urine stream starts and stops?: No Trouble starting stream?: No Do you have to strain to urinate?: No Blood in urine?: No Urinary tract infection?: No Sexually transmitted disease?: No Injury to kidneys or bladder?: No Painful intercourse?: No Weak stream?: No Erection problems?: No Penile pain?: No  Gastrointestinal Nausea?: No Vomiting?: No Indigestion/heartburn?: No Diarrhea?: No Constipation?: No  Constitutional Fever: No Night sweats?: No Weight loss?: No Fatigue?: No  Skin Skin rash/lesions?: No Itching?: No  Eyes Blurred vision?: No Double vision?: No  Ears/Nose/Throat Sore throat?: No Sinus problems?: No  Hematologic/Lymphatic Swollen glands?: No Easy bruising?: No  Cardiovascular Leg swelling?: No Chest pain?: No  Respiratory Cough?: No Shortness of breath?: No  Endocrine Excessive thirst?: No  Musculoskeletal Back pain?: No Joint pain?: No  Neurological Headaches?: No Dizziness?: No  Psychologic Depression?: No Anxiety?: No  Physical Exam: BP (!) 165/75   Pulse (!) 58   Wt 256 lb (116.1 kg)   BMI 32.87 kg/m   Constitutional:  Alert and oriented, No acute distress. HEENT: [place] AT, moist mucus membranes.  Trachea midline, no masses. Cardiovascular: No clubbing, cyanosis, or edema. Respiratory: Normal respiratory  effort, no increased work of breathing. GI: Abdomen is soft, nontender, nondistended, no abdominal masses, hernia not appreciated on exam  Skin: No rashes, bruises or suspicious lesions. Neurologic: Grossly intact, no focal deficits, moving all 4 extremities. Psychiatric: Normal mood and affect.  Laboratory Data:  Lab Results  Component Value Date   CREATININE 0.90 01/19/2018   Pertinent Imaging:  CLINICAL DATA:  Right renal cell carcinoma status post right nephrectomy in 2017. Restaging.  EXAM: CT CHEST, ABDOMEN, AND PELVIS WITH CONTRAST  TECHNIQUE: Multidetector CT imaging of the chest, abdomen and pelvis was performed following the standard protocol during bolus administration of intravenous contrast.  CONTRAST:  146mL ISOVUE-300 IOPAMIDOL (ISOVUE-300) INJECTION 61%  COMPARISON:  02/24/2017 CT chest, abdomen and pelvis.  FINDINGS: CT CHEST FINDINGS  Cardiovascular: Normal heart size. No significant pericardial effusion/thickening. Left anterior descending coronary atherosclerosis. Atherosclerotic thoracic aorta with stable ectatic 4.2 cm ascending thoracic  aorta. Normal caliber pulmonary arteries. No central pulmonary emboli.  Mediastinum/Nodes: No discrete thyroid nodules. Unremarkable esophagus. No pathologically enlarged axillary, mediastinal or hilar lymph nodes.  Lungs/Pleura: No pneumothorax. No pleural effusion. No acute consolidative airspace disease or lung masses. Perifissural 4 mm left pulmonary nodule (series 3/image 86), stable since 08/06/2009 CT, considered benign. No new significant pulmonary nodules.  Musculoskeletal: No aggressive appearing focal osseous lesions. Moderate thoracic spondylosis. Superficial subcutaneous 1.9 cm soft tissue density nodule in the midline upper back (series 2/image 5), stable. Irregular cutaneous lesion in the midline ventral chest measuring up to 3.9 cm with (series 2/image 43), not  appreciably changed.  CT ABDOMEN PELVIS FINDINGS  Hepatobiliary: Normal liver with no liver mass. Normal gallbladder with no radiopaque cholelithiasis. No biliary ductal dilatation.  Pancreas: Normal, with no mass or duct dilation.  Spleen: Normal size. No mass.  Adrenals/Urinary Tract: Normal adrenals. Status post right nephrectomy. No mass or fluid collection in the right nephrectomy bed. A few nonobstructing stones scattered in the left kidney, largest 7 mm in the lower left kidney. No left hydronephrosis. Subcentimeter hypodense renal cortical lesion in lateral lower left kidney is too small to characterize and stable, considered benign. No new left renal masses. Bladder obscured by streak artifact from the left hip hardware. Bladder appears grossly normal.  Stomach/Bowel: Normal non-distended stomach. Normal caliber small bowel with no small bowel wall thickening. Normal appendix. Normal large bowel with no diverticulosis, large bowel wall thickening or pericolonic fat stranding.  Vascular/Lymphatic: Atherosclerotic nonaneurysmal abdominal aorta. Patent portal, splenic, hepatic and left renal veins. No pathologically enlarged lymph nodes in the abdomen or pelvis.  Reproductive: Top-normal size prostate is obscured by streak artifact.  Other: No pneumoperitoneum, ascites or focal fluid collection. Stable 2.7 cm soft tissue nodule in the superficial subcutaneous right lower back near the midline (series 2/image 79). Stable moderate right lateral abdominal wall hernia containing fat.  Musculoskeletal: No aggressive appearing focal osseous lesions. Left total hip arthroplasty. Moderate lumbar spondylosis.  IMPRESSION: 1. No evidence of local tumor recurrence in the right nephrectomy bed. 2. No findings suspicious for metastatic disease in the chest, abdomen or pelvis. 3. Cutaneous lesion in the midline ventral chest and superficial subcutaneous soft tissue  nodules in the midline upper back and right low back are all stable and nonspecific. 4. Nonobstructing left nephrolithiasis. 5. Stable ectatic 4.2 cm ascending thoracic aorta. Recommend annual imaging followup by CTA or MRA. This recommendation follows 2010 ACCF/AHA/AATS/ACR/ASA/SCA/SCAI/SIR/STS/SVM Guidelines for the Diagnosis and Management of Patients with Thoracic Aortic Disease. Circulation. 2010; 121: J696-V893. 6.  Aortic Atherosclerosis (ICD10-I70.0).   Electronically Signed   By: Ilona Sorrel M.D.   On: 01/19/2018 11:17   CT scan persiounally reviewed.    Assessment & Plan:    1. Left Lower pole kidney stone, nonobstructing Asymptomatic,  will continue to follow  warning symptoms reviewed   2. History of Renal cell carcinoma S/p right nephrectomy June 2019 No evidence of recurrence on imaging Per guidelines recommend continued annual cross sectional imaging  Follow up in 1 year with CT A/P CXR Solitary kidney precautions reviewed including avoiding Ibuprofen and Motrin.   3. Ascending thoracic aortic etasia Will continue to follow with serial imaging  4. Incisional hernia, without obstruction or gangrene Asymptomatic, fat containing Warning symptoms reviewed   Return in about 1 year (around 02/01/2019) for CT scan and CXR.  [facility] [address], [place], [place] [address] [phone]  I, Baldwin Jamaica, am acting as a scribe for Dr. Hollice Espy,   I have reviewed the above documentation for accuracy and completeness, and I agree with the above.   Hollice Espy, MD

## 2018-01-31 ENCOUNTER — Ambulatory Visit (INDEPENDENT_AMBULATORY_CARE_PROVIDER_SITE_OTHER): Payer: BLUE CROSS/BLUE SHIELD | Admitting: Urology

## 2018-01-31 ENCOUNTER — Other Ambulatory Visit: Payer: Self-pay

## 2018-01-31 ENCOUNTER — Encounter: Payer: Self-pay | Admitting: Urology

## 2018-01-31 VITALS — BP 165/75 | HR 58 | Wt 256.0 lb

## 2018-01-31 DIAGNOSIS — K432 Incisional hernia without obstruction or gangrene: Secondary | ICD-10-CM

## 2018-01-31 DIAGNOSIS — C641 Malignant neoplasm of right kidney, except renal pelvis: Secondary | ICD-10-CM

## 2018-01-31 DIAGNOSIS — N200 Calculus of kidney: Secondary | ICD-10-CM

## 2018-01-31 DIAGNOSIS — I77810 Thoracic aortic ectasia: Secondary | ICD-10-CM

## 2018-02-02 ENCOUNTER — Encounter: Payer: Self-pay | Admitting: Internal Medicine

## 2018-03-06 ENCOUNTER — Other Ambulatory Visit: Payer: Self-pay | Admitting: Family Medicine

## 2018-03-06 DIAGNOSIS — Z114 Encounter for screening for human immunodeficiency virus [HIV]: Secondary | ICD-10-CM

## 2018-03-06 DIAGNOSIS — Z131 Encounter for screening for diabetes mellitus: Secondary | ICD-10-CM

## 2018-03-06 DIAGNOSIS — Z0000 Encounter for general adult medical examination without abnormal findings: Secondary | ICD-10-CM

## 2018-03-06 DIAGNOSIS — Z1159 Encounter for screening for other viral diseases: Secondary | ICD-10-CM

## 2018-03-07 ENCOUNTER — Encounter: Payer: Self-pay | Admitting: Family Medicine

## 2018-03-07 ENCOUNTER — Ambulatory Visit: Payer: BLUE CROSS/BLUE SHIELD | Admitting: Family Medicine

## 2018-03-07 DIAGNOSIS — M25562 Pain in left knee: Secondary | ICD-10-CM

## 2018-03-07 MED ORDER — DICLOFENAC SODIUM 1 % TD GEL: 4.0000 g | Freq: Four times a day (QID) | TRANSDERMAL | 0 refills | Status: DC

## 2018-03-07 NOTE — Patient Instructions (Signed)
Start home PT as discussed on hand out.  Start diclofenac gel to put on medial knee.  Wear brace when up on feet, ice as needed.

## 2018-03-07 NOTE — Progress Notes (Signed)
   Subjective:    Patient ID: Nicholas Thompson, male    DOB: 04/14/1955, 62 y.o.   MRN: [id number]  HPI  62 year old male pt of Dr. Lillie Fragmin presents with new onset pain in left knee.  He reports ongoing pain x several month but worse in last few weeks.  No known injury or fall. He is sales rep s walks a lot, lots of bending and kneeling.   Pain in medical knee.  Pain comes and goes.. sharp pain, knee gives way  no clicking or popping or catching.  No swelling or redness.  In past 30 years ago.. he thinks he had arthroscopic surgery not sure why, but possibly a meniscal tear.  He has tried leftover  tramadol for pain .. he;lped some. He has bene wearing brace.. has help.  Has tried salonpas.   Blood pressure (!) 150/80, pulse 79, temperature 98.2 F (36.8 C), temperature source Oral, height 6\' 2"  (1.88 m), weight 253 lb 8 oz (115 kg). Social History /Family History/Past Medical History reviewed in detail and updated in EMR if needed.  Review of Systems  Constitutional: Negative for fatigue and fever.  HENT: Negative for ear pain.   Eyes: Negative for pain.  Respiratory: Negative for cough and shortness of breath.   Cardiovascular: Negative for chest pain, palpitations and leg swelling.  Gastrointestinal: Negative for abdominal pain.  Genitourinary: Negative for dysuria.  Musculoskeletal: Negative for arthralgias.  Neurological: Negative for syncope, light-headedness and headaches.  Psychiatric/Behavioral: Negative for dysphoric mood.       Objective:   Physical Exam Constitutional:      Appearance: He is well-developed.  HENT:     Head: Normocephalic.     Right Ear: Hearing normal.     Left Ear: Hearing normal.     Nose: Nose normal.  Neck:     Thyroid: No thyroid mass or thyromegaly.     Vascular: No carotid bruit.     Trachea: Trachea normal.  Cardiovascular:     Rate and Rhythm: Normal rate and regular rhythm.     Pulses: Normal pulses.     Heart sounds: Heart  sounds not distant. No murmur. No friction rub. No gallop.      Comments: No peripheral edema Pulmonary:     Effort: Pulmonary effort is normal. No respiratory distress.     Breath sounds: Normal breath sounds.  Musculoskeletal:     Left knee: He exhibits normal range of motion, no swelling, normal patellar mobility, no bony tenderness, normal meniscus and no MCL laxity. Tenderness found. MCL tenderness noted. No medial joint line, no lateral joint line, no LCL and no patellar tendon tenderness noted.  Skin:    General: Skin is warm and dry.     Findings: No rash.  Psychiatric:        Speech: Speech normal.        Behavior: Behavior normal.        Thought Content: Thought content normal.           Assessment & Plan:

## 2018-03-07 NOTE — Assessment & Plan Note (Addendum)
Tender over medial collateral ligament but not focally over joint line... ? MCL strain vs. OA.  Treat with short term topical diclofenac  (given has 1 kidney) , start home PT, ice and brace.  Follow up with PCP as scheduled on 12/30.

## 2018-03-09 ENCOUNTER — Telehealth: Payer: Self-pay | Admitting: *Deleted

## 2018-03-09 NOTE — Telephone Encounter (Signed)
Received fax from [facility] requesting PA for Diclofenac Gel 1%.  PA completed on CoverMyMeds and approved.  [facility] notified of approval via fax.

## 2018-03-14 ENCOUNTER — Other Ambulatory Visit (INDEPENDENT_AMBULATORY_CARE_PROVIDER_SITE_OTHER): Payer: BLUE CROSS/BLUE SHIELD

## 2018-03-14 DIAGNOSIS — Z131 Encounter for screening for diabetes mellitus: Secondary | ICD-10-CM

## 2018-03-14 DIAGNOSIS — Z1159 Encounter for screening for other viral diseases: Secondary | ICD-10-CM | POA: Diagnosis not present

## 2018-03-14 DIAGNOSIS — Z114 Encounter for screening for human immunodeficiency virus [HIV]: Secondary | ICD-10-CM | POA: Diagnosis not present

## 2018-03-14 DIAGNOSIS — Z0000 Encounter for general adult medical examination without abnormal findings: Secondary | ICD-10-CM

## 2018-03-14 LAB — BASIC METABOLIC PANEL
BUN: 15 mg/dL (ref 6–23)
CO2: 28 mEq/L (ref 19–32)
Calcium: 9.3 mg/dL (ref 8.4–10.5)
Chloride: 103 mEq/L (ref 96–112)
Creatinine, Ser: 0.95 mg/dL (ref 0.40–1.50)
GFR: 84.97 mL/min (ref >60.00)
GLUCOSE: 108 mg/dL — AB (ref 70–99)
Potassium: 4.4 mEq/L (ref 3.5–5.1)
Sodium: 139 mEq/L (ref 135–145)

## 2018-03-14 LAB — HEPATIC FUNCTION PANEL
ALBUMIN: 4.2 g/dL (ref 3.5–5.2)
ALT: 16 U/L (ref 0–53)
AST: 14 U/L (ref 0–37)
Alkaline Phosphatase: 80 U/L (ref 39–117)
Bilirubin, Direct: 0.1 mg/dL (ref 0.0–0.3)
Total Bilirubin: 0.5 mg/dL (ref 0.2–1.2)
Total Protein: 6.8 g/dL (ref 6.0–8.3)

## 2018-03-14 LAB — LIPID PANEL
CHOLESTEROL: 191 mg/dL (ref 0–200)
HDL: 47.2 mg/dL (ref >39.00)
LDL CALC: 124 mg/dL — AB (ref 0–99)
NonHDL: 143.68
Total CHOL/HDL Ratio: 4
Triglycerides: 100 mg/dL (ref 0.0–149.0)
VLDL: 20 mg/dL (ref 0.0–40.0)

## 2018-03-14 LAB — CBC WITH DIFFERENTIAL/PLATELET
Basophils Absolute: 0.1 10*3/uL (ref 0.0–0.1)
Basophils Relative: 0.7 % (ref 0.0–3.0)
EOS ABS: 0.2 10*3/uL (ref 0.0–0.7)
Eosinophils Relative: 2.2 % (ref 0.0–5.0)
HEMATOCRIT: 46 % (ref 39.0–52.0)
Hemoglobin: 15.8 g/dL (ref 13.0–17.0)
Lymphocytes Relative: 24.7 % (ref 12.0–46.0)
Lymphs Abs: 1.8 10*3/uL (ref 0.7–4.0)
MCHC: 34.3 g/dL (ref 30.0–36.0)
MCV: 91 fl (ref 78.0–100.0)
Monocytes Absolute: 0.5 10*3/uL (ref 0.1–1.0)
Monocytes Relative: 7.2 % (ref 3.0–12.0)
Neutro Abs: 4.8 10*3/uL (ref 1.4–7.7)
Neutrophils Relative %: 65.2 % (ref 43.0–77.0)
Platelets: 280 10*3/uL (ref 150.0–400.0)
RBC: 5.06 Mil/uL (ref 4.22–5.81)
RDW: 12.4 % (ref 11.5–15.5)
WBC: 7.4 10*3/uL (ref 4.0–10.5)

## 2018-03-14 LAB — PSA: PSA: 2.43 ng/mL (ref 0.10–4.00)

## 2018-03-14 LAB — HEMOGLOBIN A1C: Hgb A1c MFr Bld: 5.4 % (ref 4.6–6.5)

## 2018-03-16 LAB — HIV ANTIBODY (ROUTINE TESTING W REFLEX): HIV: NONREACTIVE

## 2018-03-16 LAB — HEPATITIS C ANTIBODY
Hepatitis C Ab: NONREACTIVE
SIGNAL TO CUT-OFF: 0.01 (ref <1.00)

## 2018-03-18 NOTE — Progress Notes (Signed)
Dr. Frederico Hamman T. Antoni Stefan, MD, [facility] Primary Care and Sports Medicine [place], [address] Phone: [phone] Fax: [phone]  03/20/2018  Patient: Nicholas Thompson, MRN: [id number], DOB: [birth year], 62 y.o.  Primary Physician:  Owens Loffler, MD   Chief Complaint  Patient presents with  . Annual Exam   Subjective:   Nicholas Thompson is a 62 y.o. pleasant patient who presents with the following:  Preventative Health Maintenance Visit:  Health Maintenance Summary Reviewed and updated, unless pt declines services.  Tobacco History Reviewed. Alcohol: No concerns, no excessive use Exercise Habits: Some activity, rec at least 30 mins 5 times a week STD concerns: no risk or activity to increase risk Drug Use: None Encouraged self-testicular check  Was having some pain in his knee a few weeks ago.   Has a lump on his back. Cyst.   Also has some soreness on his left heel.   L ear skin cancer  Health Maintenance  Topic Date Due  . COLONOSCOPY  01/26/2021  . TETANUS/TDAP  05/25/2021  . INFLUENZA VACCINE  Completed  . Hepatitis C Screening  Completed  . HIV Screening  Completed   Immunization History  Administered Date(s) Administered  . Influenza,inj,Quad PF,6+ Mos 01/11/2013, 01/30/2015, 02/27/2016, 05/18/2017, 01/11/2018  . Td 09/12/1998  . Tdap 05/26/2011   Patient Active Problem List   Diagnosis Date Noted  . Thoracic aortic aneurysm without rupture ([place]) 03/20/2018  . Left knee pain 03/07/2018  . Status post total hip replacement, left 08/30/2017  . Renal cell carcinoma of right kidney ([place]) 07/30/2015  . S/p RIGHT nephrectomy 07/30/2015  . COLONIC POLYPS, HX OF 08/07/2008  . HYPERLIPIDEMIA 05/18/2007  . HTN (hypertension) 05/18/2007  . HYPERGLYCEMIA 05/18/2007  . CARCINOMA, BASAL CELL, HX OF 05/18/2007   Past Medical History:  Diagnosis Date  . Arthritis   . Basal cell carcinoma    bASAL CELL  . Chronic kidney disease   .  GERD (gastroesophageal reflux disease)   . History of colonic polyps   . History of kidney stones   . Hyperlipidemia   . Nephrolithiasis   . Other and unspecified hyperlipidemia   . PONV (postoperative nausea and vomiting)   . Renal cell carcinoma of right kidney ([place]) 07/30/2015   Nephrectomy  . S/p nephrectomy 07/30/2015  . Unspecified essential hypertension    Past Surgical History:  Procedure Laterality Date  . arthroscopy of knee Right 1989  . CYSTOSCOPY WITH STENT PLACEMENT Left 06/11/2015   Procedure: CYSTOSCOPY WITH STENT PLACEMENT/ bilateral retrograde pyelogram;  Surgeon: [name], MD;  Location: [facility] ORS;  Service: Urology;  Laterality: Left;  . [employer] 2001&2006   3 times  . LAPAROSCOPIC NEPHRECTOMY, HAND ASSISTED Right 07/09/2015   Procedure: HAND ASSISTED LAPAROSCOPIC NEPHRECTOMY;  Surgeon: [name], MD;  Location: [facility];  Service: Urology;  Laterality: Right;  . MOHS SURGERY Left 04/2015   Ear, [facility]  . SHOULDER ARTHROSCOPY W/ SUBACROMIAL DECOMPRESSION AND DISTAL CLAVICLE EXCISION Left 08-27-2007  . TOTAL HIP ARTHROPLASTY Left 08/30/2017   Procedure: TOTAL HIP ARTHROPLASTY ANTERIOR APPROACH;  Surgeon: Hessie Knows, MD;  Location: [facility] ORS;  Service: Orthopedics;  Laterality: Left;  . URETEROSCOPY WITH HOLMIUM LASER LITHOTRIPSY Bilateral 06/11/2015   Procedure: URETEROSCOPY WITH HOLMIUM LASER LITHOTRIPSY;  Surgeon: [name], MD;  Location: [facility];  Service: Urology;  Laterality: Bilateral;  . VASECTOMY     Social History   Socioeconomic History  .  Marital status: Married    Spouse name: Not on file  . Number of children: 2  . Years of education: Not on file  . Highest education level: Not on file  Occupational History  . Occupation: Geographical information systems officer  . Financial resource strain: Not on file  . Food insecurity:    Worry: Not on file    Inability: Not on file  . Transportation needs:    Medical: Not on file     Non-medical: Not on file  Tobacco Use  . Smoking status: Former Smoker    Types: Cigars  . Smokeless tobacco: Never Used  Substance and Sexual Activity  . Alcohol use: Yes    Alcohol/week: 0.0 standard drinks    Comment: rarely  . Drug use: No  . Sexual activity: Not on file  Lifestyle  . Physical activity:    Days per week: Not on file    Minutes per session: Not on file  . Stress: Not on file  Relationships  . Social connections:    Talks on phone: Not on file    Gets together: Not on file    Attends religious service: Not on file    Active member of club or organization: Not on file    Attends meetings of clubs or organizations: Not on file    Relationship status: Not on file  . Intimate partner violence:    Fear of current or ex partner: Not on file    Emotionally abused: Not on file    Physically abused: Not on file    Forced sexual activity: Not on file  Other Topics Concern  . Not on file  Social History Narrative  . Not on file   Family History  Problem Relation Age of Onset  . Cancer Mother        bladder  . Stroke Mother 36  . Diabetes Father   . Hyperlipidemia Father   . Coronary artery disease Father   . Cancer Paternal Uncle        brain  . Stroke Maternal Grandfather   . Cancer Paternal Grandfather        lung  . Prostate cancer Neg Hx   . Colon cancer Neg Hx    Allergies  Allergen Reactions  . Vicodin [Hydrocodone-Acetaminophen] Nausea And Vomiting    Medication list has been reviewed and updated.   General: Denies fever, chills, sweats. No significant weight loss. Eyes: Denies blurring,significant itching ENT: Denies earache, sore throat, and hoarseness. Cardiovascular: Denies chest pains, palpitations, dyspnea on exertion Respiratory: Denies cough, dyspnea at rest,wheeezing Breast: no concerns about lumps GI: Denies nausea, vomiting, diarrhea, constipation, change in bowel habits, abdominal pain, melena, hematochezia GU: Denies penile  discharge, ED, urinary flow / outflow problems. No STD concerns. Musculoskeletal: KNEE PAIN. L. ALSO WITH PF Derm: Denies rash, itching. CYST ON BACK. MULTIPLE SKIN LESIONS.  Neuro: Denies  paresthesias, frequent falls, frequent headaches Psych: Denies depression, anxiety Endocrine: Denies cold intolerance, heat intolerance, polydipsia Heme: Denies enlarged lymph nodes Allergy: No hayfever  Objective:   BP 140/70   Pulse 66   Temp 97.7 F (36.5 C) (Oral)   Ht 5' 11.5" (1.816 m)   Wt 255 lb 4 oz (115.8 kg)   BMI 35.10 kg/m  Ideal Body Weight: Weight in (lb) to have BMI = 25: 181.4  No exam data present  GEN: well developed, well nourished, no acute distress Eyes: conjunctiva and lids normal, PERRLA, EOMI ENT: TM clear, nares  clear, oral exam WNL Neck: supple, no lymphadenopathy, no thyromegaly, no JVD Pulm: clear to auscultation and percussion, respiratory effort normal CV: regular rate and rhythm, S1-S2, no murmur, rub or gallop, no bruits, peripheral pulses normal and symmetric, no cyanosis, clubbing, edema or varicosities GI: soft, non-tender; no hepatosplenomegaly, masses; active bowel sounds all quadrants GU: no hernia, testicular mass, penile discharge Lymph: no cervical, axillary or inguinal adenopathy MSK: gait normal, muscle tone and strength WNL, no joint swelling, effusions, discoloration, crepitus   Foot exam, L Echymosis: no Edema: no ROM: full LE B Gait: heel toe, non-antalgic MT pain: no Callus pattern: none Lateral Mall: NT Medial Mall: NT Talus: NT Navicular: NT Calcaneous: NT Metatarsals: NT 5th MT: NT Phalanges: NT Achilles: NT Plantar Fascia: tender, medial along PF. Pain with forced dorsi Fat Pad: NT Peroneals: NT Post Tib: NT Great Toe: Nml motion Ant Drawer: neg Other foot breakdown: none Long arch: preserved Transverse arch: preserved Hindfoot breakdown: none Sensation: intact   SKIN: clear, good turgor, color WNL. > 1 cm pearly,  ulcerated lesion on L ear. Also with multiple scars and large cyst on the middle of his back.  Neuro: normal mental status, normal strength, sensation, and motion Psych: alert; oriented to person, place and time, normally interactive and not anxious or depressed in appearance.  All labs reviewed with patient.  Lipids: Lab Results  Component Value Date   CHOL 191 03/14/2018   Lab Results  Component Value Date   HDL 47.20 03/14/2018   Lab Results  Component Value Date   LDLCALC 124 (H) 03/14/2018   Lab Results  Component Value Date   TRIG 100.0 03/14/2018   Lab Results  Component Value Date   CHOLHDL 4 03/14/2018   CBC: CBC Latest Ref Rng & Units 03/14/2018 09/26/2017 09/01/2017  WBC 4.0 - 10.5 K/uL 7.4 8.4 12.6(H)  Hemoglobin 13.0 - 17.0 g/dL 15.8 14.2 13.9  Hematocrit 39.0 - 52.0 % 46.0 41.4 39.5(L)  Platelets 150.0 - 400.0 K/uL 280.0 303.0 697    Basic Metabolic Panel:    Component Value Date/Time   NA 139 03/14/2018 0739   K 4.4 03/14/2018 0739   CL 103 03/14/2018 0739   CO2 28 03/14/2018 0739   BUN 15 03/14/2018 0739   CREATININE 0.95 03/14/2018 0739   GLUCOSE 108 (H) 03/14/2018 0739   CALCIUM 9.3 03/14/2018 0739   Hepatic Function Latest Ref Rng & Units 03/14/2018 05/11/2015 05/08/2015  Total Protein 6.0 - 8.3 g/dL 6.8 7.3 7.5  Albumin 3.5 - 5.2 g/dL 4.2 3.9 4.2  AST 0 - 37 U/L '14 25 15  ' ALT 0 - 53 U/L '16 25 19  ' Alk Phosphatase 39 - 117 U/L 80 74 74  Total Bilirubin 0.2 - 1.2 mg/dL 0.5 0.9 0.7  Bilirubin, Direct 0.0 - 0.3 mg/dL 0.1 - 0.0    Lab Results  Component Value Date   TSH 0.39 05/08/2015   Lab Results  Component Value Date   PSA 2.43 03/14/2018   PSA 1.21 01/28/2015   PSA 1.44 06/25/2013    Assessment and Plan:   Routine general medical examination at a health care facility  Thoracic aortic aneurysm without rupture [facility]) - Annual CTA or MRA per radiology 2019  Cancer of skin of left ear - Plan: Ambulatory referral to  Dermatology  Probable BCC vs SCC on L ear, large, consult his dermatologist.   Annual CT of chest with contrast saw 4.2 cm ectatic aorta. Confirm with radiology regarding follow-up imaging.  Plantar Fascitis: We reviewed that stretching is critically important to the treatment of PF. Reviewed footwear. Rigid soles have been shown to help with PF. Reviewed rehab of stretching and calf raises.  Could benefit from a corticosteroid injection, orthotics, or other measures if conservative treatment fails.   Health Maintenance Exam: The patient's preventative maintenance and recommended screening tests for an annual wellness exam were reviewed in full today. Brought up to date unless services declined.  Counselled on the importance of diet, exercise, and its role in overall health and mortality. The patient's FH and SH was reviewed, including their home life, tobacco status, and drug and alcohol status.  Follow-up in 1 year for physical exam or additional follow-up below.  Follow-up: No follow-ups on file. Or follow-up in 1 year if not noted.  Signed,  Maud Deed. Yamilette Garretson, MD   Allergies as of 03/20/2018      Reactions   Vicodin [hydrocodone-acetaminophen] Nausea And Vomiting      Medication List       Accurate as of March 20, 2018  9:03 AM. Always use your most recent med list.        acetaminophen 500 MG tablet Commonly known as:  TYLENOL Take 1,000 mg by mouth every 6 (six) hours as needed.   diclofenac sodium 1 % Gel Commonly known as:  VOLTAREN Apply 4 g topically 4 (four) times daily.   enalapril 20 MG tablet Commonly known as:  VASOTEC TAKE TWO TABLETS BY MOUTH ONCE DAILY   ketoconazole 2 % cream Commonly known as:  NIZORAL Apply 1 application topically daily as needed for irritation.   Melatonin 3 MG Tabs Take 12 mg by mouth at bedtime as needed (sleep).   omeprazole 20 MG capsule Commonly known as:  PRILOSEC Take 1 capsule (20 mg total) by mouth  daily.   traMADol 50 MG tablet Commonly known as:  ULTRAM Take 50 mg by mouth every 6 (six) hours as needed.

## 2018-03-20 ENCOUNTER — Ambulatory Visit (INDEPENDENT_AMBULATORY_CARE_PROVIDER_SITE_OTHER): Payer: BLUE CROSS/BLUE SHIELD | Admitting: Family Medicine

## 2018-03-20 ENCOUNTER — Encounter: Payer: Self-pay | Admitting: Family Medicine

## 2018-03-20 VITALS — BP 140/70 | HR 66 | Temp 97.7°F | Ht 71.5 in | Wt 255.2 lb

## 2018-03-20 DIAGNOSIS — Z0000 Encounter for general adult medical examination without abnormal findings: Secondary | ICD-10-CM | POA: Diagnosis not present

## 2018-03-20 DIAGNOSIS — I712 Thoracic aortic aneurysm, without rupture, unspecified: Secondary | ICD-10-CM | POA: Insufficient documentation

## 2018-03-20 DIAGNOSIS — C44209 Unspecified malignant neoplasm of skin of left ear and external auricular canal: Secondary | ICD-10-CM

## 2018-03-20 NOTE — Patient Instructions (Signed)
REFERRALS TO SPECIALISTS, SPECIAL TESTS (MRI, CT, ULTRASOUNDS)  [facility] or  Anastasiya will help you. ASK CHECK-IN FOR HELP.  Specialist appointment times vary a great deal, based on their schedule / openings. -- Some specialists have very long wait times. (Example. Dermatology)    

## 2018-03-21 ENCOUNTER — Ambulatory Visit: Payer: BLUE CROSS/BLUE SHIELD | Admitting: Family Medicine

## 2018-06-06 ENCOUNTER — Other Ambulatory Visit: Payer: Self-pay | Admitting: Family Medicine

## 2018-07-10 ENCOUNTER — Other Ambulatory Visit: Payer: Self-pay | Admitting: Family Medicine

## 2018-10-01 NOTE — Progress Notes (Addendum)
Ameshia Pewitt T. Rubena Roseman, MD Primary Care and [place] at [facility] [place], [address] Phone: [phone]  FAX: [place] - 63 y.o. male  MRN [id number]  Date of Birth: [birth year]  Visit Date: 10/02/2018  PCP: Owens Loffler, MD  Referred by: Owens Loffler, MD  Chief Complaint  Patient presents with  . Fatigue    x 3 to 4 months.   Subjective:   CARLYN MULLENBACH is a 63 y.o. very pleasant male patient who presents with the following:  Patient presents with ongoing fatigue.   Basic labs Test  ? osa  Wife got critically sick in march, and stayed in the hospital for nine days.  Hospital for lockdown.  Came hom ewitn an iv.  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  Had to change up his life a lot.   Flat on her back for four or five months?  May be his age.  Snoring?    BP high this morning.  7-8 hours of sleep a night, up 2-3 time a night to urinate.  Working still 35 hours a week at his job.   Losing his erection some.  End of April.   BP Readings from Last 3 Encounters:  10/02/18 (!) 178/76  03/20/18 140/70  03/07/18 (!) 150/80     Past Medical History, Surgical History, Social History, Family History, Problem List, Medications, and Allergies have been reviewed and updated if relevant.  Patient Active Problem List   Diagnosis Date Noted  . Thoracic aortic aneurysm without rupture ([place]) 03/20/2018  . Status post total hip replacement, left 08/30/2017  . Renal cell carcinoma of right kidney ([place]) 07/30/2015  . S/p RIGHT nephrectomy 07/30/2015  . COLONIC POLYPS, HX OF 08/07/2008  . HYPERLIPIDEMIA 05/18/2007  . HTN (hypertension) 05/18/2007  . HYPERGLYCEMIA 05/18/2007  . CARCINOMA, BASAL CELL, HX OF 05/18/2007    Past Medical History:  Diagnosis Date  . Arthritis   . Basal cell carcinoma    bASAL CELL  . Chronic kidney disease   . GERD (gastroesophageal reflux disease)   .  History of colonic polyps   . History of kidney stones   . Hyperlipidemia   . Nephrolithiasis   . Other and unspecified hyperlipidemia   . PONV (postoperative nausea and vomiting)   . Renal cell carcinoma of right kidney ([place]) 07/30/2015   Nephrectomy  . S/p nephrectomy 07/30/2015  . Unspecified essential hypertension     Past Surgical History:  Procedure Laterality Date  . arthroscopy of knee Right 1989  . CYSTOSCOPY WITH STENT PLACEMENT Left 06/11/2015   Procedure: CYSTOSCOPY WITH STENT PLACEMENT/ bilateral retrograde pyelogram;  Surgeon: [name], MD;  Location: [facility];  Service: Urology;  Laterality: Left;  . [place] 2001&2006   3 times  . LAPAROSCOPIC NEPHRECTOMY, HAND ASSISTED Right 07/09/2015   Procedure: HAND ASSISTED LAPAROSCOPIC NEPHRECTOMY;  Surgeon: [name], MD;  Location: [facility] OR[facility];  Service: Urology;  Laterality: Right;  . MOHS SURGERY Left 04/2015   Ear, [facility]  . SHOULDER ARTHROSCOPY W/ SUBACROMIAL DECOMPRESSION AND DISTAL CLAVICLE EXCISION Left 08-27-2007  . TOTAL HIP ARTHROPLASTY Left 08/30/2017   Procedure: TOTAL HIP ARTHROPLASTY ANTERIOR APPROACH;  Surgeon: Hessie Knows, MD;  Location: [facility];  Service: Orthopedics;  Laterality: Left;  . URETEROSCOPY WITH HOLMIUM LASER LITHOTRIPSY Bilateral 06/11/2015   Procedure: URETEROSCOPY WITH HOLMIUM LASER LITHOTRIPSY;  Surgeon: [name], MD;  Location:  [facility];  Service: Urology;  Laterality: Bilateral;  . VASECTOMY      Social History   Socioeconomic History  . Marital status: Married    Spouse name: Not on file  . Number of children: 2  . Years of education: Not on file  . Highest education level: Not on file  Occupational History  . Occupation: Geographical information systems officer  . Financial resource strain: Not on file  . Food insecurity    Worry: Not on file    Inability: Not on file  . Transportation needs    Medical: Not on file    Non-medical: Not on file  Tobacco Use   . Smoking status: Former Smoker    Types: Cigars  . Smokeless tobacco: Never Used  Substance and Sexual Activity  . Alcohol use: Yes    Alcohol/week: 0.0 standard drinks    Comment: rarely  . Drug use: No  . Sexual activity: Not on file  Lifestyle  . Physical activity    Days per week: Not on file    Minutes per session: Not on file  . Stress: Not on file  Relationships  . Social Herbalist on phone: Not on file    Gets together: Not on file    Attends religious service: Not on file    Active member of club or organization: Not on file    Attends meetings of clubs or organizations: Not on file    Relationship status: Not on file  . Intimate partner violence    Fear of current or ex partner: Not on file    Emotionally abused: Not on file    Physically abused: Not on file    Forced sexual activity: Not on file  Other Topics Concern  . Not on file  Social History Narrative  . Not on file    Family History  Problem Relation Age of Onset  . Cancer Mother        bladder  . Stroke Mother 30  . Diabetes Father   . Hyperlipidemia Father   . Coronary artery disease Father   . Cancer Paternal Uncle        brain  . Stroke Maternal Grandfather   . Cancer Paternal Grandfather        lung  . Prostate cancer Neg Hx   . Colon cancer Neg Hx     Allergies  Allergen Reactions  . Vicodin [Hydrocodone-Acetaminophen] Nausea And Vomiting    Medication list reviewed and updated in full in [place].   GEN: No acute illnesses, no fevers, chills. GI: No n/v/d, eating normally Pulm: No SOB Interactive and getting along well at home.  Otherwise, ROS is as per the HPI.  Objective:   BP (!) 178/76   Pulse 63   Temp 98.8 F (37.1 C)   Resp 18   Ht 5' 11.5" (1.816 m)   Wt 249 lb 8 oz (113.2 kg)   SpO2 98%   BMI 34.31 kg/m   GEN: WDWN, NAD, Non-toxic, A & O x 3 HEENT: Atraumatic, Normocephalic. Neck supple. No masses, No LAD. Ears and Nose: No external  deformity. CV: RRR, No M/G/R. No JVD. No thrill. No extra heart sounds. PULM: CTA B, no wheezes, crackles, rhonchi. No retractions. No resp. distress. No accessory muscle use. EXTR: No c/c/e NEURO Normal gait.  PSYCH: Normally interactive. Conversant. Not depressed or anxious appearing.  Calm demeanor.   Laboratory and Imaging Data:  Assessment  and Plan:     ICD-10-CM   1. Other fatigue  P38.25 Basic metabolic panel    CBC with Differential/Platelet    Hepatitis panel, acute    TSH    Testos,Total,Free and SHBG (Male)    Vitamin B12    VITAMIN D 25 Hydroxy (Vit-D Deficiency, Fractures)    Hemoglobin A1c  2. Essential hypertension  I10   3. Low libido  R68.82    Exceptionally stressed out without any signs of depression.  Caregiver stress and decreased sleep.  Basic lab workout  Patient Instructions  Blood pressure:  Check twice a week in the morning, and let me know after about a month what it is    Follow-up: No follow-ups on file.  No orders of the defined types were placed in this encounter.  Orders Placed This Encounter  Procedures  . Basic metabolic panel  . CBC with Differential/Platelet  . Hepatitis panel, acute  . TSH  . Testos,Total,Free and SHBG (Male)  . Vitamin B12  . VITAMIN D 25 Hydroxy (Vit-D Deficiency, Fractures)  . Hemoglobin A1c    Signed,  Curstin Schmale T. Rashea Hoskie, MD   Outpatient Encounter Medications as of 10/02/2018  Medication Sig  . acetaminophen (TYLENOL) 500 MG tablet Take 1,000 mg by mouth every 6 (six) hours as needed.  . diclofenac sodium (VOLTAREN) 1 % GEL Apply 4 g topically 4 (four) times daily.  . enalapril (VASOTEC) 20 MG tablet Take 2 tablets by mouth once daily  . ketoconazole (NIZORAL) 2 % cream Apply 1 application topically daily as needed for irritation.  . Melatonin 3 MG TABS Take 12 mg by mouth at bedtime as needed (sleep).   Marland Kitchen omeprazole (PRILOSEC) 20 MG capsule Take 1 capsule by mouth once daily  . traMADol (ULTRAM)  50 MG tablet Take 50 mg by mouth every 6 (six) hours as needed.   No facility-administered encounter medications on file as of 10/02/2018.

## 2018-10-02 ENCOUNTER — Ambulatory Visit (INDEPENDENT_AMBULATORY_CARE_PROVIDER_SITE_OTHER): Payer: BC Managed Care – PPO | Admitting: Family Medicine

## 2018-10-02 ENCOUNTER — Other Ambulatory Visit: Payer: Self-pay

## 2018-10-02 ENCOUNTER — Encounter: Payer: Self-pay | Admitting: Family Medicine

## 2018-10-02 VITALS — BP 178/76 | HR 63 | Temp 98.8°F | Resp 18 | Ht 71.5 in | Wt 249.5 lb

## 2018-10-02 DIAGNOSIS — R5383 Other fatigue: Secondary | ICD-10-CM | POA: Diagnosis not present

## 2018-10-02 DIAGNOSIS — R6882 Decreased libido: Secondary | ICD-10-CM | POA: Diagnosis not present

## 2018-10-02 DIAGNOSIS — I10 Essential (primary) hypertension: Secondary | ICD-10-CM | POA: Diagnosis not present

## 2018-10-02 LAB — CBC WITH DIFFERENTIAL/PLATELET
Basophils Absolute: 0 10*3/uL (ref 0.0–0.1)
Basophils Relative: 0.6 % (ref 0.0–3.0)
Eosinophils Absolute: 0.1 10*3/uL (ref 0.0–0.7)
Eosinophils Relative: 1.4 % (ref 0.0–5.0)
HCT: 46.8 % (ref 39.0–52.0)
Hemoglobin: 15.9 g/dL (ref 13.0–17.0)
Lymphocytes Relative: 21.6 % (ref 12.0–46.0)
Lymphs Abs: 1.8 10*3/uL (ref 0.7–4.0)
MCHC: 34.1 g/dL (ref 30.0–36.0)
MCV: 91.1 fl (ref 78.0–100.0)
Monocytes Absolute: 0.6 10*3/uL (ref 0.1–1.0)
Monocytes Relative: 7.2 % (ref 3.0–12.0)
Neutro Abs: 5.6 10*3/uL (ref 1.4–7.7)
Neutrophils Relative %: 69.2 % (ref 43.0–77.0)
Platelets: 252 10*3/uL (ref 150.0–400.0)
RBC: 5.14 Mil/uL (ref 4.22–5.81)
RDW: 13 % (ref 11.5–15.5)
WBC: 8.1 10*3/uL (ref 4.0–10.5)

## 2018-10-02 LAB — BASIC METABOLIC PANEL
BUN: 13 mg/dL (ref 6–23)
CO2: 28 mEq/L (ref 19–32)
Calcium: 9.2 mg/dL (ref 8.4–10.5)
Chloride: 105 mEq/L (ref 96–112)
Creatinine, Ser: 1.02 mg/dL (ref 0.40–1.50)
GFR: 73.52 mL/min (ref >60.00)
Glucose, Bld: 108 mg/dL — ABNORMAL HIGH (ref 70–99)
Potassium: 4.6 mEq/L (ref 3.5–5.1)
Sodium: 143 mEq/L (ref 135–145)

## 2018-10-02 LAB — VITAMIN B12: Vitamin B-12: 252 pg/mL (ref 211–911)

## 2018-10-02 LAB — VITAMIN D 25 HYDROXY (VIT D DEFICIENCY, FRACTURES): VITD: 34.48 ng/mL (ref 30.00–100.00)

## 2018-10-02 LAB — HEMOGLOBIN A1C: Hgb A1c MFr Bld: 5.6 % (ref 4.6–6.5)

## 2018-10-02 LAB — TSH: TSH: 0.56 u[IU]/mL (ref 0.35–4.50)

## 2018-10-02 NOTE — Patient Instructions (Signed)
Blood pressure:  Check twice a week in the morning, and let me know after about a month what it is

## 2018-10-06 LAB — HEPATITIS PANEL, ACUTE
Hep A IgM: NONREACTIVE
Hep B C IgM: NONREACTIVE
Hepatitis B Surface Ag: NONREACTIVE
Hepatitis C Ab: NONREACTIVE
SIGNAL TO CUT-OFF: 0.01 (ref <1.00)

## 2018-10-06 LAB — TESTOS,TOTAL,FREE AND SHBG (FEMALE)
Free Testosterone: 32 pg/mL — ABNORMAL LOW (ref 35.0–155.0)
Sex Hormone Binding: 43 nmol/L (ref 22–77)
Testosterone, Total, LC-MS-MS: 276 ng/dL (ref 250–1100)

## 2018-11-01 ENCOUNTER — Encounter: Payer: Self-pay | Admitting: Family Medicine

## 2018-11-01 ENCOUNTER — Ambulatory Visit (INDEPENDENT_AMBULATORY_CARE_PROVIDER_SITE_OTHER): Payer: BC Managed Care – PPO | Admitting: Family Medicine

## 2018-11-01 ENCOUNTER — Other Ambulatory Visit: Payer: Self-pay

## 2018-11-01 VITALS — BP 150/78 | HR 57 | Temp 99.1°F | Ht 71.5 in | Wt 249.5 lb

## 2018-11-01 DIAGNOSIS — M549 Dorsalgia, unspecified: Secondary | ICD-10-CM | POA: Diagnosis not present

## 2018-11-01 DIAGNOSIS — I10 Essential (primary) hypertension: Secondary | ICD-10-CM | POA: Diagnosis not present

## 2018-11-01 MED ORDER — HYDROCHLOROTHIAZIDE 12.5 MG PO TABS: 12.5000 mg | ORAL_TABLET | Freq: Every day | ORAL | 3 refills | Status: DC

## 2018-11-01 NOTE — Progress Notes (Signed)
Nicholas Thompson T. Brandice Busser, MD Primary Care and [facility] at [facility] [place], [address] Phone: [phone]  FAX: [place] - 63 y.o. male  MRN [id number]  Date of Birth: [birth year]  Visit Date: 11/01/2018  PCP: Nicholas Loffler, MD  Referred by: Nicholas Loffler, MD  Chief Complaint  Patient presents with  . Back Pain   Subjective:   Nicholas Thompson is a 63 y.o. very pleasant male patient with Body mass index is 34.31 kg/m. who presents with the following:  Last week started to get some back pain and needs a note for work.   HTN: Tolerating all medications without side effects Remains too high No CP, no sob. No HA.  BP Readings from Last 3 Encounters:  11/01/18 (!) 150/78  10/02/18 (!) 178/76  03/20/18 935/70    Basic Metabolic Panel:    Component Value Date/Time   NA 143 10/02/2018 0830   K 4.6 10/02/2018 0830   CL 105 10/02/2018 0830   CO2 28 10/02/2018 0830   BUN 13 10/02/2018 0830   CREATININE 1.02 10/02/2018 0830   GLUCOSE 108 (H) 10/02/2018 0830   CALCIUM 9.2 10/02/2018 0830    Tues - Friday  Past Medical History, Surgical History, Social History, Family History, Problem List, Medications, and Allergies have been reviewed and updated if relevant.  Patient Active Problem List   Diagnosis Date Noted  . Thoracic aortic aneurysm without rupture ([place]) 03/20/2018  . Status post total hip replacement, left 08/30/2017  . Renal cell carcinoma of right kidney ([place]) 07/30/2015  . S/p RIGHT nephrectomy 07/30/2015  . COLONIC POLYPS, HX OF 08/07/2008  . HYPERLIPIDEMIA 05/18/2007  . HTN (hypertension) 05/18/2007  . HYPERGLYCEMIA 05/18/2007  . CARCINOMA, BASAL CELL, HX OF 05/18/2007    Past Medical History:  Diagnosis Date  . Arthritis   . Basal cell carcinoma    bASAL CELL  . Chronic kidney disease   . GERD (gastroesophageal reflux disease)   . History of colonic polyps   . History of  kidney stones   . Hyperlipidemia   . Nephrolithiasis   . Other and unspecified hyperlipidemia   . PONV (postoperative nausea and vomiting)   . Renal cell carcinoma of right kidney ([place]) 07/30/2015   Nephrectomy  . S/p nephrectomy 07/30/2015  . Unspecified essential hypertension     Past Surgical History:  Procedure Laterality Date  . arthroscopy of knee Right 1989  . CYSTOSCOPY WITH STENT PLACEMENT Left 06/11/2015   Procedure: CYSTOSCOPY WITH STENT PLACEMENT/ bilateral retrograde pyelogram;  Surgeon: [name], MD;  Location: [facility] ORS;  Service: Urology;  Laterality: Left;  . [place] 2001&2006   3 times  . LAPAROSCOPIC NEPHRECTOMY, HAND ASSISTED Right 07/09/2015   Procedure: HAND ASSISTED LAPAROSCOPIC NEPHRECTOMY;  Surgeon: [name], MD;  Location: [facility] ORS;  Service: Urology;  Laterality: Right;  . MOHS SURGERY Left 04/2015   [facility]ar, [facility]  . SHOULDER ARTHROSCOPY W/ SUBACROMIAL DECOMPRESSION AND DISTAL CLAVICLE EXCISION Left 08-27-2007  . TOTAL HIP ARTHROPLASTY Left 08/30/2017   Procedure: TOTAL HIP ARTHROPLASTY ANTERIOR APPROACH;  Surgeon: Hessie Knows, MD;  Location: [facility] ORS;  Service: Orthopedics;  Laterality: Left;  . URETEROSCOPY WITH HOLMIUM LASER LITHOTRIPSY Bilateral 06/11/2015   Procedure: URETEROSCOPY WITH HOLMIUM LASER LITHOTRIPSY;  Surgeon: [name], MD;  Location: [facility] ORS;  Service: Urology;  Laterality: Bilateral;  . VASECTOMY      Social History  Socioeconomic History  . Marital status: Married    Spouse name: Not on file  . Number of children: 2  . Years of education: Not on file  . Highest education level: Not on file  Occupational History  . Occupation: Geographical information systems officer  . Financial resource strain: Not on file  . Food insecurity    Worry: Not on file    Inability: Not on file  . Transportation needs    Medical: Not on file    Non-medical: Not on file  Tobacco Use  . Smoking status: Former Smoker    Types:  Cigars  . Smokeless tobacco: Never Used  Substance and Sexual Activity  . Alcohol use: Yes    Alcohol/week: 0.0 standard drinks    Comment: rarely  . Drug use: No  . Sexual activity: Not on file  Lifestyle  . Physical activity    Days per week: Not on file    Minutes per session: Not on file  . Stress: Not on file  Relationships  . Social Herbalist on phone: Not on file    Gets together: Not on file    Attends religious service: Not on file    Active member of club or organization: Not on file    Attends meetings of clubs or organizations: Not on file    Relationship status: Not on file  . Intimate partner violence    Fear of current or ex partner: Not on file    Emotionally abused: Not on file    Physically abused: Not on file    Forced sexual activity: Not on file  Other Topics Concern  . Not on file  Social History Narrative  . Not on file    Family History  Problem Relation Age of Onset  . Cancer Mother        bladder  . Stroke Mother 3  . Diabetes Father   . Hyperlipidemia Father   . Coronary artery disease Father   . Cancer Paternal Uncle        brain  . Stroke Maternal Grandfather   . Cancer Paternal Grandfather        lung  . Prostate cancer Neg Hx   . Colon cancer Neg Hx     Allergies  Allergen Reactions  . Vicodin [Hydrocodone-Acetaminophen] Nausea And Vomiting    Medication list reviewed and updated in full in [place].  GEN: No fevers, chills. Nontoxic. Primarily MSK c/o today. MSK: Detailed in the HPI GI: tolerating PO intake without difficulty Neuro: No numbness, parasthesias, or tingling associated. Otherwise the pertinent positives of the ROS are noted above.   Objective:   BP (!) 150/78   Pulse (!) 57   Temp 99.1 F (37.3 C) (Temporal)   Ht 5' 11.5" (1.816 m)   Wt 249 lb 8 oz (113.2 kg)   SpO2 98%   BMI 34.31 kg/m    GEN: WDWN, NAD, Non-toxic, A & O x 3 HEENT: Atraumatic, Normocephalic. Neck supple. No  masses, No LAD. Ears and Nose: No external deformity. CV: RRR, No M/G/R. No JVD. No thrill. No extra heart sounds. PULM: CTA B, no wheezes, crackles, rhonchi. No retractions. No resp. distress. No accessory muscle use. EXTR: No c/c/e NEURO Normal gait.  PSYCH: Normally interactive. Conversant. Not depressed or anxious appearing.  Calm demeanor.   Back globally NT to palpation  Radiology: No results found.  Assessment and Plan:     ICD-10-CM  1. Essential hypertension  I10   2. Acute back pain, unspecified back location, unspecified back pain laterality  M54.9    Mild back pain, RTW Fri Add BP meds  Follow-up: No follow-ups on file.  Meds ordered this encounter  Medications  . hydrochlorothiazide (HYDRODIURIL) 12.5 MG tablet    Sig: Take 1 tablet (12.5 mg total) by mouth daily.    Dispense:  30 tablet    Refill:  3   No orders of the defined types were placed in this encounter.   Signed,  Maud Deed. Katrice Goel, MD   Outpatient Encounter Medications as of 11/01/2018  Medication Sig  . acetaminophen (TYLENOL) 500 MG tablet Take 1,000 mg by mouth every 6 (six) hours as needed.  . diclofenac sodium (VOLTAREN) 1 % GEL Apply 4 g topically 4 (four) times daily.  . enalapril (VASOTEC) 20 MG tablet Take 2 tablets by mouth once daily  . ketoconazole (NIZORAL) 2 % cream Apply 1 application topically daily as needed for irritation.  . Melatonin 3 MG TABS Take 12 mg by mouth at bedtime as needed (sleep).   Marland Kitchen omeprazole (PRILOSEC) 20 MG capsule Take 1 capsule by mouth once daily  . traMADol (ULTRAM) 50 MG tablet Take 50 mg by mouth every 6 (six) hours as needed.  . hydrochlorothiazide (HYDRODIURIL) 12.5 MG tablet Take 1 tablet (12.5 mg total) by mouth daily.   No facility-administered encounter medications on file as of 11/01/2018.

## 2019-01-21 ENCOUNTER — Other Ambulatory Visit: Payer: Self-pay | Admitting: Family Medicine

## 2019-01-30 ENCOUNTER — Other Ambulatory Visit: Payer: Self-pay | Admitting: *Deleted

## 2019-01-30 MED ORDER — HYDROCHLOROTHIAZIDE 12.5 MG PO TABS: 12.5000 mg | ORAL_TABLET | Freq: Every day | ORAL | 1 refills | Status: DC

## 2019-02-07 ENCOUNTER — Ambulatory Visit: Payer: BLUE CROSS/BLUE SHIELD | Admitting: Urology

## 2019-02-28 ENCOUNTER — Other Ambulatory Visit: Payer: Self-pay | Admitting: Family Medicine

## 2019-02-28 NOTE — Telephone Encounter (Signed)
Last office visit 11/01/2018 for Back Pain & HTN.  Last refilled ??  CPE scheduled for 03/28/2019.

## 2019-03-13 ENCOUNTER — Other Ambulatory Visit: Payer: Self-pay | Admitting: Family Medicine

## 2019-03-13 DIAGNOSIS — Z0000 Encounter for general adult medical examination without abnormal findings: Secondary | ICD-10-CM

## 2019-03-21 ENCOUNTER — Other Ambulatory Visit: Payer: Self-pay

## 2019-03-21 ENCOUNTER — Other Ambulatory Visit (INDEPENDENT_AMBULATORY_CARE_PROVIDER_SITE_OTHER): Payer: BC Managed Care – PPO

## 2019-03-21 DIAGNOSIS — Z0000 Encounter for general adult medical examination without abnormal findings: Secondary | ICD-10-CM | POA: Diagnosis not present

## 2019-03-21 NOTE — Addendum Note (Signed)
Addended by: Ellamae Sia on: 03/21/2019 07:44 AM   Modules accepted: Orders

## 2019-03-22 LAB — CBC WITH DIFFERENTIAL/PLATELET
Absolute Monocytes: 782 cells/uL (ref 200–950)
Basophils Absolute: 37 cells/uL (ref 0–200)
Basophils Relative: 0.4 %
Eosinophils Absolute: 230 cells/uL (ref 15–500)
Eosinophils Relative: 2.5 %
HCT: 47.1 % (ref 38.5–50.0)
Hemoglobin: 16.2 g/dL (ref 13.2–17.1)
Lymphs Abs: 2364 cells/uL (ref 850–3900)
MCH: 31 pg (ref 27.0–33.0)
MCHC: 34.4 g/dL (ref 32.0–36.0)
MCV: 90.1 fL (ref 80.0–100.0)
MPV: 9.6 fL (ref 7.5–12.5)
Monocytes Relative: 8.5 %
Neutro Abs: 5787 cells/uL (ref 1500–7800)
Neutrophils Relative %: 62.9 %
Platelets: 248 10*3/uL (ref 140–400)
RBC: 5.23 10*6/uL (ref 4.20–5.80)
RDW: 12.4 % (ref 11.0–15.0)
Total Lymphocyte: 25.7 %
WBC: 9.2 10*3/uL (ref 3.8–10.8)

## 2019-03-22 LAB — BASIC METABOLIC PANEL
BUN: 15 mg/dL (ref 7–25)
CO2: 29 mmol/L (ref 20–32)
Calcium: 9.6 mg/dL (ref 8.6–10.3)
Chloride: 102 mmol/L (ref 98–110)
Creat: 1.18 mg/dL (ref 0.70–1.25)
Glucose, Bld: 99 mg/dL (ref 65–99)
Potassium: 4.3 mmol/L (ref 3.5–5.3)
Sodium: 141 mmol/L (ref 135–146)

## 2019-03-22 LAB — HEMOGLOBIN A1C
Hgb A1c MFr Bld: 5.4 % of total Hgb (ref <5.7)
Mean Plasma Glucose: 108 (calc)
eAG (mmol/L): 6 (calc)

## 2019-03-22 LAB — LIPID PANEL
Cholesterol: 268 mg/dL — ABNORMAL HIGH (ref <200)
HDL: 45 mg/dL (ref >=40)
Non-HDL Cholesterol (Calc): 223 mg/dL (calc) — ABNORMAL HIGH (ref <130)
Total CHOL/HDL Ratio: 6 (calc) — ABNORMAL HIGH (ref <5.0)
Triglycerides: 468 mg/dL — ABNORMAL HIGH (ref <150)

## 2019-03-22 LAB — HEPATIC FUNCTION PANEL
AG Ratio: 1.7 (calc) (ref 1.0–2.5)
ALT: 25 U/L (ref 9–46)
AST: 21 U/L (ref 10–35)
Albumin: 4.2 g/dL (ref 3.6–5.1)
Alkaline phosphatase (APISO): 79 U/L (ref 35–144)
Bilirubin, Direct: 0.1 mg/dL (ref 0.0–0.2)
Globulin: 2.5 g/dL (calc) (ref 1.9–3.7)
Indirect Bilirubin: 0.3 mg/dL (calc) (ref 0.2–1.2)
Total Bilirubin: 0.4 mg/dL (ref 0.2–1.2)
Total Protein: 6.7 g/dL (ref 6.1–8.1)

## 2019-03-22 LAB — PSA, TOTAL WITH REFLEX TO PSA, FREE: PSA, Total: 2.1 ng/mL (ref <=4.0)

## 2019-03-27 NOTE — Progress Notes (Signed)
Nicholas Thompson T. Halana Deisher, MD Primary Care and [place] at [facility] [place], [address] Phone: [phone]  FAX: [place] - 64 y.o. male  MRN [name]  Date of Birth: 1955-09-29  Visit Date: 03/28/2019  PCP: Owens Loffler, MD  Referred by: Owens Loffler, MD  Chief Complaint  Patient presents with  . Annual Exam    This visit occurred during the SARS-CoV-2 public health emergency.  Safety protocols were in place, including screening questions prior to the visit, additional usage of staff PPE, and extensive cleaning of exam room while observing appropriate contact time as indicated for disinfecting solutions.   Patient Care Team: Owens Loffler, MD as PCP - General Subjective:   Nicholas Thompson is a 64 y.o. pleasant patient who presents with the following:  Preventative Health Maintenance Visit:  Health Maintenance Summary Reviewed and updated, unless pt declines services.  Tobacco History Reviewed. Alcohol: No concerns, no excessive use Exercise Habits: Some activity, rec at least 30 mins 5 times a week STD concerns: no risk or activity to increase risk Drug Use: None Encouraged self-testicular check  L total hip arthroplasty. Finger OA  L knee has a spot on it that is sore.  Medial compartmental OA    Health Maintenance  Topic Date Due  . COLONOSCOPY  01/26/2021  . TETANUS/TDAP  05/25/2021  . INFLUENZA VACCINE  Completed  . Hepatitis C Screening  Completed  . HIV Screening  Completed   Immunization History  Administered Date(s) Administered  . Influenza,inj,Quad PF,6+ Mos 01/11/2013, 01/30/2015, 02/27/2016, 05/18/2017, 01/11/2018  . Influenza-Unspecified 11/21/2018  . Td 09/12/1998  . Tdap 05/26/2011   Patient Active Problem List   Diagnosis Date Noted  . Thoracic aortic aneurysm without rupture ([place]) 03/20/2018  . Status post total hip replacement, left 08/30/2017  .  Renal cell carcinoma of right kidney ([place]) 07/30/2015  . S/p RIGHT nephrectomy 07/30/2015  . COLONIC POLYPS, HX OF 08/07/2008  . HYPERLIPIDEMIA 05/18/2007  . HTN (hypertension) 05/18/2007  . HYPERGLYCEMIA 05/18/2007  . CARCINOMA, BASAL CELL, HX OF 05/18/2007   Past Medical History:  Diagnosis Date  . Arthritis   . Basal cell carcinoma    bASAL CELL  . Chronic kidney disease   . GERD (gastroesophageal reflux disease)   . History of colonic polyps   . History of kidney stones   . Hyperlipidemia   . Nephrolithiasis   . Other and unspecified hyperlipidemia   . PONV (postoperative nausea and vomiting)   . Renal cell carcinoma of right kidney ([place]) 07/30/2015   Nephrectomy  . S/p nephrectomy 07/30/2015  . Unspecified essential hypertension    Past Surgical History:  Procedure Laterality Date  . arthroscopy of knee Right 1989  . CYSTOSCOPY WITH STENT PLACEMENT Left 06/11/2015   Procedure: CYSTOSCOPY WITH STENT PLACEMENT/ bilateral retrograde pyelogram;  Surgeon: [name], MD;  Location: [facility] ORS;  Service: Urology;  Laterality: Left;  . [place] 2001&2006   3 times  . LAPAROSCOPIC NEPHRECTOMY, HAND ASSISTED Right 07/09/2015   Procedure: HAND ASSISTED LAPAROSCOPIC NEPHRECTOMY;  Surgeon: [name], MD;  Location: [facility] ORS;  Service: Urology;  Laterality: Right;  . MOHS SURGERY Left 04/2015   [facility]ar, [facility]  . SHOULDER ARTHROSCOPY W/ SUBACROMIAL DECOMPRESSION AND DISTAL CLAVICLE EXCISION Left 08-27-2007  . TOTAL HIP ARTHROPLASTY Left 08/30/2017   Procedure: TOTAL HIP ARTHROPLASTY ANTERIOR APPROACH;  Surgeon: Hessie Knows, MD;  Location: [facility]  ORS;  Service: Orthopedics;  Laterality: Left;  . URETEROSCOPY WITH HOLMIUM LASER LITHOTRIPSY Bilateral 06/11/2015   Procedure: URETEROSCOPY WITH HOLMIUM LASER LITHOTRIPSY;  Surgeon: [name], MD;  Location: [facility] ORS;  Service: Urology;  Laterality: Bilateral;  . VASECTOMY     Social History   Socioeconomic  History  . Marital status: Married    Spouse name: Not on file  . Number of children: 2  . Years of education: Not on file  . Highest education level: Not on file  Occupational History  . Occupation: Press photographer  Tobacco Use  . Smoking status: Former Smoker    Types: Cigars  . Smokeless tobacco: Never Used  Substance and Sexual Activity  . Alcohol use: Yes    Alcohol/week: 0.0 standard drinks    Comment: rarely  . Drug use: No  . Sexual activity: Not on file  Other Topics Concern  . Not on file  Social History Narrative  . Not on file   Social Determinants of Health   Financial Resource Strain:   . Difficulty of Paying Living Expenses: Not on file  Food Insecurity:   . Worried About Charity fundraiser in the Last Year: Not on file  . Ran Out of Food in the Last Year: Not on file  Transportation Needs:   . Lack of Transportation (Medical): Not on file  . Lack of Transportation (Non-Medical): Not on file  Physical Activity:   . Days of Exercise per Week: Not on file  . Minutes of Exercise per Session: Not on file  Stress:   . Feeling of Stress : Not on file  Social Connections:   . Frequency of Communication with Friends and Family: Not on file  . Frequency of Social Gatherings with Friends and Family: Not on file  . Attends Religious Services: Not on file  . Active Member of Clubs or Organizations: Not on file  . Attends Archivist Meetings: Not on file  . Marital Status: Not on file  Intimate Partner Violence:   . Fear of Current or Ex-Partner: Not on file  . Emotionally Abused: Not on file  . Physically Abused: Not on file  . Sexually Abused: Not on file   Family History  Problem Relation Age of Onset  . Cancer Mother        bladder  . Stroke Mother 31  . Diabetes Father   . Hyperlipidemia Father   . Coronary artery disease Father   . Cancer Paternal Uncle        brain  . Stroke Maternal Grandfather   . Cancer Paternal Grandfather        lung  .  Prostate cancer Neg Hx   . Colon cancer Neg Hx    Allergies  Allergen Reactions  . Vicodin [Hydrocodone-Acetaminophen] Nausea And Vomiting    Medication list has been reviewed and updated.   General: Denies fever, chills, sweats. No significant weight loss. Eyes: Denies blurring,significant itching ENT: Denies earache, sore throat, and hoarseness. Cardiovascular: Denies chest pains, palpitations, dyspnea on exertion Respiratory: Denies cough, dyspnea at rest,wheeezing Breast: no concerns about lumps GI: Denies nausea, vomiting, diarrhea, constipation, change in bowel habits, abdominal pain, melena, hematochezia GU: Denies penile discharge, ED, urinary flow / outflow problems. No STD concerns. Musculoskeletal: above Derm: Denies rash, itching Neuro: Denies  paresthesias, frequent falls, frequent headaches Psych: Denies depression, anxiety Endocrine: Denies cold intolerance, heat intolerance, polydipsia Heme: Denies enlarged lymph nodes Allergy: No hayfever  Objective:  BP (!) 147/69   Pulse 76   Temp 98.1 F (36.7 C) (Temporal)   Ht 5' 11.5" (1.816 m)   Wt 249 lb 4 oz (113.1 kg)   SpO2 99%   BMI 34.28 kg/m  Ideal Body Weight: Weight in (lb) to have BMI = 25: 181.4  Ideal Body Weight: Weight in (lb) to have BMI = 25: 181.4 No exam data present Depression screen [facility] 2/9 03/28/2019 03/20/2018  Decreased Interest 0 0  Down, Depressed, Hopeless 0 0  PHQ - 2 Score 0 0     GEN: well developed, well nourished, no acute distress Eyes: conjunctiva and lids normal, PERRLA, EOMI ENT: TM clear, nares clear, oral exam WNL Neck: supple, no lymphadenopathy, no thyromegaly, no JVD Pulm: clear to auscultation and percussion, respiratory effort normal CV: regular rate and rhythm, S1-S2, no murmur, rub or gallop, no bruits, peripheral pulses normal and symmetric, no cyanosis, clubbing, edema or varicosities GI: soft, non-tender; no hepatosplenomegaly, masses; active bowel sounds all  quadrants GU: no hernia, testicular mass, penile discharge Lymph: no cervical, axillary or inguinal adenopathy MSK: L medial joint line tender with ROM to 120 flexion.  Hip with good ROM SKIN: clear, good turgor, color WNL, no rashes, lesions, or ulcerations Neuro: normal mental status, normal strength, sensation, and motion Psych: alert; oriented to person, place and time, normally interactive and not anxious or depressed in appearance. All labs reviewed with patient. Results for orders placed or performed in visit on 03/21/19  Lipid Profile  Result Value Ref Range   Cholesterol 268 (H) <200 mg/dL   HDL 45 > OR = 40 mg/dL   Triglycerides 468 (H) <150 mg/dL   LDL Cholesterol (Calc)  mg/dL (calc)   Total CHOL/HDL Ratio 6.0 (H) <5.0 (calc)   Non-HDL Cholesterol (Calc) 223 (H) <130 mg/dL (calc)  CBC with Differential/Platelet  Result Value Ref Range   WBC 9.2 3.8 - 10.8 Thousand/uL   RBC 5.23 4.20 - 5.80 Million/uL   Hemoglobin 16.2 13.2 - 17.1 g/dL   HCT 47.1 38.5 - 50.0 %   MCV 90.1 80.0 - 100.0 fL   MCH 31.0 27.0 - 33.0 pg   MCHC 34.4 32.0 - 36.0 g/dL   RDW 12.4 11.0 - 15.0 %   Platelets 248 140 - 400 Thousand/uL   MPV 9.6 7.5 - 12.5 fL   Neutro Abs 5,787 1,500 - 7,800 cells/uL   Lymphs Abs 2,364 850 - 3,900 cells/uL   Absolute Monocytes 782 200 - 950 cells/uL   Eosinophils Absolute 230 15 - 500 cells/uL   Basophils Absolute 37 0 - 200 cells/uL   Neutrophils Relative % 62.9 %   Total Lymphocyte 25.7 %   Monocytes Relative 8.5 %   Eosinophils Relative 2.5 %   Basophils Relative 0.4 %  Liver Profile  Result Value Ref Range   Total Protein 6.7 6.1 - 8.1 g/dL   Albumin 4.2 3.6 - 5.1 g/dL   Globulin 2.5 1.9 - 3.7 g/dL (calc)   AG Ratio 1.7 1.0 - 2.5 (calc)   Total Bilirubin 0.4 0.2 - 1.2 mg/dL   Bilirubin, Direct 0.1 0.0 - 0.2 mg/dL   Indirect Bilirubin 0.3 0.2 - 1.2 mg/dL (calc)   Alkaline phosphatase (APISO) 79 35 - 144 U/L   AST 21 10 - 35 U/L   ALT 25 9 - 46 U/L    Basic metabolic panel  Result Value Ref Range   Glucose, Bld 99 65 - 99 mg/dL   BUN 15  7 - 25 mg/dL   Creat 1.18 0.70 - 1.25 mg/dL   BUN/Creatinine Ratio NOT APPLICABLE 6 - 22 (calc)   Sodium 141 135 - 146 mmol/L   Potassium 4.3 3.5 - 5.3 mmol/L   Chloride 102 98 - 110 mmol/L   CO2 29 20 - 32 mmol/L   Calcium 9.6 8.6 - 10.3 mg/dL  Hemoglobin A1c  Result Value Ref Range   Hgb A1c MFr Bld 5.4 <5.7 % of total Hgb   Mean Plasma Glucose 108 (calc)   eAG (mmol/L) 6.0 (calc)  PSA, Total with Reflex to PSA, Free  Result Value Ref Range   PSA, Total 2.1 < OR = 4.0 ng/mL    Assessment and Plan:     ICD-10-CM   1. Healthcare maintenance  Z00.00    Wife with many health problems this year - hasn't been following a good diet and spent most of time caring for his wife.  Return to old behaviors and would expect chol to return to baseline.  Health Maintenance Exam: The patient's preventative maintenance and recommended screening tests for an annual wellness exam were reviewed in full today. Brought up to date unless services declined.  Counselled on the importance of diet, exercise, and its role in overall health and mortality. The patient's FH and SH was reviewed, including their home life, tobacco status, and drug and alcohol status.  Follow-up in 1 year for physical exam or additional follow-up below.  Follow-up: No follow-ups on file. Or follow-up in 1 year if not noted.  Meds ordered this encounter  Medications  . traMADol (ULTRAM) 50 MG tablet    Sig: Take 1 tablet (50 mg total) by mouth every 6 (six) hours as needed.    Dispense:  40 tablet    Refill:  0   Medications Discontinued During This Encounter  Medication Reason  . traMADol (ULTRAM) 50 MG tablet Reorder   No orders of the defined types were placed in this encounter.   Signed,  Maud Deed. Doris Gruhn, MD   Allergies as of 03/28/2019      Reactions   Vicodin [hydrocodone-acetaminophen] Nausea And Vomiting       Medication List       Accurate as of March 28, 2019  8:59 AM. If you have any questions, ask your nurse or doctor.        acetaminophen 500 MG tablet Commonly known as: TYLENOL Take 1,000 mg by mouth every 6 (six) hours as needed.   diclofenac sodium 1 % Gel Commonly known as: VOLTAREN Apply 4 g topically 4 (four) times daily.   enalapril 20 MG tablet Commonly known as: VASOTEC Take 2 tablets by mouth once daily   hydrochlorothiazide 12.5 MG tablet Commonly known as: HYDRODIURIL Take 1 tablet (12.5 mg total) by mouth daily.   ketoconazole 2 % cream Commonly known as: NIZORAL Apply 1 application topically daily as needed for irritation.   Melatonin 3 MG Tabs Take 12 mg by mouth at bedtime as needed (sleep).   omeprazole 20 MG capsule Commonly known as: PRILOSEC Take 1 capsule by mouth once daily   traMADol 50 MG tablet Commonly known as: ULTRAM Take 1 tablet (50 mg total) by mouth every 6 (six) hours as needed.

## 2019-03-28 ENCOUNTER — Encounter: Payer: Self-pay | Admitting: Family Medicine

## 2019-03-28 ENCOUNTER — Other Ambulatory Visit: Payer: Self-pay

## 2019-03-28 ENCOUNTER — Ambulatory Visit (INDEPENDENT_AMBULATORY_CARE_PROVIDER_SITE_OTHER): Payer: BC Managed Care – PPO | Admitting: Family Medicine

## 2019-03-28 VITALS — BP 147/69 | HR 76 | Temp 98.1°F | Ht 71.5 in | Wt 249.2 lb

## 2019-03-28 DIAGNOSIS — Z0000 Encounter for general adult medical examination without abnormal findings: Secondary | ICD-10-CM | POA: Diagnosis not present

## 2019-03-28 MED ORDER — TRAMADOL HCL 50 MG PO TABS: 50.0000 mg | ORAL_TABLET | Freq: Four times a day (QID) | ORAL | 0 refills | Status: DC | PRN

## 2019-04-23 ENCOUNTER — Other Ambulatory Visit: Payer: Self-pay | Admitting: Family Medicine

## 2019-04-25 ENCOUNTER — Encounter: Payer: Self-pay | Admitting: Family Medicine

## 2019-04-25 ENCOUNTER — Ambulatory Visit (INDEPENDENT_AMBULATORY_CARE_PROVIDER_SITE_OTHER)
Admission: RE | Admit: 2019-04-25 | Discharge: 2019-04-25 | Disposition: A | Discharge status: Home / Self Care | Payer: BC Managed Care – PPO | Source: Ambulatory Visit | Attending: Family Medicine | Admitting: Family Medicine

## 2019-04-25 ENCOUNTER — Ambulatory Visit (INDEPENDENT_AMBULATORY_CARE_PROVIDER_SITE_OTHER): Payer: BC Managed Care – PPO | Admitting: Family Medicine

## 2019-04-25 ENCOUNTER — Other Ambulatory Visit: Payer: Self-pay

## 2019-04-25 ENCOUNTER — Telehealth: Payer: Self-pay

## 2019-04-25 VITALS — BP 148/72 | HR 64 | Temp 98.1°F | Ht 71.5 in | Wt 251.8 lb

## 2019-04-25 DIAGNOSIS — G8929 Other chronic pain: Secondary | ICD-10-CM

## 2019-04-25 DIAGNOSIS — M1712 Unilateral primary osteoarthritis, left knee: Secondary | ICD-10-CM

## 2019-04-25 DIAGNOSIS — M25562 Pain in left knee: Secondary | ICD-10-CM | POA: Diagnosis not present

## 2019-04-25 NOTE — Telephone Encounter (Signed)
Left detailed message, for Mr. Rullan, giving instructions for using the Capsaicin Cream per Dr. Lorelei Pont.

## 2019-04-25 NOTE — Progress Notes (Signed)
Nicholas Zeiser T. Deandrea Vanpelt, MD Primary Care and [place] at [facility] [place], [address] Phone: [phone]  FAX: [place] - 64 y.o. male  MRN [id number]  Date of Birth: [birth year]  Visit Date: 04/25/2019  PCP: Owens Loffler, MD  Referred by: Owens Loffler, MD  Chief Complaint  Patient presents with  . Knee Pain    Left    This visit occurred during the SARS-CoV-2 public health emergency.  Safety protocols were in place, including screening questions prior to the visit, additional usage of staff PPE, and extensive cleaning of exam room while observing appropriate contact time as indicated for disinfecting solutions.   Subjective:   Nicholas Thompson is a 64 y.o. very pleasant male patient with Body mass index is 34.62 kg/m. who presents with the following:  L knee pain: for the last few weeks or month, and not that bad when he leaves home.  10/10 in the afternoon.    He is a nice gentleman, and he thinks he had arthroscopy on this knee, but is not entirely sure and he may have had it on his right knee.  This was approximately 30 years ago and he is not entirely sure of the procedure that he had other than arthroscopy.  He does have some severe pain and after work it is often 10 out of 10.  He is taking Tylenol, as well as some rare tramadol.  He does not take NSAIDs secondary to prior right nephrectomy and renal cell carcinoma.  He does have some buckling as well but does not have any mechanical locking up.  Will buckle. No locking up.   L knee inj  Past Medical History, Surgical History, Social History, Family History, Problem List, Medications, and Allergies have been reviewed and updated if relevant.   Review of Systems is noted in the HPI, as appropriate   Objective:   BP (!) 148/72   Pulse 64   Temp 98.1 F (36.7 C) (Temporal)   Ht 5' 11.5" (1.816 m)   Wt 251 lb 12 oz (114.2 kg)   SpO2 99%    BMI 34.62 kg/m    GEN: WDWN, NAD, Non-toxic, Alert & Oriented x 3 HEENT: Atraumatic, Normocephalic.  Ears and Nose: No external deformity. EXTR: No clubbing/cyanosis/edema NEURO: Normal gait.  PSYCH: Normally interactive. Conversant. Not depressed or anxious appearing.  Calm demeanor.   Knee:  L Gait: Normal heel toe pattern ROM: 0-120 Effusion: neg Echymosis or edema: none Patellar tendon NT Painful PLICA: neg Patellar grind: negative Medial and lateral patellar facet loading: negative medial and lateral joint lines: medial ttp Mcmurray's pos pain Flexion-pinch pos pain Varus and valgus stress: stable Lachman: neg Ant and Post drawer: neg Hip abduction, IR, ER: WNL Hip flexion str: 5/5 Hip abd: 5/5 Quad: 5/5 VMO atrophy:No Hamstring concentric and eccentric: 5/5   Radiology: No results found.  Assessment and Plan:     ICD-10-CM   1. Primary osteoarthritis of left knee  M17.12   2. Chronic pain of left knee  M25.562 DG Knee 4 Views W/Patella Left   G89.29    Osteoarthritis, predominantly in the medial compartment with moderate joint space loss.  He also has some osteophytosis and chondrocalcinosis.  Tricompartmental arthritis overall. Electronically Signed  By: Owens Loffler, MD On: 04/25/2019  8:00 AM EST   We talked about various options.  We talked about potentially doing a knee injection with corticosteroid.  At this point he wants to hold off.  Patient Instructions  OSTEOARTHRITIS:  For symptomatic relief:  Tylenol: 2 tablets up to 3-4 times a day  Topical Capzaicin Cream, as needed (wear glove to put on) - THIS IS EXCEPTIONALLY HOT  For flares, corticosteroid injections help. Hyaluronic Acid injections have good success, average relief is 6 months  Glucosamine and Chondroitin often helpful - will take about 3 months to see if you have an effect. If you do, great, keep them up, if none at that point, no need to take in the future.  Ice joints on  bad days, 20 min, 2-3 x / day REGULAR EXERCISE: swimming, Yoga, Tai Chi, bicycle (NON-IMPACT activity)   Weight loss will always take stress off of the joints and back    Follow-up: No follow-ups on file.  No orders of the defined types were placed in this encounter.  There are no discontinued medications. Orders Placed This Encounter  Procedures  . DG Knee 4 Views W/Patella Left    Signed,  Jovon Streetman T. Amaurie Wandel, MD   Outpatient Encounter Medications as of 04/25/2019  Medication Sig  . acetaminophen (TYLENOL) 500 MG tablet Take 1,000 mg by mouth every 6 (six) hours as needed.  . diclofenac sodium (VOLTAREN) 1 % GEL Apply 4 g topically 4 (four) times daily.  . enalapril (VASOTEC) 20 MG tablet Take 2 tablets by mouth once daily  . hydrochlorothiazide (HYDRODIURIL) 12.5 MG tablet Take 1 tablet (12.5 mg total) by mouth daily.  Marland Kitchen ketoconazole (NIZORAL) 2 % cream Apply 1 application topically daily as needed for irritation.  . Melatonin 3 MG TABS Take 12 mg by mouth at bedtime as needed (sleep).   Marland Kitchen omeprazole (PRILOSEC) 20 MG capsule Take 1 capsule by mouth once daily  . traMADol (ULTRAM) 50 MG tablet Take 1 tablet (50 mg total) by mouth every 6 (six) hours as needed.   No facility-administered encounter medications on file as of 04/25/2019.

## 2019-04-25 NOTE — Patient Instructions (Signed)
OSTEOARTHRITIS:  For symptomatic relief:  Tylenol: 2 tablets up to 3-4 times a day  Topical Capzaicin Cream, as needed (wear glove to put on) - THIS IS EXCEPTIONALLY HOT  For flares, corticosteroid injections help. Hyaluronic Acid injections have good success, average relief is 6 months  Glucosamine and Chondroitin often helpful - will take about 3 months to see if you have an effect. If you do, great, keep them up, if none at that point, no need to take in the future.  Ice joints on bad days, 20 min, 2-3 x / day REGULAR EXERCISE: swimming, Yoga, Tai Chi, bicycle (NON-IMPACT activity)   Weight loss will always take stress off of the joints and back

## 2019-04-25 NOTE — Telephone Encounter (Signed)
Patient called back stating that he saw Dr Lorelei Pont today and purchased Topical Capzaicin Cream as recommended. He wanted to know what are the directions to applying this? There were not directions on the package of the cream or the AVS from today.

## 2019-04-25 NOTE — Telephone Encounter (Signed)
  Topical Capzaicin Cream, as needed (wear glove to put on) - THIS IS EXCEPTIONALLY HOT  He can use this as much as he would like without limitation  4 times a day is probably reasonable, but he can use as much as he likes

## 2019-06-25 ENCOUNTER — Ambulatory Visit: Payer: BC Managed Care – PPO

## 2019-07-13 ENCOUNTER — Ambulatory Visit: Payer: BC Managed Care – PPO | Admitting: Family Medicine

## 2019-08-05 ENCOUNTER — Other Ambulatory Visit: Payer: Self-pay | Admitting: Family Medicine

## 2019-09-06 ENCOUNTER — Encounter: Payer: Self-pay | Admitting: Family Medicine

## 2019-09-08 NOTE — Progress Notes (Signed)
Nicholas Thompson T. Iisha Soyars, MD, [place] at [facility] [place], [address]  Phone: [phone]   FAX: [place] - 64 y.o. male   MRN [id number]   Date of Birth: 12-Nov-1955  Date: 09/10/2019   PCP: Owens Loffler, MD   Referral: Owens Loffler, MD  Chief Complaint  Patient presents with   Knee Pain    Left    This visit occurred during the SARS-CoV-2 public health emergency.  Safety protocols were in place, including screening questions prior to the visit, additional usage of staff PPE, and extensive cleaning of exam room while observing appropriate contact time as indicated for disinfecting solutions.   Subjective:   AARNAV Thompson is a 64 y.o. very pleasant male patient with Body mass index is 34.11 kg/m. who presents with the following:  .  He is here to follow-up regarding his left knee.  He is really limited a lot by pain.  I did see him approximately 4 months ago.  At that point he wanted to work on this as conservatively as possible, so I gave him various treatments and alternative medicines that he can try.  He is here today in follow-up, and he asked me through the computer system if I would inject his knee today.  Basically, his knee is really not doing all that well.  He is having pain much of the time, and he is having to wear two different braces at the same time to prevent any kind of mechanical symptoms and buckling.  08/05/2019 Last OV with Owens Loffler, MD  L knee pain: for the last few weeks or month, and not that bad when he leaves home.  10/10 in the afternoon.     He is a nice gentleman, and he thinks he had arthroscopy on this knee, but is not entirely sure and he may have had it on his right knee.  This was approximately 30 years ago and he is not entirely sure of the procedure that he had other than arthroscopy.   He does have some severe pain and  after work it is often 10 out of 10.  He is taking Tylenol, as well as some rare tramadol.  He does not take NSAIDs secondary to prior right nephrectomy and renal cell carcinoma.  He does have some buckling as well but does not have any mechanical locking up.   Will buckle. No locking up.    Review of Systems is noted in the HPI, as appropriate   Objective:   BP (!) 160/82    Pulse 68    Temp 98.6 F (37 C) (Temporal)    Ht 5' 11.5" (1.816 m)    Wt 248 lb (112.5 kg)    SpO2 98%    BMI 34.11 kg/m    GEN: No acute distress; alert,appropriate. PULM: Breathing comfortably in no respiratory distress PSYCH: Normally interactive.    Left knee.  Lacks 2 degrees of extension.  Flexion to 100 degrees.  Stable to varus and valgus stress and ACL and PCL are intact.  Medial joint line tenderness.  Minimal pain with loading the patella.  Moderate lateral joint line tenderness.  Flexion pinch and McMurray's cause pain  Radiology: DG Knee 4 Views W/Patella Left  Result Date: 04/25/2019 CLINICAL DATA:  Chronic left knee pain EXAM: LEFT KNEE - COMPLETE 4+ VIEW COMPARISON:  None. FINDINGS: Frontal weight-bearing views  of both knees are obtained as well as oblique, lateral, and sunrise views of the left knee. There is medial and lateral compartment osteoarthritis within the right knee, with more severe joint space narrowing medially. Chondrocalcinosis is noted. On the left, there is 3 compartment osteoarthritis greatest in the medial compartment where it is moderate to severe. There is severe joint space loss, with pronounced marginal osteophyte formation. Chondrocalcinosis is seen within the medial and lateral compartments. There is slight lateral subluxation of the patella within the trochlear groove. No joint effusion. IMPRESSION: 1. Three compartmental left knee osteoarthritis greatest in the medial compartment. 2. Bicompartmental osteoarthritis within the visualized right knee. Electronically Signed   By:  Nicholas Thompson M.D.   On: 04/25/2019 10:31   Assessment and Plan:     ICD-10-CM   1. Primary osteoarthritis of left knee  M17.12    Moderate to severe osteoarthritis of the left knee.  Previously on last visit I reviewed multiple conservative measures.  Corticosteroid injection today.  If none of this helps, then viscosupplementation would be a reasonable next step.  Ultimately, I do not think anything else short of a total knee arthroplasty would provide him long-lasting relief.  At this point he is not ready to consider surgery.  Aspiration/Injection Procedure Note SHAHAB POLHAMUS [birth year] Date of procedure: 09/10/2019  Procedure: Large Joint Aspiration / Injection of Knee, L Indications: Pain  Procedure Details Patient verbally consented to procedure. Risks (including potential rare risk of infection), benefits, and alternatives explained. Sterilely prepped with Chloraprep. Ethyl cholride used for anesthesia. 8 cc Lidocaine 1% mixed with 2 mL Depo-Medrol 40 mg injected using the anteromedial approach without difficulty. No complications with procedure and tolerated well. Patient had decreased pain post-injection. Medication: 2 mL of Depo-Medrol 40 mg, equaling Depo-Medrol 80 mg total   Follow-up: No follow-ups on file.  No orders of the defined types were placed in this encounter.  There are no discontinued medications. No orders of the defined types were placed in this encounter.   Signed,  Maud Deed. Ezzard Ditmer, MD   Outpatient Encounter Medications as of 09/10/2019  Medication Sig   acetaminophen (TYLENOL) 500 MG tablet Take 1,000 mg by mouth every 6 (six) hours as needed.   diclofenac sodium (VOLTAREN) 1 % GEL Apply 4 g topically 4 (four) times daily.   enalapril (VASOTEC) 20 MG tablet Take 2 tablets by mouth once daily   hydrochlorothiazide (HYDRODIURIL) 12.5 MG tablet Take 1 tablet by mouth once daily   ketoconazole (NIZORAL) 2 % cream Apply 1 application topically  daily as needed for irritation.   Melatonin 3 MG TABS Take 12 mg by mouth at bedtime as needed (sleep).    omeprazole (PRILOSEC) 20 MG capsule Take 1 capsule by mouth once daily   traMADol (ULTRAM) 50 MG tablet Take 1 tablet (50 mg total) by mouth every 6 (six) hours as needed.   No facility-administered encounter medications on file as of 09/10/2019.

## 2019-09-10 ENCOUNTER — Ambulatory Visit: Payer: BC Managed Care – PPO | Admitting: Family Medicine

## 2019-09-10 ENCOUNTER — Other Ambulatory Visit: Payer: Self-pay

## 2019-09-10 ENCOUNTER — Encounter: Payer: Self-pay | Admitting: Family Medicine

## 2019-09-10 VITALS — BP 160/82 | HR 68 | Temp 98.6°F | Ht 71.5 in | Wt 248.0 lb

## 2019-09-10 DIAGNOSIS — M1712 Unilateral primary osteoarthritis, left knee: Secondary | ICD-10-CM

## 2019-09-10 MED ORDER — METHYLPREDNISOLONE ACETATE 40 MG/ML IJ SUSP
80.0000 mg | Freq: Once | INTRAMUSCULAR | Status: AC
  Administered 2019-09-10: 80 mg via INTRA_ARTICULAR

## 2019-09-10 NOTE — Addendum Note (Signed)
Addended by: Carter Kitten on: 09/10/2019 09:34 AM   Modules accepted: Orders

## 2019-09-16 ENCOUNTER — Encounter: Payer: Self-pay | Admitting: Family Medicine

## 2019-10-07 ENCOUNTER — Other Ambulatory Visit: Payer: Self-pay | Admitting: Family Medicine

## 2019-10-28 ENCOUNTER — Other Ambulatory Visit: Payer: Self-pay | Admitting: Family Medicine

## 2019-10-30 ENCOUNTER — Other Ambulatory Visit: Payer: Self-pay

## 2019-10-30 ENCOUNTER — Ambulatory Visit: Payer: BC Managed Care – PPO | Admitting: Family Medicine

## 2019-10-30 ENCOUNTER — Encounter: Payer: Self-pay | Admitting: Family Medicine

## 2019-10-30 DIAGNOSIS — L255 Unspecified contact dermatitis due to plants, except food: Secondary | ICD-10-CM

## 2019-10-30 MED ORDER — PREDNISONE 10 MG PO TABS: ORAL_TABLET | ORAL | 0 refills | Status: DC

## 2019-10-30 NOTE — Patient Instructions (Signed)
Take prednisone as directed  Zyrtec over the counter (store brand) 10 mg daily   You may feel hyper and hungry   Stay cool  Ice helps   Wash anything that could have the plant oil on it

## 2019-10-30 NOTE — Progress Notes (Signed)
Subjective:    Patient ID: Nicholas Thompson, male    DOB: [birth year], 64 y.o.   MRN: [id number]  This visit occurred during the SARS-CoV-2 public health emergency.  Safety protocols were in place, including screening questions prior to the visit, additional usage of staff PPE, and extensive cleaning of exam room while observing appropriate contact time as indicated for disinfecting solutions.    HPI 64 yo pt of Dr Lorelei Pont presents with rash from poison ivy exposure  Wt Readings from Last 3 Encounters:  10/30/19 244 lb 9 oz (110.9 kg)  09/10/19 248 lb (112.5 kg)  04/25/19 251 lb 12 oz (114.2 kg)   33.63 kg/m  He may have been exposed to a vine he is allergic to (? [employer])  [place] he was exposed  He was in it last Thursday  Wearing shorts- was pulling the vine off with a tool   Noticed rash yesterday  Very itchy-miserable - was up last night  Primarily on feet  Little blisters   Used clobestasol and it did not help that much  Has not taken an antihistamines   Cannot work due to severe itching on feet in shoes  Stayed out today   Patient Active Problem List   Diagnosis Date Noted  . Thoracic aortic aneurysm without rupture ([place]) 03/20/2018  . Status post total hip replacement, left 08/30/2017  . Renal cell carcinoma of right kidney ([place]) 07/30/2015  . S/p RIGHT nephrectomy 07/30/2015  . Plant dermatitis 11/07/2012  . COLONIC POLYPS, HX OF 08/07/2008  . HYPERLIPIDEMIA 05/18/2007  . HTN (hypertension) 05/18/2007  . HYPERGLYCEMIA 05/18/2007  . CARCINOMA, BASAL CELL, HX OF 05/18/2007   Past Medical History:  Diagnosis Date  . Arthritis   . Basal cell carcinoma    bASAL CELL  . Chronic kidney disease   . GERD (gastroesophageal reflux disease)   . History of colonic polyps   . History of kidney stones   . Hyperlipidemia   . Nephrolithiasis   . Other and unspecified hyperlipidemia   . PONV (postoperative nausea and vomiting)   . Renal cell carcinoma of right  kidney ([place]) 07/30/2015   Nephrectomy  . S/p nephrectomy 07/30/2015  . Unspecified essential hypertension    Past Surgical History:  Procedure Laterality Date  . arthroscopy of knee Right 1989  . CYSTOSCOPY WITH STENT PLACEMENT Left 06/11/2015   Procedure: CYSTOSCOPY WITH STENT PLACEMENT/ bilateral retrograde pyelogram;  Surgeon: [name], MD;  Location: [facility] ORS;  Service: Urology;  Laterality: Left;  . [place] 2001&2006   3 times  . LAPAROSCOPIC NEPHRECTOMY, HAND ASSISTED Right 07/09/2015   Procedure: HAND ASSISTED LAPAROSCOPIC NEPHRECTOMY;  Surgeon: [name], MD;  Location: [facility] ORS;  Service: Urology;  Laterality: Right;  . MOHS SURGERY Left 04/2015   [place], [facility]  . SHOULDER ARTHROSCOPY W/ SUBACROMIAL DECOMPRESSION AND DISTAL CLAVICLE EXCISION Left 08-27-2007  . TOTAL HIP ARTHROPLASTY Left 08/30/2017   Procedure: TOTAL HIP ARTHROPLASTY ANTERIOR APPROACH;  Surgeon: Hessie Knows, MD;  Location: [facility] ORS;  Service: Orthopedics;  Laterality: Left;  . URETEROSCOPY WITH HOLMIUM LASER LITHOTRIPSY Bilateral 06/11/2015   Procedure: URETEROSCOPY WITH HOLMIUM LASER LITHOTRIPSY;  Surgeon: [name], MD;  Location: [facility] ORS;  Service: Urology;  Laterality: Bilateral;  . VASECTOMY     Social History   Tobacco Use  . Smoking status: Former Smoker    Types: Cigars  . Smokeless tobacco: Never Used  Vaping Use  . Vaping Use: Never  used  Substance Use Topics  . Alcohol use: Yes    Alcohol/week: 0.0 standard drinks    Comment: rarely  . Drug use: No   Family History  Problem Relation Age of Onset  . Cancer Mother        bladder  . Stroke Mother 19  . Diabetes Father   . Hyperlipidemia Father   . Coronary artery disease Father   . Cancer Paternal Uncle        brain  . Stroke Maternal Grandfather   . Cancer Paternal Grandfather        lung  . Prostate cancer Neg Hx   . Colon cancer Neg Hx    Allergies  Allergen Reactions  . Vicodin  [Hydrocodone-Acetaminophen] Nausea And Vomiting   Current Outpatient Medications on File Prior to Visit  Medication Sig Dispense Refill  . acetaminophen (TYLENOL) 500 MG tablet Take 1,000 mg by mouth every 6 (six) hours as needed.    . clobetasol cream (TEMOVATE) 5.70 % Apply 1 application topically daily as needed.    . diclofenac sodium (VOLTAREN) 1 % GEL Apply 4 g topically 4 (four) times daily. 100 g 0  . enalapril (VASOTEC) 20 MG tablet Take 2 tablets by mouth once daily 180 tablet 1  . hydrochlorothiazide (HYDRODIURIL) 12.5 MG tablet Take 1 tablet by mouth once daily 90 tablet 1  . ketoconazole (NIZORAL) 2 % cream Apply 1 application topically daily as needed for irritation. 30 g 1  . Melatonin 3 MG TABS Take 12 mg by mouth at bedtime as needed (sleep).     Marland Kitchen omeprazole (PRILOSEC) 20 MG capsule Take 1 capsule by mouth once daily 90 capsule 1  . traMADol (ULTRAM) 50 MG tablet Take 1 tablet (50 mg total) by mouth every 6 (six) hours as needed. 40 tablet 0   No current facility-administered medications on file prior to visit.    Review of Systems  Constitutional: Negative for activity change, appetite change, fatigue, fever and unexpected weight change.  HENT: Negative for congestion, rhinorrhea, sore throat and trouble swallowing.   Eyes: Negative for pain, redness, itching and visual disturbance.  Respiratory: Negative for cough, chest tightness, shortness of breath and wheezing.   Cardiovascular: Negative for chest pain and palpitations.  Gastrointestinal: Negative for abdominal pain, blood in stool, constipation, diarrhea and nausea.  Endocrine: Negative for cold intolerance, heat intolerance, polydipsia and polyuria.  Genitourinary: Negative for difficulty urinating, dysuria, frequency and urgency.  Musculoskeletal: Negative for arthralgias, joint swelling and myalgias.  Skin: Positive for rash. Negative for pallor.  Neurological: Negative for dizziness, tremors, weakness, numbness  and headaches.  Hematological: Negative for adenopathy. Does not bruise/bleed easily.  Psychiatric/Behavioral: Negative for decreased concentration and dysphoric mood. The patient is not nervous/anxious.        Objective:   Physical Exam Constitutional:      General: He is not in acute distress.    Appearance: Normal appearance. He is obese. He is not ill-appearing.  Eyes:     General:        Right eye: No discharge.        Left eye: No discharge.     Conjunctiva/sclera: Conjunctivae normal.     Pupils: Pupils are equal, round, and reactive to light.  Cardiovascular:     Rate and Rhythm: Normal rate and regular rhythm.  Pulmonary:     Effort: Pulmonary effort is normal.     Breath sounds: No wheezing.  Musculoskeletal:     Cervical  back: Normal range of motion and neck supple.     Right lower leg: No edema.     Left lower leg: No edema.  Lymphadenopathy:     Cervical: No cervical adenopathy.  Skin:    Comments: Small erythematous vesicular lesions in patches on both feet and ankles  One on R arm  No weeping or pustules  Few excoriations    Neurological:     Mental Status: He is alert.  Psychiatric:        Mood and Affect: Mood normal.           Assessment & Plan:   Problem List Items Addressed This Visit      Musculoskeletal and Integument   Plant dermatitis    On both feet- pt thinks contracted pulling vines from his yard  Not responding to clobetasol cream  Px prednisone taper 40 mg -disc poss side eff Also recommend zyrtec 10 mg daily for itch inst to keep cool  Keep areas clean with soap and water Wash items that may have touched plan oil Update if not starting to improve in a week or if worsening

## 2019-10-30 NOTE — Assessment & Plan Note (Signed)
On both feet- pt thinks contracted pulling vines from his yard  Not responding to clobetasol cream  Px prednisone taper 40 mg -disc poss side eff Also recommend zyrtec 10 mg daily for itch inst to keep cool  Keep areas clean with soap and water Wash items that may have touched plan oil Update if not starting to improve in a week or if worsening

## 2019-11-18 ENCOUNTER — Other Ambulatory Visit: Payer: Self-pay | Admitting: Family Medicine

## 2019-11-19 NOTE — Telephone Encounter (Signed)
Last office visit 10/30/2019 with Dr. Glori Bickers for Plant dermatitis.  Last refilled 07/08/20219 for 30 g with 1 refill.  No future appointments.

## 2019-11-30 ENCOUNTER — Encounter: Payer: Self-pay | Admitting: Family Medicine

## 2019-12-03 ENCOUNTER — Telehealth (INDEPENDENT_AMBULATORY_CARE_PROVIDER_SITE_OTHER): Payer: BC Managed Care – PPO | Admitting: Family Medicine

## 2019-12-03 ENCOUNTER — Other Ambulatory Visit: Payer: Self-pay

## 2019-12-03 ENCOUNTER — Encounter: Payer: Self-pay | Admitting: Family Medicine

## 2019-12-03 VITALS — Ht 71.5 in

## 2019-12-03 DIAGNOSIS — R0989 Other specified symptoms and signs involving the circulatory and respiratory systems: Secondary | ICD-10-CM

## 2019-12-03 DIAGNOSIS — R05 Cough: Secondary | ICD-10-CM

## 2019-12-03 DIAGNOSIS — R059 Cough, unspecified: Secondary | ICD-10-CM

## 2019-12-03 MED ORDER — PREDNISONE 20 MG PO TABS: ORAL_TABLET | ORAL | 0 refills | Status: DC

## 2019-12-03 NOTE — Progress Notes (Signed)
Nicholas Thompson T. Orvill Coulthard, MD Primary Care and [place] at [facility] [place], [address] Phone: [phone]  FAX: [place] - 64 y.o. male  MRN [id number]  Date of Birth: 09-18-1955  Visit Date: 12/03/2019  PCP: Owens Loffler, MD  Referred by: Owens Loffler, MD Chief Complaint  Patient presents with  . Nasal Congestion  . Cough    Mild   Virtual Visit via Video Note:  I connected with  Nicholas Thompson on 12/03/2019 11:20 AM EDT by a video enabled telemedicine application and verified that I am speaking with the correct person using two identifiers.   Location patient: home computer, tablet, or smartphone Location provider: work or home office Consent: Verbal consent directly obtained from [employer]. Persons participating in the virtual visit: patient, provider  I discussed the limitations of evaluation and management by telemedicine and the availability of in person appointments. The patient expressed understanding and agreed to proceed.  Interactive audio and video telecommunications were attempted between this provider and patient, however failed, due to patient having technical difficulties OR patient did not have access to video capability.  We continued and completed visit with audio only.    History of Present Illness:  He presents with predominantly a dry cough.  He also has some nasal congestion to the lesser degree.  He also has some sore throat.  This is been ongoing for about 2 weeks.  He does not feel overly bad, he had and he does not have a fever, chills, neurological changes, nausea, vomiting, diarrhea.  He has no rashes.  He is able to eat and drink normally and globally he feels only somewhat bad.  With his job he is exposed to many people, and he often does work at [employer].  He has not been quarantined during this time.  He presents with some rhinorhea.  Cough and nasal congestion.   2 weeks,  Mostly cough Zyrtec    Review of Systems as above: See pertinent positives and pertinent negatives per HPI No acute distress verbally   Observations/Objective/Exam:  An attempt was made to discern vital signs over the phone and per patient if applicable and possible.   General:    Alert, Oriented, appears well and in no acute distress  Pulmonary:     On inspection no signs of respiratory distress.  Psych / Neurological:     Pleasant and cooperative.  Assessment and Plan:    ICD-10-CM   1. Cough  R05   2. Runny nose  R09.89    Failure to improve with Zyrtec.  This certainly could be a in allergy flareup on its own versus virus.  Recommended COVID-19 testing and quarantining until the results of returned.  I discussed the assessment and treatment plan with the patient. The patient was provided an opportunity to ask questions and all were answered. The patient agreed with the plan and demonstrated an understanding of the instructions.   The patient was advised to call back or seek an in-person evaluation if the symptoms worsen or if the condition fails to improve as anticipated.  Follow-up: prn unless noted otherwise below No follow-ups on file.  Meds ordered this encounter  Medications  . predniSONE (DELTASONE) 20 MG tablet    Sig: 2 tabs daily for 3 days, then 1 tablet for 3 days    Dispense:  9 tablet    Refill:  0   No  orders of the defined types were placed in this encounter.   Signed,  Maud Deed. Naly Schwanz, MD

## 2019-12-04 ENCOUNTER — Encounter: Payer: Self-pay | Admitting: Family Medicine

## 2019-12-05 ENCOUNTER — Other Ambulatory Visit: Payer: Self-pay

## 2019-12-05 ENCOUNTER — Ambulatory Visit: Payer: BC Managed Care – PPO | Admitting: Family Medicine

## 2019-12-05 ENCOUNTER — Other Ambulatory Visit: Payer: BC Managed Care – PPO

## 2019-12-05 DIAGNOSIS — Z20822 Contact with and (suspected) exposure to covid-19: Secondary | ICD-10-CM | POA: Diagnosis not present

## 2019-12-06 LAB — NOVEL CORONAVIRUS, NAA: SARS-CoV-2, NAA: NOT DETECTED

## 2019-12-06 LAB — SARS-COV-2, NAA 2 DAY TAT

## 2019-12-12 ENCOUNTER — Encounter: Payer: Self-pay | Admitting: Family Medicine

## 2019-12-12 NOTE — Telephone Encounter (Signed)
9/23 appointment

## 2019-12-12 NOTE — Telephone Encounter (Signed)
Nicholas Thompson, can you help very get an appointment with me tomorrow.

## 2019-12-13 ENCOUNTER — Other Ambulatory Visit: Payer: Self-pay

## 2019-12-13 ENCOUNTER — Ambulatory Visit: Payer: BC Managed Care – PPO | Admitting: Family Medicine

## 2019-12-13 ENCOUNTER — Encounter: Payer: Self-pay | Admitting: Family Medicine

## 2019-12-13 VITALS — BP 132/84 | HR 89 | Temp 97.6°F | Ht 71.5 in | Wt 245.0 lb

## 2019-12-13 DIAGNOSIS — M1712 Unilateral primary osteoarthritis, left knee: Secondary | ICD-10-CM | POA: Diagnosis not present

## 2019-12-13 DIAGNOSIS — M11262 Other chondrocalcinosis, left knee: Secondary | ICD-10-CM | POA: Diagnosis not present

## 2019-12-13 MED ORDER — METHYLPREDNISOLONE ACETATE 40 MG/ML IJ SUSP
80.0000 mg | Freq: Once | INTRAMUSCULAR | Status: AC
  Administered 2019-12-13: 80 mg via INTRA_ARTICULAR

## 2019-12-13 MED ORDER — COLCHICINE 0.6 MG PO TABS: 0.6000 mg | ORAL_TABLET | Freq: Every day | ORAL | 1 refills | Status: DC | PRN

## 2019-12-13 NOTE — Progress Notes (Signed)
[name] T. Rion Catala, MD, [place] at [facility] [place], [address]  Phone: [phone]  FAX: [place] - 64 y.o. male  MRN [id number]  Date of Birth: 1955-07-11  Date: 12/13/2019  PCP: Owens Loffler, MD  Referral: Owens Loffler, MD  Chief Complaint  Patient presents with  . Left Knee Pain    MyChart discussion about possible injection in left knee.    This visit occurred during the SARS-CoV-2 public health emergency.  Safety protocols were in place, including screening questions prior to the visit, additional usage of staff PPE, and extensive cleaning of exam room while observing appropriate contact time as indicated for disinfecting solutions.   Subjective:   Nicholas Thompson is a 64 y.o. very pleasant male patient with Body mass index is 33.69 kg/m. who presents with the following:  F/u L knee pain: He has known very significant degenerative joint disease of the left knee.  I did give him a corticosteroid injection in June, and he had a very good response.  At some point if his symptoms persist and are functionally limiting, we discussed that total knee arthroplasty might be something he could consider.  He is knee does have full extension and flexion to 107 degrees.  Stable to varus and valgus stress.  All ligamentous structures are intact.  Quite significant medial joint line tenderness and to a much lesser extent the lateral joint line.  He does have patellar crepitus.  In addition to advanced knee osteoarthritis, the patient does have some chondrocalcinosis on his left knee film.     ICD-10-CM   1. Primary osteoarthritis of left knee  M17.12 methylPREDNISolone acetate (DEPO-MEDROL) injection 80 mg  2. Chondrocalcinosis of left knee  M11.262    Total encounter time: 10 minutes. This includes total time spent on the day of encounter.  Additional review of  films, discussion of potential other future problems and interventions with the patient.  He does have chondrocalcinosis visible on his plain film, and certainly this can occur with osteoarthritis on its own.  CPPD could be playing a role as well, so I am get a have him try some colchicine on his next knee flare.  Aspiration/Injection Procedure Note Nicholas Thompson Aug 07, 1955 Date of procedure: 12/13/2019  Procedure: Large Joint Aspiration / Injection of Knee, L Indications: Pain  Procedure Details Patient verbally consented to procedure. Risks (including potential rare risk of infection), benefits, and alternatives explained. Sterilely prepped with Chloraprep. Ethyl cholride used for anesthesia. 8 cc Lidocaine 1% mixed with 2 mL Depo-Medrol 40 mg injected using the anteromedial approach without difficulty. No complications with procedure and tolerated well. Patient had decreased pain post-injection. Medication: 2 mL of Depo-Medrol 40 mg, equaling Depo-Medrol 80 mg total  Follow-up: No follow-ups on file.  Meds ordered this encounter  Medications  . colchicine 0.6 MG tablet    Sig: Take 1 tablet (0.6 mg total) by mouth daily as needed (knee pain).    Dispense:  30 tablet    Refill:  1  . methylPREDNISolone acetate (DEPO-MEDROL) injection 80 mg   Signed,  Berlyn Saylor T. Brier Reid, MD   Outpatient Encounter Medications as of 12/13/2019  Medication Sig  . acetaminophen (TYLENOL) 500 MG tablet Take 1,000 mg by mouth every 6 (six) hours as needed.  . clobetasol cream (TEMOVATE) 7.58 % Apply 1 application topically daily as needed.  . diclofenac sodium (VOLTAREN)  1 % GEL Apply 4 g topically 4 (four) times daily.  . enalapril (VASOTEC) 20 MG tablet Take 2 tablets by mouth once daily  . hydrochlorothiazide (HYDRODIURIL) 12.5 MG tablet Take 1 tablet by mouth once daily  . ketoconazole (NIZORAL) 2 % cream APPLY 1 APPLICATION TOPICALLY DAILY AS NEEDED FOR IRRITATION  . Melatonin 3 MG TABS Take 12 mg by  mouth at bedtime as needed (sleep).   Marland Kitchen omeprazole (PRILOSEC) 20 MG capsule Take 1 capsule by mouth once daily  . traMADol (ULTRAM) 50 MG tablet Take 1 tablet (50 mg total) by mouth every 6 (six) hours as needed.  . colchicine 0.6 MG tablet Take 1 tablet (0.6 mg total) by mouth daily as needed (knee pain).  . [DISCONTINUED] predniSONE (DELTASONE) 20 MG tablet 2 tabs daily for 3 days, then 1 tablet for 3 days  . [EXPIRED] methylPREDNISolone acetate (DEPO-MEDROL) injection 80 mg    No facility-administered encounter medications on file as of 12/13/2019.

## 2019-12-14 ENCOUNTER — Encounter: Payer: Self-pay | Admitting: Family Medicine

## 2020-01-04 ENCOUNTER — Encounter: Payer: Self-pay | Admitting: Family Medicine

## 2020-01-06 NOTE — Telephone Encounter (Signed)
Can we help figure out his preferred product for viscosupplementation,hold on appt until we have this info  thanks

## 2020-01-07 ENCOUNTER — Encounter: Payer: Self-pay | Admitting: Family Medicine

## 2020-01-07 NOTE — Telephone Encounter (Signed)
Pt canceled wed appointment and will want to hear from office to schedule

## 2020-01-09 ENCOUNTER — Ambulatory Visit: Payer: BC Managed Care – PPO | Admitting: Family Medicine

## 2020-01-17 ENCOUNTER — Telehealth: Payer: Self-pay | Admitting: Family Medicine

## 2020-01-17 NOTE — Telephone Encounter (Signed)
Butch Penny,   Can you call him.  We have been working on getting his preferred medication approved.  This usually takes at least 2 weeks, often more.  It is impossible for me to know his exact medication.  Loosely, this class of medication is called viscosupplementation or hyaluronic acid.  During Covid-19 oubreak, and right now we are doing everything to manage.  But this causes at least twice as much work for everyone and everything.    He has very bad knee arthritis, and I am also happy to refer him to one of the total joint surgeons for their opinion at any point, too.

## 2020-01-17 NOTE — Telephone Encounter (Signed)
Mr. Shanahan notified as instructed by telephone.  Patient states understanding.

## 2020-01-17 NOTE — Telephone Encounter (Signed)
We are working on his visco authorization. Yes, it can, unfortunately, take a few weeks to process and even then there is no guarantee this will be approved.   I am very sorry the patient is having discomfort, not sure if anything else can be done in the meantime but we do continue to work on this.    Please provide update when you call him.

## 2020-01-17 NOTE — Telephone Encounter (Signed)
Working on [place] benefit processing, Douglass Rivers is assisting [place] with this process.

## 2020-01-17 NOTE — Telephone Encounter (Signed)
Pt called wanted to know about the knee injections he has been waiting for 2 weeks and he is in a lot of pain . And insurance called to see if PA is needed and what type of medicine will be used for the knee injections due to some may need a Pre-D or PA  Due to the billing . Please Advise   Call insurance [phone] (provider line)
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
## 2020-01-21 ENCOUNTER — Encounter: Payer: Self-pay | Admitting: Family Medicine

## 2020-01-21 ENCOUNTER — Ambulatory Visit (INDEPENDENT_AMBULATORY_CARE_PROVIDER_SITE_OTHER)
Admission: RE | Admit: 2020-01-21 | Discharge: 2020-01-21 | Disposition: A | Discharge status: Home / Self Care | Payer: BC Managed Care – PPO | Source: Ambulatory Visit | Attending: Family Medicine | Admitting: Family Medicine

## 2020-01-21 ENCOUNTER — Other Ambulatory Visit: Payer: Self-pay

## 2020-01-21 ENCOUNTER — Ambulatory Visit: Payer: BC Managed Care – PPO | Admitting: Family Medicine

## 2020-01-21 VITALS — BP 144/70 | HR 71 | Temp 98.5°F | Ht 71.5 in | Wt 244.2 lb

## 2020-01-21 DIAGNOSIS — S93411A Sprain of calcaneofibular ligament of right ankle, initial encounter: Secondary | ICD-10-CM | POA: Diagnosis not present

## 2020-01-21 DIAGNOSIS — S93491A Sprain of other ligament of right ankle, initial encounter: Secondary | ICD-10-CM | POA: Diagnosis not present

## 2020-01-21 DIAGNOSIS — M79671 Pain in right foot: Secondary | ICD-10-CM

## 2020-01-21 DIAGNOSIS — Z041 Encounter for examination and observation following transport accident: Secondary | ICD-10-CM | POA: Diagnosis not present

## 2020-01-22 NOTE — Telephone Encounter (Signed)
Durolane coverage benefits: Durolane is covered at 75% of the allowable. Deductible must be met before the cover applies. If OOP is met, coverage goes to 100%. No pre-cert required.   Monovisc coverage benefits: Once the deductible has been met, pt is responsible for a coinsurance. Once the OOP has been pet, pt is covered at 100%. Pre-cert is not required.

## 2020-01-23 NOTE — Telephone Encounter (Addendum)
Spoke with patient. I explained coverage, he has a 3000.00 deductible and cannot afford the injections at this time.   I am going to my chart him the medication names and code information so that he can contact his insurance company for a more thorough review.   Currently, patient cannot afford to move forward with the products and wants to speak with his insurance company directly.

## 2020-02-06 ENCOUNTER — Telehealth: Payer: Self-pay

## 2020-02-06 ENCOUNTER — Telehealth: Payer: Self-pay | Admitting: *Deleted

## 2020-02-06 ENCOUNTER — Other Ambulatory Visit: Payer: Self-pay | Admitting: Family Medicine

## 2020-02-06 DIAGNOSIS — M1712 Unilateral primary osteoarthritis, left knee: Secondary | ICD-10-CM

## 2020-02-06 MED ORDER — TRAMADOL HCL 50 MG PO TABS: 50.0000 mg | ORAL_TABLET | Freq: Four times a day (QID) | ORAL | 1 refills | Status: DC | PRN

## 2020-02-06 NOTE — Telephone Encounter (Signed)
Chronic knee pain  I am very familiar with [place] act.

## 2020-02-06 NOTE — Telephone Encounter (Signed)
Pt said he stands all the time at work and today had to leave work at lunch due to lt knee pain. Pt said he thinks the knee surgery is going to need to be sooner than later. Pt request ortho referral to Dr Hessie Knows to see what the next step is for possible surgery. Pt said he does not want the surgery but cannot stand the pain. Pt wants Dr Lorelei Pont and Butch Penny CMA to be aware of pts call.

## 2020-02-06 NOTE — Telephone Encounter (Signed)
done

## 2020-02-06 NOTE — Telephone Encounter (Signed)
Refill request Tramadol Last refill 03/28/19 #40 Last office visit 01/21/20 Upcoming appointment none scheduled

## 2020-02-06 NOTE — Telephone Encounter (Signed)
Mark notified by telephone that Nicholas Thompson is on Tramadol for Chronic Knee Pain.

## 2020-02-06 NOTE — Telephone Encounter (Signed)
Mark the pharmacist left a voicemail stating that they received a script today for Tramadol. Elta Guadeloupe stated that they need to make a notation if this is prescribed for a chronic condition. Elta Guadeloupe stated if this is for an acute problem he can dispense a 7 days supply. Mark requested a call back to confirm if this is chronic or acute Elta Guadeloupe stated that he sees that patient does get Tramadol occasionally. Pharmacist requested a call back to confirm condition.

## 2020-02-07 ENCOUNTER — Encounter: Payer: Self-pay | Admitting: Family Medicine

## 2020-02-27 ENCOUNTER — Encounter: Payer: Self-pay | Admitting: Family Medicine

## 2020-02-27 DIAGNOSIS — M1712 Unilateral primary osteoarthritis, left knee: Secondary | ICD-10-CM | POA: Diagnosis not present

## 2020-02-27 DIAGNOSIS — M25562 Pain in left knee: Secondary | ICD-10-CM | POA: Diagnosis not present

## 2020-02-28 ENCOUNTER — Other Ambulatory Visit: Payer: Self-pay | Admitting: Orthopedic Surgery

## 2020-02-29 ENCOUNTER — Encounter: Payer: Self-pay | Admitting: Family Medicine

## 2020-03-02 ENCOUNTER — Other Ambulatory Visit: Payer: Self-pay | Admitting: Family Medicine

## 2020-03-04 ENCOUNTER — Encounter
Admission: RE | Admit: 2020-03-04 | Discharge: 2020-03-04 | Disposition: A | Discharge status: Home / Self Care | Payer: BC Managed Care – PPO | Source: Ambulatory Visit | Attending: Orthopedic Surgery | Admitting: Orthopedic Surgery

## 2020-03-04 ENCOUNTER — Other Ambulatory Visit: Payer: Self-pay

## 2020-03-04 DIAGNOSIS — Z01818 Encounter for other preprocedural examination: Secondary | ICD-10-CM | POA: Diagnosis not present

## 2020-03-04 LAB — CBC WITH DIFFERENTIAL/PLATELET
Abs Immature Granulocytes: 0.02 10*3/uL (ref 0.00–0.07)
Basophils Absolute: 0 10*3/uL (ref 0.0–0.1)
Basophils Relative: 1 %
Eosinophils Absolute: 0.2 10*3/uL (ref 0.0–0.5)
Eosinophils Relative: 2 %
HCT: 41.9 % (ref 39.0–52.0)
Hemoglobin: 15 g/dL (ref 13.0–17.0)
Immature Granulocytes: 0 %
Lymphocytes Relative: 27 %
Lymphs Abs: 2.2 10*3/uL (ref 0.7–4.0)
MCH: 32.5 pg (ref 26.0–34.0)
MCHC: 35.8 g/dL (ref 30.0–36.0)
MCV: 90.7 fL (ref 80.0–100.0)
Monocytes Absolute: 0.7 10*3/uL (ref 0.1–1.0)
Monocytes Relative: 9 %
Neutro Abs: 5 10*3/uL (ref 1.7–7.7)
Neutrophils Relative %: 61 %
Platelets: 262 10*3/uL (ref 150–400)
RBC: 4.62 MIL/uL (ref 4.22–5.81)
RDW: 12 % (ref 11.5–15.5)
WBC: 8.1 10*3/uL (ref 4.0–10.5)
nRBC: 0 % (ref 0.0–0.2)

## 2020-03-04 LAB — COMPREHENSIVE METABOLIC PANEL
ALT: 21 U/L (ref 0–44)
AST: 21 U/L (ref 15–41)
Albumin: 4 g/dL (ref 3.5–5.0)
Alkaline Phosphatase: 64 U/L (ref 38–126)
Anion gap: 10 (ref 5–15)
BUN: 12 mg/dL (ref 8–23)
CO2: 27 mmol/L (ref 22–32)
Calcium: 9.4 mg/dL (ref 8.9–10.3)
Chloride: 102 mmol/L (ref 98–111)
Creatinine, Ser: 0.91 mg/dL (ref 0.61–1.24)
GFR, Estimated: >60 mL/min (ref >60)
Glucose, Bld: 110 mg/dL — ABNORMAL HIGH (ref 70–99)
Potassium: 3.8 mmol/L (ref 3.5–5.1)
Sodium: 139 mmol/L (ref 135–145)
Total Bilirubin: 0.8 mg/dL (ref 0.3–1.2)
Total Protein: 7 g/dL (ref 6.5–8.1)

## 2020-03-04 LAB — TYPE AND SCREEN
ABO/RH(D): O POS
Antibody Screen: NEGATIVE

## 2020-03-04 LAB — SURGICAL PCR SCREEN
MRSA, PCR: NEGATIVE
Staphylococcus aureus: NEGATIVE

## 2020-03-04 LAB — URINALYSIS, ROUTINE W REFLEX MICROSCOPIC
Bilirubin Urine: NEGATIVE
Glucose, UA: NEGATIVE mg/dL
Hgb urine dipstick: NEGATIVE
Ketones, ur: NEGATIVE mg/dL
Leukocytes,Ua: NEGATIVE
Nitrite: NEGATIVE
Protein, ur: NEGATIVE mg/dL
Specific Gravity, Urine: 1.006 (ref 1.005–1.030)
pH: 7 (ref 5.0–8.0)

## 2020-03-04 NOTE — Patient Instructions (Addendum)
Your procedure is scheduled on: 03/13/20- Thursday Report to the Registration Desk on the 1st floor of the [facility]. To find out your arrival time, please call [phone] between 1PM - 3PM on: 03/12/20- Wednesday  REMEMBER: Instructions that are not followed completely may result in serious medical risk, up to and including death; or upon the discretion of your surgeon and anesthesiologist your surgery may need to be rescheduled.  Do not eat food after midnight the night before surgery.  No gum chewing, lozengers or hard candies.  You may however, drink CLEAR liquids up to 2 hours before you are scheduled to arrive for your surgery. Do not drink anything within 2 hours of your scheduled arrival time.  Clear liquids include: - water  - apple juice without pulp - gatorade (not RED, PURPLE, OR BLUE) - black coffee or tea (Do NOT add milk or creamers to the coffee or tea) Do NOT drink anything that is not on this list.  Type 1 and Type 2 diabetics should only drink water.  In addition, your doctor has ordered for you to drink the provided  Ensure Pre-Surgery Clear Carbohydrate Drink  Drinking this carbohydrate drink up to two hours before surgery helps to reduce insulin resistance and improve patient outcomes. Please complete drinking 2 hours prior to scheduled arrival time.  TAKE THESE MEDICATIONS THE MORNING OF SURGERY WITH A SIP OF WATER: - acetaminophen (TYLENOL) 650 MG CR tablet - omeprazole (PRILOSEC) 20 MG capsule, take one the night before and one on the morning of surgery - helps to prevent nausea after surgery.  One week prior to surgery: Stop Anti-inflammatories (NSAIDS) such as Advil, Aleve, Ibuprofen, Motrin, Naproxen, Naprosyn and Aspirin based products such as Excedrin, Goodys Powder, BC Powder.  Stop ANY OVER THE COUNTER supplements until after surgery. (However, you may continue taking Vitamin D, Vitamin B, and multivitamin up until the day before surgery.)  No  Alcohol for 24 hours before or after surgery.  No Smoking including e-cigarettes for 24 hours prior to surgery.  No chewable tobacco products for at least 6 hours prior to surgery.  No nicotine patches on the day of surgery.  Do not use any "recreational" drugs for at least a week prior to your surgery.  Please be advised that the combination of cocaine and anesthesia may have negative outcomes, up to and including death. If you test positive for cocaine, your surgery will be cancelled.  On the morning of surgery brush your teeth with toothpaste and water, you may rinse your mouth with mouthwash if you wish. Do not swallow any toothpaste or mouthwash.  Do not wear jewelry, make-up, hairpins, clips or nail polish.  Do not wear lotions, powders, or perfumes.   Do not shave body from the neck down 48 hours prior to surgery just in case you cut yourself which could leave a site for infection.  Also, freshly shaved skin may become irritated if using the CHG soap.  Contact lenses, hearing aids and dentures may not be worn into surgery.  Do not bring valuables to the hospital. [facility] is not responsible for any missing/lost belongings or valuables.   Use CHG Soap or wipes as directed on instruction sheet.  Notify your doctor if there is any change in your medical condition (cold, fever, infection).  Wear comfortable clothing (specific to your surgery type) to the hospital.  Plan for stool softeners for home use; pain medications have a tendency to cause constipation. You can also  help prevent constipation by eating foods high in fiber such as fruits and vegetables and drinking plenty of fluids as your diet allows.  After surgery, you can help prevent lung complications by doing breathing exercises.  Take deep breaths and cough every 1-2 hours. Your doctor may order a device called an Incentive Spirometer to help you take deep breaths. When coughing or sneezing, hold a pillow firmly  against your incision with both hands. This is called splinting. Doing this helps protect your incision. It also decreases belly discomfort.  If you are being admitted to the hospital overnight, leave your suitcase in the car. After surgery it may be brought to your room.  If you are being discharged the day of surgery, you will not be allowed to drive home. You will need a responsible adult (18 years or older) to drive you home and stay with you that night.   If you are taking public transportation, you will need to have a responsible adult (18 years or older) with you. Please confirm with your physician that it is acceptable to use public transportation.   Please call the [facility] at [phone] if you have any questions about these instructions.  Visitation Policy:  Patients undergoing a surgery or procedure may have one family member or support person with them as long as that person is not COVID-19 positive or experiencing its symptoms.  That person may remain in the waiting area during the procedure.  Inpatient Visitation Update:   In an effort to ensure the safety of our team members and our patients, we are implementing a change to our visitation policy:  Effective Monday, Aug. 9, at 7 a.m., inpatients will be allowed one support person.  o The support person may change daily.  o The support person must pass our screening, gel in and out, and wear a mask at all times, including in the patients room.  o Patients must also wear a mask when staff or their support person are in the room.  o Masking is required regardless of vaccination status.  Systemwide, no visitors 17 or younger.

## 2020-03-11 ENCOUNTER — Other Ambulatory Visit: Payer: Self-pay

## 2020-03-11 ENCOUNTER — Other Ambulatory Visit
Admission: RE | Admit: 2020-03-11 | Discharge: 2020-03-11 | Disposition: A | Discharge status: Home / Self Care | Payer: BC Managed Care – PPO | Source: Ambulatory Visit | Attending: Orthopedic Surgery | Admitting: Orthopedic Surgery

## 2020-03-11 DIAGNOSIS — Z20822 Contact with and (suspected) exposure to covid-19: Secondary | ICD-10-CM | POA: Diagnosis not present

## 2020-03-11 DIAGNOSIS — Z01812 Encounter for preprocedural laboratory examination: Secondary | ICD-10-CM | POA: Insufficient documentation

## 2020-03-11 LAB — APTT: aPTT: 30 seconds (ref 24–36)

## 2020-03-11 LAB — PROTIME-INR
INR: 0.9 (ref 0.8–1.2)
Prothrombin Time: 12 seconds (ref 11.4–15.2)

## 2020-03-11 LAB — SARS CORONAVIRUS 2 (TAT 6-24 HRS): SARS Coronavirus 2: NEGATIVE

## 2020-03-13 ENCOUNTER — Ambulatory Visit: Payer: BC Managed Care – PPO | Admitting: Urgent Care

## 2020-03-13 ENCOUNTER — Encounter: Payer: Self-pay | Admitting: Orthopedic Surgery

## 2020-03-13 ENCOUNTER — Observation Stay: Payer: BC Managed Care – PPO

## 2020-03-13 ENCOUNTER — Other Ambulatory Visit: Payer: Self-pay

## 2020-03-13 ENCOUNTER — Encounter
Admission: RE | Disposition: A | Discharge status: Home / Self Care | Payer: Self-pay | Source: Home / Self Care | Attending: Orthopedic Surgery

## 2020-03-13 ENCOUNTER — Observation Stay
Admission: RE | Admit: 2020-03-13 | Discharge: 2020-03-17 | Disposition: A | Discharge status: Home / Self Care | Payer: BC Managed Care – PPO | Attending: Orthopedic Surgery | Admitting: Orthopedic Surgery

## 2020-03-13 DIAGNOSIS — M25562 Pain in left knee: Secondary | ICD-10-CM | POA: Diagnosis not present

## 2020-03-13 DIAGNOSIS — M1712 Unilateral primary osteoarthritis, left knee: Secondary | ICD-10-CM | POA: Diagnosis not present

## 2020-03-13 DIAGNOSIS — Z85528 Personal history of other malignant neoplasm of kidney: Secondary | ICD-10-CM | POA: Insufficient documentation

## 2020-03-13 DIAGNOSIS — N189 Chronic kidney disease, unspecified: Secondary | ICD-10-CM | POA: Diagnosis not present

## 2020-03-13 DIAGNOSIS — Z471 Aftercare following joint replacement surgery: Secondary | ICD-10-CM | POA: Diagnosis not present

## 2020-03-13 DIAGNOSIS — M76892 Other specified enthesopathies of left lower limb, excluding foot: Secondary | ICD-10-CM | POA: Diagnosis not present

## 2020-03-13 DIAGNOSIS — I129 Hypertensive chronic kidney disease with stage 1 through stage 4 chronic kidney disease, or unspecified chronic kidney disease: Secondary | ICD-10-CM | POA: Insufficient documentation

## 2020-03-13 DIAGNOSIS — Z96652 Presence of left artificial knee joint: Secondary | ICD-10-CM

## 2020-03-13 DIAGNOSIS — Z905 Acquired absence of kidney: Secondary | ICD-10-CM | POA: Insufficient documentation

## 2020-03-13 DIAGNOSIS — G8918 Other acute postprocedural pain: Secondary | ICD-10-CM

## 2020-03-13 DIAGNOSIS — Z96642 Presence of left artificial hip joint: Secondary | ICD-10-CM | POA: Insufficient documentation

## 2020-03-13 DIAGNOSIS — Z9889 Other specified postprocedural states: Secondary | ICD-10-CM | POA: Diagnosis not present

## 2020-03-13 HISTORY — PX: TOTAL KNEE ARTHROPLASTY: SHX125

## 2020-03-13 LAB — CBC
HCT: 42.4 % (ref 39.0–52.0)
Hemoglobin: 14.6 g/dL (ref 13.0–17.0)
MCH: 32.1 pg (ref 26.0–34.0)
MCHC: 34.4 g/dL (ref 30.0–36.0)
MCV: 93.2 fL (ref 80.0–100.0)
Platelets: 287 10*3/uL (ref 150–400)
RBC: 4.55 MIL/uL (ref 4.22–5.81)
RDW: 12 % (ref 11.5–15.5)
WBC: 7.2 10*3/uL (ref 4.0–10.5)
nRBC: 0 % (ref 0.0–0.2)

## 2020-03-13 LAB — POCT I-STAT, CHEM 8
BUN: 17 mg/dL (ref 8–23)
Calcium, Ion: 1.15 mmol/L (ref 1.15–1.40)
Chloride: 103 mmol/L (ref 98–111)
Creatinine, Ser: 1 mg/dL (ref 0.61–1.24)
Glucose, Bld: 97 mg/dL (ref 70–99)
HCT: 43 % (ref 39.0–52.0)
Hemoglobin: 14.6 g/dL (ref 13.0–17.0)
Potassium: 3.6 mmol/L (ref 3.5–5.1)
Sodium: 140 mmol/L (ref 135–145)
TCO2: 25 mmol/L (ref 22–32)

## 2020-03-13 LAB — CREATININE, SERUM
Creatinine, Ser: 0.97 mg/dL (ref 0.61–1.24)
GFR, Estimated: >60 mL/min (ref >60)

## 2020-03-13 LAB — POTASSIUM: Potassium: 3.8 mmol/L (ref 3.5–5.1)

## 2020-03-13 SURGERY — ARTHROPLASTY, KNEE, TOTAL
Anesthesia: Spinal | Site: Knee | Laterality: Left

## 2020-03-13 MED ORDER — HYDROCHLOROTHIAZIDE 25 MG PO TABS
12.5000 mg | ORAL_TABLET | Freq: Every day | ORAL | Status: DC
Start: 2020-03-13 — End: 2020-03-17
  Administered 2020-03-13 – 2020-03-17 (×5): 12.5 mg via ORAL
  Filled 2020-03-13 (×5): qty 1

## 2020-03-13 MED ORDER — SODIUM CHLORIDE 0.9 % IV SOLN
INTRAVENOUS | Status: DC | PRN
  Administered 2020-03-13: 20 ug/min via INTRAVENOUS

## 2020-03-13 MED ORDER — MORPHINE SULFATE (PF) 10 MG/ML IV SOLN
INTRAVENOUS | Status: DC | PRN
  Administered 2020-03-13: 10 mg

## 2020-03-13 MED ORDER — ONDANSETRON HCL 4 MG/2ML IJ SOLN: 4.0000 mg | Freq: Once | INTRAMUSCULAR | Status: DC | PRN

## 2020-03-13 MED ORDER — GLYCOPYRROLATE 0.2 MG/ML IJ SOLN
INTRAMUSCULAR | Status: DC | PRN
  Administered 2020-03-13: .2 mg via INTRAVENOUS

## 2020-03-13 MED ORDER — SCOPOLAMINE 1 MG/3DAYS TD PT72
1.0000 | MEDICATED_PATCH | TRANSDERMAL | Status: DC
  Administered 2020-03-13 – 2020-03-16 (×2): 1.5 mg via TRANSDERMAL
  Filled 2020-03-13 (×2): qty 1

## 2020-03-13 MED ORDER — PHENYLEPHRINE HCL (PRESSORS) 10 MG/ML IV SOLN
INTRAVENOUS | Status: DC | PRN
  Administered 2020-03-13 (×2): 100 ug via INTRAVENOUS
  Administered 2020-03-13: 50 ug via INTRAVENOUS
  Administered 2020-03-13: 100 ug via INTRAVENOUS

## 2020-03-13 MED ORDER — CEFAZOLIN SODIUM-DEXTROSE 2-4 GM/100ML-% IV SOLN
2.0000 g | Freq: Four times a day (QID) | INTRAVENOUS | Status: AC
  Administered 2020-03-13 (×2): 2 g via INTRAVENOUS
  Filled 2020-03-13 (×2): qty 100

## 2020-03-13 MED ORDER — METHOCARBAMOL 1000 MG/10ML IJ SOLN
500.0000 mg | Freq: Four times a day (QID) | INTRAVENOUS | Status: DC | PRN
  Filled 2020-03-13: qty 5

## 2020-03-13 MED ORDER — ZOLPIDEM TARTRATE 5 MG PO TABS: 5.0000 mg | ORAL_TABLET | Freq: Every evening | ORAL | Status: DC | PRN

## 2020-03-13 MED ORDER — ENOXAPARIN SODIUM 30 MG/0.3ML ~~LOC~~ SOLN
30.0000 mg | Freq: Two times a day (BID) | SUBCUTANEOUS | Status: DC
  Administered 2020-03-14 – 2020-03-17 (×7): 30 mg via SUBCUTANEOUS
  Filled 2020-03-13 (×7): qty 0.3

## 2020-03-13 MED ORDER — PROPOFOL 10 MG/ML IV BOLUS
INTRAVENOUS | Status: AC
  Filled 2020-03-13: qty 20

## 2020-03-13 MED ORDER — METOCLOPRAMIDE HCL 5 MG/ML IJ SOLN
5.0000 mg | Freq: Three times a day (TID) | INTRAMUSCULAR | Status: DC | PRN
  Administered 2020-03-13: 10 mg via INTRAVENOUS
  Filled 2020-03-13: qty 2

## 2020-03-13 MED ORDER — HYDROMORPHONE HCL 1 MG/ML IJ SOLN: 0.5000 mg | INTRAMUSCULAR | Status: DC | PRN

## 2020-03-13 MED ORDER — ACETAMINOPHEN 10 MG/ML IV SOLN
INTRAVENOUS | Status: DC | PRN
  Administered 2020-03-13: 1000 mg via INTRAVENOUS

## 2020-03-13 MED ORDER — DEXTROSE 5 % IV SOLN
2.0000 g | INTRAVENOUS | Status: DC
  Filled 2020-03-13: qty 20

## 2020-03-13 MED ORDER — CHLORHEXIDINE GLUCONATE 0.12 % MT SOLN
OROMUCOSAL | Status: AC
  Administered 2020-03-13: 15 mL via OROMUCOSAL
  Filled 2020-03-13: qty 15

## 2020-03-13 MED ORDER — SURGIPHOR WOUND IRRIGATION SYSTEM - OPTIME
TOPICAL | Status: DC | PRN
  Administered 2020-03-13: 450 mL via TOPICAL

## 2020-03-13 MED ORDER — CHLORHEXIDINE GLUCONATE 0.12 % MT SOLN: 15.0000 mL | Freq: Once | OROMUCOSAL | Status: AC

## 2020-03-13 MED ORDER — ACETAMINOPHEN 10 MG/ML IV SOLN
INTRAVENOUS | Status: AC
  Filled 2020-03-13: qty 100

## 2020-03-13 MED ORDER — LIDOCAINE HCL (CARDIAC) PF 100 MG/5ML IV SOSY
PREFILLED_SYRINGE | INTRAVENOUS | Status: DC | PRN
  Administered 2020-03-13: 100 mg via INTRAVENOUS

## 2020-03-13 MED ORDER — SODIUM CHLORIDE 0.9 % IV SOLN
INTRAVENOUS | Status: DC | PRN
  Administered 2020-03-13: 60 mL

## 2020-03-13 MED ORDER — DEXMEDETOMIDINE (PRECEDEX) IN NS 20 MCG/5ML (4 MCG/ML) IV SYRINGE
PREFILLED_SYRINGE | INTRAVENOUS | Status: AC
  Filled 2020-03-13: qty 5

## 2020-03-13 MED ORDER — FENTANYL CITRATE (PF) 100 MCG/2ML IJ SOLN
INTRAMUSCULAR | Status: AC
  Administered 2020-03-13: 25 ug via INTRAVENOUS
  Filled 2020-03-13: qty 2

## 2020-03-13 MED ORDER — MIDAZOLAM HCL 2 MG/2ML IJ SOLN
INTRAMUSCULAR | Status: AC
  Filled 2020-03-13: qty 2

## 2020-03-13 MED ORDER — MENTHOL 3 MG MT LOZG
1.0000 | LOZENGE | OROMUCOSAL | Status: DC | PRN
  Filled 2020-03-13: qty 9

## 2020-03-13 MED ORDER — ONDANSETRON HCL 4 MG PO TABS: 4.0000 mg | ORAL_TABLET | Freq: Four times a day (QID) | ORAL | Status: DC | PRN

## 2020-03-13 MED ORDER — MAGNESIUM HYDROXIDE 400 MG/5ML PO SUSP
30.0000 mL | Freq: Every day | ORAL | Status: DC | PRN
  Administered 2020-03-14 – 2020-03-17 (×3): 30 mL via ORAL
  Filled 2020-03-13 (×3): qty 30

## 2020-03-13 MED ORDER — LACTATED RINGERS IV SOLN: INTRAVENOUS | Status: DC

## 2020-03-13 MED ORDER — ALUM & MAG HYDROXIDE-SIMETH 200-200-20 MG/5ML PO SUSP
30.0000 mL | ORAL | Status: DC | PRN
  Administered 2020-03-14: 30 mL via ORAL
  Filled 2020-03-13: qty 30

## 2020-03-13 MED ORDER — KETAMINE HCL 50 MG/ML IJ SOLN
INTRAMUSCULAR | Status: DC | PRN
  Administered 2020-03-13: 20 mg via INTRAMUSCULAR

## 2020-03-13 MED ORDER — BUPIVACAINE-EPINEPHRINE (PF) 0.25% -1:200000 IJ SOLN
INTRAMUSCULAR | Status: DC | PRN
  Administered 2020-03-13: 30 mL

## 2020-03-13 MED ORDER — OXYCODONE HCL 5 MG PO TABS
10.0000 mg | ORAL_TABLET | ORAL | Status: DC | PRN
  Administered 2020-03-15: 10 mg via ORAL

## 2020-03-13 MED ORDER — SODIUM CHLORIDE 0.9 % IV SOLN: INTRAVENOUS | Status: DC

## 2020-03-13 MED ORDER — ACETAMINOPHEN 500 MG PO TABS
1000.0000 mg | ORAL_TABLET | Freq: Four times a day (QID) | ORAL | Status: AC
  Administered 2020-03-14 (×2): 1000 mg via ORAL
  Filled 2020-03-13 (×3): qty 2

## 2020-03-13 MED ORDER — KETOROLAC TROMETHAMINE 30 MG/ML IJ SOLN
INTRAMUSCULAR | Status: DC | PRN
  Administered 2020-03-13: 30 mg via INTRAVENOUS

## 2020-03-13 MED ORDER — ROCURONIUM BROMIDE 10 MG/ML (PF) SYRINGE
PREFILLED_SYRINGE | INTRAVENOUS | Status: AC
  Filled 2020-03-13: qty 10

## 2020-03-13 MED ORDER — HYDROMORPHONE HCL 1 MG/ML IJ SOLN
0.5000 mg | INTRAMUSCULAR | Status: DC | PRN
  Administered 2020-03-13: 0.5 mg via INTRAVENOUS

## 2020-03-13 MED ORDER — FENTANYL CITRATE (PF) 100 MCG/2ML IJ SOLN
INTRAMUSCULAR | Status: AC
  Filled 2020-03-13: qty 2

## 2020-03-13 MED ORDER — SEVOFLURANE IN SOLN
RESPIRATORY_TRACT | Status: AC
  Filled 2020-03-13: qty 250

## 2020-03-13 MED ORDER — OXYCODONE HCL 5 MG PO TABS
5.0000 mg | ORAL_TABLET | ORAL | Status: DC | PRN
  Administered 2020-03-14 (×3): 5 mg via ORAL
  Administered 2020-03-15 – 2020-03-16 (×4): 10 mg via ORAL
  Administered 2020-03-17: 5 mg via ORAL
  Filled 2020-03-13: qty 1
  Filled 2020-03-13: qty 2
  Filled 2020-03-13: qty 1
  Filled 2020-03-13: qty 2
  Filled 2020-03-13: qty 1
  Filled 2020-03-13 (×2): qty 2
  Filled 2020-03-13: qty 1
  Filled 2020-03-13: qty 2

## 2020-03-13 MED ORDER — GLYCOPYRROLATE 0.2 MG/ML IJ SOLN
INTRAMUSCULAR | Status: AC
  Filled 2020-03-13: qty 1

## 2020-03-13 MED ORDER — MAGNESIUM CITRATE PO SOLN
1.0000 | Freq: Once | ORAL | Status: DC | PRN
  Filled 2020-03-13: qty 296

## 2020-03-13 MED ORDER — PHENOL 1.4 % MT LIQD
1.0000 | OROMUCOSAL | Status: DC | PRN
  Filled 2020-03-13: qty 177

## 2020-03-13 MED ORDER — ACETAMINOPHEN 325 MG PO TABS: 325.0000 mg | ORAL_TABLET | Freq: Four times a day (QID) | ORAL | Status: DC | PRN

## 2020-03-13 MED ORDER — SUGAMMADEX SODIUM 500 MG/5ML IV SOLN
INTRAVENOUS | Status: AC
  Filled 2020-03-13: qty 5

## 2020-03-13 MED ORDER — METOCLOPRAMIDE HCL 10 MG PO TABS: 5.0000 mg | ORAL_TABLET | Freq: Three times a day (TID) | ORAL | Status: DC | PRN

## 2020-03-13 MED ORDER — CEFAZOLIN SODIUM-DEXTROSE 2-4 GM/100ML-% IV SOLN
2.0000 g | INTRAVENOUS | Status: AC
  Administered 2020-03-13: 2 g via INTRAVENOUS

## 2020-03-13 MED ORDER — DIPHENHYDRAMINE HCL 12.5 MG/5ML PO ELIX: 12.5000 mg | ORAL_SOLUTION | ORAL | Status: DC | PRN

## 2020-03-13 MED ORDER — NEOMYCIN-POLYMYXIN B GU 40-200000 IR SOLN
Status: DC | PRN
  Administered 2020-03-13: 2 mL

## 2020-03-13 MED ORDER — METHOCARBAMOL 500 MG PO TABS: 500.0000 mg | ORAL_TABLET | Freq: Four times a day (QID) | ORAL | Status: DC | PRN

## 2020-03-13 MED ORDER — ORAL CARE MOUTH RINSE: 15.0000 mL | Freq: Once | OROMUCOSAL | Status: AC

## 2020-03-13 MED ORDER — FENTANYL CITRATE (PF) 100 MCG/2ML IJ SOLN
25.0000 ug | INTRAMUSCULAR | Status: AC | PRN
  Administered 2020-03-13 (×6): 25 ug via INTRAVENOUS

## 2020-03-13 MED ORDER — ACETAMINOPHEN 10 MG/ML IV SOLN
1000.0000 mg | Freq: Once | INTRAVENOUS | Status: AC
  Administered 2020-03-13: 1000 mg via INTRAVENOUS
  Filled 2020-03-13: qty 100

## 2020-03-13 MED ORDER — FENTANYL CITRATE (PF) 100 MCG/2ML IJ SOLN
INTRAMUSCULAR | Status: DC | PRN
  Administered 2020-03-13: 100 ug via INTRAVENOUS
  Administered 2020-03-13: 25 ug via INTRAVENOUS
  Administered 2020-03-13: 50 ug via INTRAVENOUS
  Administered 2020-03-13: 25 ug via INTRAVENOUS

## 2020-03-13 MED ORDER — PROPOFOL 500 MG/50ML IV EMUL
INTRAVENOUS | Status: AC
  Filled 2020-03-13: qty 50

## 2020-03-13 MED ORDER — ONDANSETRON HCL 4 MG/2ML IJ SOLN
INTRAMUSCULAR | Status: DC | PRN
  Administered 2020-03-13: 4 mg via INTRAVENOUS

## 2020-03-13 MED ORDER — DEXAMETHASONE SODIUM PHOSPHATE 10 MG/ML IJ SOLN
INTRAMUSCULAR | Status: DC | PRN
  Administered 2020-03-13: 10 mg via INTRAVENOUS

## 2020-03-13 MED ORDER — BISACODYL 10 MG RE SUPP
10.0000 mg | Freq: Every day | RECTAL | Status: DC | PRN
Start: 2020-03-13 — End: 2020-03-17

## 2020-03-13 MED ORDER — CEFAZOLIN SODIUM-DEXTROSE 2-4 GM/100ML-% IV SOLN
INTRAVENOUS | Status: AC
  Filled 2020-03-13: qty 100

## 2020-03-13 MED ORDER — ONDANSETRON HCL 4 MG/2ML IJ SOLN
INTRAMUSCULAR | Status: AC
  Filled 2020-03-13: qty 6

## 2020-03-13 MED ORDER — DOCUSATE SODIUM 100 MG PO CAPS
100.0000 mg | ORAL_CAPSULE | Freq: Two times a day (BID) | ORAL | Status: DC
  Administered 2020-03-13 – 2020-03-17 (×8): 100 mg via ORAL
  Filled 2020-03-13 (×8): qty 1

## 2020-03-13 MED ORDER — ROCURONIUM BROMIDE 100 MG/10ML IV SOLN
INTRAVENOUS | Status: DC | PRN
  Administered 2020-03-13: 20 mg via INTRAVENOUS
  Administered 2020-03-13: 10 mg via INTRAVENOUS
  Administered 2020-03-13: 30 mg via INTRAVENOUS
  Administered 2020-03-13: 10 mg via INTRAVENOUS
  Administered 2020-03-13: 20 mg via INTRAVENOUS
  Administered 2020-03-13: 10 mg via INTRAVENOUS

## 2020-03-13 MED ORDER — HYDROMORPHONE HCL 1 MG/ML IJ SOLN
INTRAMUSCULAR | Status: AC
  Administered 2020-03-13: 0.5 mg via INTRAVENOUS
  Filled 2020-03-13: qty 1

## 2020-03-13 MED ORDER — SUCCINYLCHOLINE CHLORIDE 20 MG/ML IJ SOLN
INTRAMUSCULAR | Status: DC | PRN
  Administered 2020-03-13: 120 mg via INTRAVENOUS

## 2020-03-13 MED ORDER — ENALAPRIL MALEATE 20 MG PO TABS
40.0000 mg | ORAL_TABLET | Freq: Every day | ORAL | Status: DC
  Administered 2020-03-13 – 2020-03-17 (×5): 40 mg via ORAL
  Filled 2020-03-13 (×5): qty 2

## 2020-03-13 MED ORDER — MELATONIN 5 MG PO TABS: 10.0000 mg | ORAL_TABLET | Freq: Every evening | ORAL | Status: DC | PRN

## 2020-03-13 MED ORDER — PANTOPRAZOLE SODIUM 40 MG PO TBEC
80.0000 mg | DELAYED_RELEASE_TABLET | Freq: Every day | ORAL | Status: DC
  Administered 2020-03-14 – 2020-03-17 (×4): 80 mg via ORAL
  Filled 2020-03-13 (×4): qty 2

## 2020-03-13 MED ORDER — LIDOCAINE HCL (PF) 2 % IJ SOLN
INTRAMUSCULAR | Status: AC
  Filled 2020-03-13: qty 15

## 2020-03-13 MED ORDER — SUGAMMADEX SODIUM 500 MG/5ML IV SOLN
INTRAVENOUS | Status: DC | PRN
  Administered 2020-03-13: 250 mg via INTRAVENOUS

## 2020-03-13 MED ORDER — MIDAZOLAM HCL 5 MG/5ML IJ SOLN
INTRAMUSCULAR | Status: DC | PRN
  Administered 2020-03-13: 2 mg via INTRAVENOUS

## 2020-03-13 MED ORDER — ONDANSETRON HCL 4 MG/2ML IJ SOLN
4.0000 mg | Freq: Four times a day (QID) | INTRAMUSCULAR | Status: DC | PRN
  Administered 2020-03-13: 4 mg via INTRAVENOUS

## 2020-03-13 MED ORDER — PROPOFOL 10 MG/ML IV BOLUS
INTRAVENOUS | Status: DC | PRN
  Administered 2020-03-13: 160 mg via INTRAVENOUS

## 2020-03-13 SURGICAL SUPPLY — 70 items
BLADE SAGITTAL 25.0X1.19X90 (BLADE) ×2 IMPLANT
BLADE SAW 90X13X1.19 OSCILLAT (BLADE) ×2 IMPLANT
BNDG ELASTIC 6X5.8 VLCR STR LF (GAUZE/BANDAGES/DRESSINGS) ×2 IMPLANT
CANISTER SUCT 1200ML W/VALVE (MISCELLANEOUS) ×2 IMPLANT
CANISTER SUCT 3000ML PPV (MISCELLANEOUS) ×4 IMPLANT
CANISTER WOUND CARE 500ML ATS (WOUND CARE) ×2 IMPLANT
CEMENT HV SMART SET (Cement) ×4 IMPLANT
CHLORAPREP W/TINT 26 (MISCELLANEOUS) ×4 IMPLANT
COMP FEMORAL LT SZ 5P SPHERE (Femur) ×2 IMPLANT
COOLER POLAR GLACIER W/PUMP (MISCELLANEOUS) ×2 IMPLANT
COVER WAND RF STERILE (DRAPES) ×2 IMPLANT
CUFF TOURN SGL QUICK 24 (TOURNIQUET CUFF)
CUFF TOURN SGL QUICK 30 (TOURNIQUET CUFF)
CUFF TRNQT CYL 24X4X16.5-23 (TOURNIQUET CUFF) IMPLANT
CUFF TRNQT CYL 30X4X21-28X (TOURNIQUET CUFF) IMPLANT
DRAPE 3/4 80X56 (DRAPES) ×4 IMPLANT
DRSG MEPILEX SACRM 8.7X9.8 (GAUZE/BANDAGES/DRESSINGS) ×2 IMPLANT
ELECT CAUTERY BLADE 6.4 (BLADE) ×2 IMPLANT
ELECT REM PT RETURN 9FT ADLT (ELECTROSURGICAL) ×2
ELECTRODE REM PT RTRN 9FT ADLT (ELECTROSURGICAL) ×1 IMPLANT
GAUZE SPONGE 4X4 12PLY STRL (GAUZE/BANDAGES/DRESSINGS) ×2 IMPLANT
GAUZE XEROFORM 1X8 LF (GAUZE/BANDAGES/DRESSINGS) ×2 IMPLANT
GLOVE BIOGEL PI IND STRL 9 (GLOVE) ×1 IMPLANT
GLOVE BIOGEL PI INDICATOR 9 (GLOVE) ×1
GLOVE INDICATOR 8.0 STRL GRN (GLOVE) ×2 IMPLANT
GLOVE SURG ORTHO 8.0 STRL STRW (GLOVE) ×2 IMPLANT
GLOVE SURG SYN 9.0  PF PI (GLOVE) ×2
GLOVE SURG SYN 9.0 PF PI (GLOVE) ×1 IMPLANT
GOWN SRG 2XL LVL 4 RGLN SLV (GOWNS) ×1 IMPLANT
GOWN STRL NON-REIN 2XL LVL4 (GOWNS) ×2
GOWN STRL REUS W/ TWL LRG LVL3 (GOWN DISPOSABLE) ×1 IMPLANT
GOWN STRL REUS W/ TWL XL LVL3 (GOWN DISPOSABLE) ×1 IMPLANT
GOWN STRL REUS W/TWL LRG LVL3 (GOWN DISPOSABLE) ×2
GOWN STRL REUS W/TWL XL LVL3 (GOWN DISPOSABLE) ×2
HOLDER FOLEY CATH W/STRAP (MISCELLANEOUS) ×2 IMPLANT
HOOD PEEL AWAY FLYTE STAYCOOL (MISCELLANEOUS) ×4 IMPLANT
IRRIGATION SURGIPHOR STRL (IV SOLUTION) IMPLANT
KIT PREVENA INCISION MGT20CM45 (CANNISTER) ×2 IMPLANT
KIT TURNOVER KIT A (KITS) ×2 IMPLANT
MANIFOLD NEPTUNE II (INSTRUMENTS) ×2 IMPLANT
NDL SAFETY ECLIPSE 18X1.5 (NEEDLE) ×1 IMPLANT
NEEDLE HYPO 18GX1.5 SHARP (NEEDLE) ×2
NEEDLE SPNL 18GX3.5 QUINCKE PK (NEEDLE) ×2 IMPLANT
NEEDLE SPNL 20GX3.5 QUINCKE YW (NEEDLE) ×2 IMPLANT
NS IRRIG 1000ML POUR BTL (IV SOLUTION) ×2 IMPLANT
PACK TOTAL KNEE (MISCELLANEOUS) ×2 IMPLANT
PAD WRAPON POLAR KNEE (MISCELLANEOUS) ×1 IMPLANT
PATELLA SZ4 CEMENTED (Joint) ×2 IMPLANT
PENCIL SMOKE EVACUATOR COATED (MISCELLANEOUS) ×2 IMPLANT
PULSAVAC PLUS IRRIG FAN TIP (DISPOSABLE) ×2
SCALPEL PROTECTED #10 DISP (BLADE) ×4 IMPLANT
SOL .9 NS 3000ML IRR  AL (IV SOLUTION) ×2
SOL .9 NS 3000ML IRR UROMATIC (IV SOLUTION) ×1 IMPLANT
STAPLER SKIN PROX 35W (STAPLE) ×2 IMPLANT
STEM EXTENSION 11MMX30MM (Stem) ×2 IMPLANT
SUCTION FRAZIER HANDLE 10FR (MISCELLANEOUS) ×2
SUCTION TUBE FRAZIER 10FR DISP (MISCELLANEOUS) ×1 IMPLANT
SUT DVC 2 QUILL PDO  T11 36X36 (SUTURE) ×2
SUT DVC 2 QUILL PDO T11 36X36 (SUTURE) ×1 IMPLANT
SUT ETHIBOND 2 V 37 (SUTURE) IMPLANT
SUT V-LOC 90 ABS DVC 3-0 CL (SUTURE) ×2 IMPLANT
SYR 20ML LL LF (SYRINGE) ×2 IMPLANT
SYR 50ML LL SCALE MARK (SYRINGE) ×4 IMPLANT
TIBIAL INSERT SZ4 LT 02120410F (Insert) ×2 IMPLANT
TIBIAL TRAY FIXED MEDACTA 0207 (Bone Implant) ×2 IMPLANT
TIP FAN IRRIG PULSAVAC PLUS (DISPOSABLE) ×1 IMPLANT
TOWEL OR 17X26 4PK STRL BLUE (TOWEL DISPOSABLE) ×2 IMPLANT
TOWER CARTRIDGE SMART MIX (DISPOSABLE) ×2 IMPLANT
TRAY FOLEY MTR SLVR 16FR STAT (SET/KITS/TRAYS/PACK) ×2 IMPLANT
WRAPON POLAR PAD KNEE (MISCELLANEOUS) ×2

## 2020-03-13 NOTE — Transfer of Care (Signed)
Immediate Anesthesia Transfer of Care Note  Patient: Nicholas Thompson  Procedure(s) Performed: Left total knee arthroplasty - Rachelle Hora to Assist (Left Knee)  Patient Location: PACU  Anesthesia Type:General  Level of Consciousness: drowsy  Airway & Oxygen Therapy: Patient Spontanous Breathing and Patient connected to face mask oxygen  Post-op Assessment: Report given to RN and Post -op Vital signs reviewed and stable  Post vital signs: Reviewed and stable  Last Vitals:  Vitals Value Taken Time  BP 146/71 03/13/20 1220  Temp 36.5 C 03/13/20 1219  Pulse 71 03/13/20 1221  Resp 21 03/13/20 1221  SpO2 99 % 03/13/20 1221  Vitals shown include unvalidated device data.  Last Pain:  Vitals:   03/13/20 0826  TempSrc: Oral  PainSc: 0-No pain         Complications: No complications documented.

## 2020-03-13 NOTE — Plan of Care (Signed)
  Problem: Education: Goal: Knowledge of General Education information will improve Description: Including pain rating scale, medication(s)/side effects and non-pharmacologic comfort measures 03/13/2020 1642 by Cristela Blue, RN Outcome: Progressing 03/13/2020 1642 by Cristela Blue, RN Outcome: Progressing   Problem: Health Behavior/Discharge Planning: Goal: Ability to manage health-related needs will improve 03/13/2020 1642 by Cristela Blue, RN Outcome: Progressing 03/13/2020 1642 by Cristela Blue, RN Outcome: Progressing   Problem: Clinical Measurements: Goal: Ability to maintain clinical measurements within normal limits will improve 03/13/2020 1642 by Cristela Blue, RN Outcome: Progressing 03/13/2020 1642 by Cristela Blue, RN Outcome: Progressing Goal: Will remain free from infection 03/13/2020 1642 by Cristela Blue, RN Outcome: Progressing 03/13/2020 1642 by Cristela Blue, RN Outcome: Progressing Goal: Diagnostic test results will improve 03/13/2020 1642 by Cristela Blue, RN Outcome: Progressing 03/13/2020 1642 by Cristela Blue, RN Outcome: Progressing Goal: Respiratory complications will improve 03/13/2020 1642 by Cristela Blue, RN Outcome: Progressing 03/13/2020 1642 by Cristela Blue, RN Outcome: Progressing Goal: Cardiovascular complication will be avoided 03/13/2020 1642 by Cristela Blue, RN Outcome: Progressing 03/13/2020 1642 by Cristela Blue, RN Outcome: Progressing   Problem: Activity: Goal: Risk for activity intolerance will decrease 03/13/2020 1642 by Cristela Blue, RN Outcome: Progressing 03/13/2020 1642 by Cristela Blue, RN Outcome: Progressing   Problem: Nutrition: Goal: Adequate nutrition will be maintained 03/13/2020 1642 by Cristela Blue, RN Outcome: Progressing 03/13/2020 1642 by Cristela Blue, RN Outcome: Progressing   Problem: Coping: Goal: Level of anxiety will decrease 03/13/2020 1642 by Cristela Blue, RN Outcome:  Progressing 03/13/2020 1642 by Cristela Blue, RN Outcome: Progressing   Problem: Elimination: Goal: Will not experience complications related to bowel motility 03/13/2020 1642 by Cristela Blue, RN Outcome: Progressing 03/13/2020 1642 by Cristela Blue, RN Outcome: Progressing Goal: Will not experience complications related to urinary retention 03/13/2020 1642 by Cristela Blue, RN Outcome: Progressing 03/13/2020 1642 by Cristela Blue, RN Outcome: Progressing   Problem: Pain Managment: Goal: General experience of comfort will improve 03/13/2020 1642 by Cristela Blue, RN Outcome: Progressing 03/13/2020 1642 by Cristela Blue, RN Outcome: Progressing   Problem: Safety: Goal: Ability to remain free from injury will improve 03/13/2020 1642 by Cristela Blue, RN Outcome: Progressing 03/13/2020 1642 by Cristela Blue, RN Outcome: Progressing   Problem: Skin Integrity: Goal: Risk for impaired skin integrity will decrease 03/13/2020 1642 by Cristela Blue, RN Outcome: Progressing 03/13/2020 1642 by Cristela Blue, RN Outcome: Progressing   Problem: Education: Goal: Knowledge of the prescribed therapeutic regimen will improve 03/13/2020 1642 by Cristela Blue, RN Outcome: Progressing 03/13/2020 1642 by Cristela Blue, RN Outcome: Progressing Goal: Individualized Educational Video(s) 03/13/2020 1642 by Cristela Blue, RN Outcome: Progressing 03/13/2020 1642 by Cristela Blue, RN Outcome: Progressing   Problem: Activity: Goal: Ability to avoid complications of mobility impairment will improve 03/13/2020 1642 by Cristela Blue, RN Outcome: Progressing 03/13/2020 1642 by Cristela Blue, RN Outcome: Progressing Goal: Range of joint motion will improve 03/13/2020 1642 by Cristela Blue, RN Outcome: Progressing 03/13/2020 1642 by Cristela Blue, RN Outcome: Progressing   Problem: Clinical Measurements: Goal: Postoperative complications will be avoided or  minimized 03/13/2020 1642 by Cristela Blue, RN Outcome: Progressing 03/13/2020 1642 by Cristela Blue, RN Outcome: Progressing   Problem: Pain Management: Goal: Pain level will decrease with appropriate interventions 03/13/2020 1642 by Cristela Blue, RN Outcome: Progressing 03/13/2020 1642 by Cristela Blue, RN Outcome: Progressing   Problem: Skin Integrity: Goal: Will show signs of wound healing 03/13/2020 1642 by Cristela Blue, RN Outcome: Progressing 03/13/2020 1642 by Cristela Blue, RN Outcome: Progressing

## 2020-03-13 NOTE — Anesthesia Preprocedure Evaluation (Addendum)
Anesthesia Evaluation  Patient identified by MRN, date of birth, ID band Patient awake    Reviewed: Allergy & Precautions, NPO status , Patient's Chart, lab work & pertinent test results  History of Anesthesia Complications (+) PONV and history of anesthetic complications  Airway Mallampati: II       Dental  (+) Partial Upper, Lower Dentures   Pulmonary neg sleep apnea, neg COPD, former smoker,    Pulmonary exam normal        Cardiovascular hypertension, Pt. on medications (-) Past MI and (-) CHF Normal cardiovascular exam(-) dysrhythmias (-) Valvular Problems/Murmurs     Neuro/Psych neg Seizures    GI/Hepatic Neg liver ROS, GERD  Medicated and Controlled,  Endo/Other  neg diabetes  Renal/GU Renal InsufficiencyRenal disease (s/p nephrectomy)     Musculoskeletal   Abdominal Normal abdominal exam  (+)   Peds  Hematology   Anesthesia Other Findings   Reproductive/Obstetrics                            Anesthesia Physical  Anesthesia Plan  ASA: III  Anesthesia Plan: General   Post-op Pain Management:    Induction: Intravenous  PONV Risk Score and Plan:   Airway Management Planned: Oral ETT  Additional Equipment:   Intra-op Plan:   Post-operative Plan: Extubation in OR  Informed Consent: I have reviewed the patients History and Physical, chart, labs and discussed the procedure including the risks, benefits and alternatives for the proposed anesthesia with the patient or authorized representative who has indicated his/her understanding and acceptance.     Dental advisory given  Plan Discussed with: CRNA and Surgeon  Anesthesia Plan Comments: (Patient refuses spinal.  He understands that he may wake up with more discomfort.)      Anesthesia Quick Evaluation

## 2020-03-13 NOTE — Evaluation (Signed)
Physical Therapy Evaluation Patient Details Name: Nicholas Thompson MRN: [name] DOB: [birth year] Today's Date: 03/13/2020   History of Present Illness  64 y/o male s/p L TKA 12/23, h/o L total hip 08/2017.  Clinical Impression  Pt had been nauseated and vomiting since surgery (has known history of issues with anesthesia).  Shortly after initiating PT exam he had a bout of copious vomiting; However he was very motivated to work with PT and despite frequent feelings of nausea and need for calming rest breaks to feel better he did not vomit again during session.  He is quite pain limited but did show great resolve to do as much as he could and push himself.  He lacks TKE and did not have strong quad set, but did improve with exercises, cuing, education and reinforcement.  Pt unable to do SLR, and in standing did have about of buckling but with cues to engage quad he was able to do some WBing/side shuffle steps w/o further buckling (heavy reliance on the walker and constant cuing).  He has ~70 degrees of flexion, but clearly had a lot of pain to get there (first few reps were in the 40-50 range with considerable pain and stiffness.)  Pt is very motivated to go home and willing to participate.  He expects to do better as nausea and pain improve, recommending STR at this point per continued gains.    Follow Up Recommendations SNF (very much hoping to go home, doable per progress)    Equipment Recommendations  None recommended by PT    Recommendations for Other Services       Precautions / Restrictions Precautions Precautions: Fall;Knee Precaution Booklet Issued: Yes (comment) Restrictions Weight Bearing Restrictions: Yes LLE Weight Bearing: Weight bearing as tolerated      Mobility  Bed Mobility Overal bed mobility: Needs Assistance Bed Mobility: Supine to Sit;Sit to Supine     Supine to sit: Min assist Sit to supine: Min assist   General bed mobility comments: assist with L LE for getting  up from and back to supine    Transfers Overall transfer level: Needs assistance Equipment used: Rolling walker (2 wheeled) Transfers: Sit to/from Stand Sit to Stand: Min assist;From elevated surface         General transfer comment: Pt motivated to try and stand, not feeling  Ambulation/Gait             General Gait Details: able to take a few small EOB steps with heavy reliance on UEs, needed to deliberately engage L quad to arrest buckling  Stairs            Wheelchair Mobility    Modified Rankin (Stroke Patients Only)       Balance Overall balance assessment: Needs assistance   Sitting balance-[name]: Good       Standing balance-[name]: Fair Standing balance comment: reliant on walker                             Pertinent Vitals/Pain Pain Assessment: 0-10 Pain Score: 6  Pain Location: 10/10 pain in knee during ROM tasks    [place] expects to be discharged to:: Private residence Living Arrangements: Spouse/significant other Available Help at Discharge: Family (will have multiple adults home initially to assist as well) Type of Home: House Home Access: Stairs to enter Entrance Stairs-Rails: Right;Left (too wide to use both) Entrance Stairs-Number of Steps: 3 Home Layout: One  level Home Equipment: Walker - 2 wheels;Bedside commode      Prior Function Level of Independence: Independent               Hand Dominance        Extremity/Trunk Assessment   Upper Extremity Assessment Upper Extremity Assessment: Overall WFL for tasks assessed    Lower Extremity Assessment Lower Extremity Assessment:  (R WFL, L pain limited post op, minimal AROM t/o)       Communication   Communication: No difficulties  Cognition Arousal/Alertness: Awake/alert Behavior During Therapy: WFL for tasks assessed/performed Overall Cognitive Status: Within Functional Limits for tasks assessed                                         General Comments      Exercises Total Joint Exercises Ankle Circles/Pumps: AROM;10 reps Quad Sets: Strengthening;10 reps Heel Slides: PROM;AAROM;5 reps Hip ABduction/ADduction: AROM;10 reps Straight Leg Raises: PROM;AAROM;5 reps Knee Flexion: PROM;5 reps Goniometric ROM: 3-70   Assessment/Plan    PT Assessment Patient needs continued PT services  PT Problem List Decreased strength;Decreased range of motion;Decreased activity tolerance;Decreased balance;Decreased mobility;Decreased knowledge of use of DME;Decreased safety awareness;Pain       PT Treatment Interventions DME instruction;Gait training;Stair training;Functional mobility training;Therapeutic activities;Therapeutic exercise;Balance training;Neuromuscular re-education;Patient/family education    PT Goals (Current goals can be found in the Care Plan section)  Acute Rehab PT Goals Patient Stated Goal: go home PT Goal Formulation: With patient Time For Goal Achievement: 03/27/20 Potential to Achieve Goals: Good    Frequency BID   Barriers to discharge        Co-evaluation               AM-PAC PT "6 Clicks" Mobility  Outcome Measure Help needed turning from your back to your side while in a flat bed without using bedrails?: A Little Help needed moving from lying on your back to sitting on the side of a flat bed without using bedrails?: A Little Help needed moving to and from a bed to a chair (including a wheelchair)?: A Lot Help needed standing up from a chair using your arms (e.g., wheelchair or bedside chair)?: A Little Help needed to walk in hospital room?: A Lot Help needed climbing 3-5 steps with a railing? : A Lot 6 Click Score: 15    End of Session Equipment Utilized During Treatment: Gait belt Activity Tolerance: No increased pain (nauseated post sx) Patient left: with bed alarm set;with call bell/phone within reach Nurse Communication: Mobility status;Patient  requests pain meds PT Visit Diagnosis: Muscle weakness (generalized) (M62.81);Difficulty in walking, not elsewhere classified (R26.2);Pain Pain - Right/Left: Left Pain - part of body: Knee    Time: 9937-1696 PT Time Calculation (min) (ACUTE ONLY): 49 min   Charges:   PT Evaluation $PT Eval Low Complexity: 1 Low PT Treatments $Gait Training: 8-22 mins $Therapeutic Exercise: 8-22 mins        Kreg Shropshire, DPT 03/13/2020, 7:06 PM
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
## 2020-03-13 NOTE — Op Note (Signed)
03/13/2020  12:31 PM  PATIENT:  Nicholas Thompson   MRN: [id number]  PRE-OPERATIVE DIAGNOSIS:  Primary localized osteoarthritis of left knee   POST-OPERATIVE DIAGNOSIS:  Same   PROCEDURE:  Procedure(s): Left TOTAL KNEE ARTHROPLASTY   SURGEON: Laurene Footman, MD   ASSISTANTS: Rachelle Hora, PA-C   ANESTHESIA:   general   EBL:     BLOOD ADMINISTERED:none   DRAINS: Incisional wound VAC    LOCAL MEDICATIONS USED:  MARCAINE    and OTHER Exparel and morphine   SPECIMEN:  No Specimen   DISPOSITION OF SPECIMEN:  N/A   COUNTS:  YES   TOURNIQUET:   71 minutes at 300 mm Hg   IMPLANTS: Medacta  GMK sphere system with  5+ left femur, 4 left tibia with short stem and  10 mm insert.  Size  4 patella, all components cemented.   DICTATION: Viviann Spare Dictation   patient was brought to the operating room and general anesthesia was obtained.  After prepping and draping the  left leg in sterile fashion, and after patient identification and timeout procedures were completed, tourniquet was raised  and midline skin incision was made followed by medial parapatellar arthrotomy with  severe medial compartment osteoarthritis, severe patellofemoral arthritis and  mild lateral compartment arthritis, partial synovectomy was also carried out.   The ACL and PCL and fat pad were excised along with anterior horns of the meniscus. The proximal tibia cutting guide from  the extra medullary system was applied and the proximal tibia cut carried out.  The distal femoral cut was carried out in a similar fashion  using intramedullary guide    sizing guide placed and drill holes made the  5+ femoral cutting guide applied with anterior posterior and chamfer cuts made.  The posterior horns of the menisci were removed at this point.   Injection of the above medication was carried out after the femoral and tibial cuts were carried out.  The  for baseplate trial was placed pinned into position and proximal tibial preparation carried  out with drilling hand reaming and the keel punch followed by placement of the  5+ femur and sizing the tibial insert size   10 millimeter gave the best fit with stability and full extension.  The distal femoral drill holes were made in the notch cut for the trochlear groove was then carried out with trials were then removed the patella was cut using the patellar cutting guide and it sized to a size  for after drill holes have been made  The knee was irrigated with pulsatile lavage and the bony surfaces dried the tibial component was cemented into place first.  Excess cement was removed and the polyethylene insert placed with a torque screw placed with a torque screwdriver tightened.  The distal femoral component was placed and the knee was held in extension as the patellar button was clamped into place.  After the cement was set, excess cement was removed and the knee was again irrigated thoroughly thoroughly irrigated.  The tourniquet was let down and hemostasis checked with electrocautery. The arthrotomy was repaired with a heavy Quill suture,  followed by 3-0 V lock subcuticular closure, skin staples followed by incisional wound VAC and Polar Care.Marland Kitchen   PLAN OF CARE: Admit for overnight observation   PATIENT DISPOSITION:  PACU - hemodynamically stable.

## 2020-03-13 NOTE — Anesthesia Procedure Notes (Signed)
Procedure Name: Intubation Date/Time: 03/13/2020 10:13 AM Performed by: Lily Peer, Simran Bomkamp, CRNA Pre-anesthesia Checklist: Patient identified, Emergency Drugs available, Suction available and Patient being monitored Patient Re-evaluated:Patient Re-evaluated prior to induction Oxygen Delivery Method: Circle system utilized Preoxygenation: Pre-oxygenation with 100% oxygen Induction Type: IV induction Ventilation: Oral airway inserted - appropriate to patient size and Two handed mask ventilation required Laryngoscope Size: McGraph and 4 Grade View: Grade I Tube type: Oral Tube size: 7.5 mm Number of attempts: 1 Airway Equipment and Method: Stylet Placement Confirmation: ETT inserted through vocal cords under direct vision,  positive ETCO2 and breath sounds checked- equal and bilateral Secured at: 22 cm Tube secured with: Tape Dental Injury: Teeth and Oropharynx as per pre-operative assessment

## 2020-03-14 DIAGNOSIS — M1712 Unilateral primary osteoarthritis, left knee: Secondary | ICD-10-CM | POA: Diagnosis not present

## 2020-03-14 DIAGNOSIS — Z96642 Presence of left artificial hip joint: Secondary | ICD-10-CM | POA: Diagnosis not present

## 2020-03-14 DIAGNOSIS — I129 Hypertensive chronic kidney disease with stage 1 through stage 4 chronic kidney disease, or unspecified chronic kidney disease: Secondary | ICD-10-CM | POA: Diagnosis not present

## 2020-03-14 DIAGNOSIS — Z905 Acquired absence of kidney: Secondary | ICD-10-CM | POA: Diagnosis not present

## 2020-03-14 DIAGNOSIS — Z85528 Personal history of other malignant neoplasm of kidney: Secondary | ICD-10-CM | POA: Diagnosis not present

## 2020-03-14 DIAGNOSIS — N189 Chronic kidney disease, unspecified: Secondary | ICD-10-CM | POA: Diagnosis not present

## 2020-03-14 LAB — BASIC METABOLIC PANEL
Anion gap: 9 (ref 5–15)
BUN: 19 mg/dL (ref 8–23)
CO2: 25 mmol/L (ref 22–32)
Calcium: 8.5 mg/dL — ABNORMAL LOW (ref 8.9–10.3)
Chloride: 103 mmol/L (ref 98–111)
Creatinine, Ser: 0.95 mg/dL (ref 0.61–1.24)
GFR, Estimated: >60 mL/min (ref >60)
Glucose, Bld: 125 mg/dL — ABNORMAL HIGH (ref 70–99)
Potassium: 4.2 mmol/L (ref 3.5–5.1)
Sodium: 137 mmol/L (ref 135–145)

## 2020-03-14 LAB — CBC
HCT: 37.5 % — ABNORMAL LOW (ref 39.0–52.0)
Hemoglobin: 12.9 g/dL — ABNORMAL LOW (ref 13.0–17.0)
MCH: 31.6 pg (ref 26.0–34.0)
MCHC: 34.4 g/dL (ref 30.0–36.0)
MCV: 91.9 fL (ref 80.0–100.0)
Platelets: 266 10*3/uL (ref 150–400)
RBC: 4.08 MIL/uL — ABNORMAL LOW (ref 4.22–5.81)
RDW: 12 % (ref 11.5–15.5)
WBC: 18.7 10*3/uL — ABNORMAL HIGH (ref 4.0–10.5)
nRBC: 0 % (ref 0.0–0.2)

## 2020-03-14 MED ORDER — MAGNESIUM HYDROXIDE 400 MG/5ML PO SUSP
30.0000 mL | Freq: Once | ORAL | Status: DC
  Filled 2020-03-14: qty 30

## 2020-03-14 MED ORDER — METHOCARBAMOL 500 MG PO TABS: 500.0000 mg | ORAL_TABLET | Freq: Four times a day (QID) | ORAL | 0 refills | Status: DC | PRN

## 2020-03-14 MED ORDER — OXYCODONE HCL 5 MG PO TABS: 5.0000 mg | ORAL_TABLET | ORAL | 0 refills | Status: DC | PRN

## 2020-03-14 MED ORDER — ACETAMINOPHEN 325 MG PO TABS: 325.0000 mg | ORAL_TABLET | Freq: Four times a day (QID) | ORAL | Status: AC | PRN

## 2020-03-14 MED ORDER — SCOPOLAMINE 1 MG/3DAYS TD PT72: 1.0000 | MEDICATED_PATCH | TRANSDERMAL | 0 refills | Status: DC

## 2020-03-14 MED ORDER — ENOXAPARIN SODIUM 40 MG/0.4ML ~~LOC~~ SOLN: 40.0000 mg | SUBCUTANEOUS | 0 refills | Status: DC

## 2020-03-14 MED ORDER — TRAMADOL HCL 50 MG PO TABS: 50.0000 mg | ORAL_TABLET | Freq: Four times a day (QID) | ORAL | 0 refills | Status: DC | PRN

## 2020-03-14 NOTE — Anesthesia Postprocedure Evaluation (Signed)
Anesthesia Post Note  Patient: Nicholas Thompson  Procedure(s) Performed: Left total knee arthroplasty - Rachelle Hora to Assist (Left Knee)  Patient location during evaluation: Nursing Unit Anesthesia Type: Spinal Level of consciousness: oriented and awake and alert Pain management: pain level controlled Vital Signs Assessment: post-procedure vital signs reviewed and stable Respiratory status: spontaneous breathing, respiratory function stable and patient connected to nasal cannula oxygen Cardiovascular status: blood pressure returned to baseline and stable Postop Assessment: no headache, no backache and no apparent nausea or vomiting Anesthetic complications: no   No complications documented.   Last Vitals:  Vitals:   03/14/20 0445 03/14/20 0823  BP: (!) 118/59 135/65  Pulse: 65 70  Resp: 18 17  Temp: 36.7 C 37 C  SpO2: 96% 100%    Last Pain:  Vitals:   03/14/20 0911  TempSrc:   PainSc: 2                  Arita Miss

## 2020-03-14 NOTE — Evaluation (Signed)
Occupational Therapy Evaluation Patient Details Name: Nicholas Thompson MRN: [id number] DOB: 1956-02-20 Today's Date: 03/14/2020    History of Present Illness 64 y/o male s/p L TKA 12/23, h/o L total hip 08/2017.   Clinical Impression   Patient presenting with decreased I in self care, balance, functional mobility/transfer, endurance, and safety awareness.  Patient reports being independent PTA and living with wife. OT reviewed polar care with pt and wife present in room. They verbalized understanding.  Min A for L LE to get to EOB. Pt reports dizziness but no nausea this session. Pt standing and taking several steps to recliner chair with min cuing for hand placement and technique. Pt transferred with RW and min A for balance.  Patient currently functioning at min A overall. Patient will benefit from acute OT to increase overall independence in the areas of ADLs, functional mobility, and safety awareness in order to safely discharge home with wife.    Follow Up Recommendations  No OT follow up;Supervision/Assistance - 24 hour    Equipment Recommendations  None recommended by OT       Precautions / Restrictions Precautions Precautions: Fall;Knee Restrictions Weight Bearing Restrictions: Yes LLE Weight Bearing: Weight bearing as tolerated      Mobility Bed Mobility Overal bed mobility: Needs Assistance Bed Mobility: Supine to Sit;Sit to Supine     Supine to sit: Min assist Sit to supine: Min assist   General bed mobility comments: A with L LE    Transfers Overall transfer level: Needs assistance Equipment used: Rolling walker (2 wheeled) Transfers: Sit to/from Stand Sit to Stand: Min assist;From elevated surface              Balance Overall balance assessment: Needs assistance   Sitting balance-[name]: Good       Standing balance-[name]: Fair                             ADL either performed or assessed with clinical judgement   ADL Overall  ADL's : Needs assistance/impaired     Grooming: Wash/dry hands;Wash/dry face;Sitting       Lower Body Bathing: Minimal assistance;Sit to/from stand       Lower Body Dressing: Minimal assistance;Sit to/from stand   Toilet Transfer: RW;Minimal assistance   Toileting- Water quality scientist and Hygiene: Minimal assistance;Sit to/from stand               Vision Patient Visual Report: No change from baseline              Pertinent Vitals/Pain Pain Assessment: 0-10 Pain Score: 4  Pain Location: L knee Pain Descriptors / Indicators: Discomfort;Aching;Dull Pain Intervention(s): Limited activity within patient's tolerance;Monitored during session;Repositioned     Hand Dominance Right   Extremity/Trunk Assessment Upper Extremity Assessment Upper Extremity Assessment: Overall WFL for tasks assessed   Lower Extremity Assessment Lower Extremity Assessment: Defer to PT evaluation   Cervical / Trunk Assessment Cervical / Trunk Assessment: Normal   Communication Communication Communication: No difficulties   Cognition Arousal/Alertness: Awake/alert Behavior During Therapy: WFL for tasks assessed/performed Overall Cognitive Status: Within Functional Limits for tasks assessed                                                Home Living Family/patient expects to be discharged to:: Private  residence Living Arrangements: Spouse/significant other Available Help at Discharge: Family Type of Home: House Home Access: Stairs to enter Technical brewer of Steps: 3 Entrance Stairs-Rails: [place]: One level     Bathroom Shower/Tub: Tub/shower unit         [place]: Environmental consultant - 2 wheels;Bedside commode          Prior Functioning/Environment Level of Independence: Independent                 OT Problem List: Decreased strength;Pain;Decreased range of motion;Decreased activity tolerance;Decreased safety awareness;Impaired  balance (sitting and/or standing);Decreased knowledge of use of DME or AE;Decreased knowledge of precautions      OT Treatment/Interventions: Self-care/ADL training;Balance training;Therapeutic exercise;Therapeutic activities;Energy conservation;DME and/or AE instruction;Patient/family education;Manual therapy    OT Goals(Current goals can be found in the care plan section) Acute Rehab OT Goals Patient Stated Goal: go home OT Goal Formulation: With patient Time For Goal Achievement: 03/28/20 Potential to Achieve Goals: Fair ADL Goals Pt Will Perform Grooming: with modified independence;standing Pt Will Perform Lower Body Dressing: with modified independence;sit to/from stand Pt Will Transfer to Toilet: with modified independence;ambulating Pt Will Perform Toileting - Clothing Manipulation and hygiene: with modified independence;sit to/from stand  OT Frequency: Min 1X/week   Barriers to D/C:    none know at this time          AM-PAC OT "6 Clicks" Daily Activity     Outcome Measure Help from another person eating meals?: None Help from another person taking care of personal grooming?: None Help from another person toileting, which includes using toliet, bedpan, or urinal?: A Little Help from another person bathing (including washing, rinsing, drying)?: A Little Help from another person to put on and taking off regular upper body clothing?: None Help from another person to put on and taking off regular lower body clothing?: A Little 6 Click Score: 21   End of Session Equipment Utilized During Treatment: Rolling walker Nurse Communication: Mobility status  Activity Tolerance: Patient tolerated treatment well Patient left: in chair;with chair alarm set;with family/visitor present  OT Visit Diagnosis: Unsteadiness on feet (R26.81);Muscle weakness (generalized) (M62.81)                Time: [id number] OT Time Calculation (min): 26 min Charges:  OT General Charges $OT Visit: 1  Visit OT Evaluation $OT Eval Low Complexity: 1 Low OT Treatments $Therapeutic Activity: 8-22 mins  Darleen Crocker, MS, OTR/L , CBIS ascom [phone]  03/14/20, 10:37 AM

## 2020-03-14 NOTE — Progress Notes (Signed)
Physical Therapy Treatment Patient Details Name: Nicholas Thompson MRN: [id number] DOB: [birth year] Today's Date: 03/14/2020    History of Present Illness 64 y/o male s/p L TKA 12/23, h/o L total hip 08/2017.    PT Comments    Improved mobility this pm increasing gait distance to 184ft with RW and SBA for gait sequencing. Pt with 5/10 L knee pain upon completion. Pt educated on AROM exercises and proper positioning of L LE with good understanding.  Sitting AAROM L Knee -5 degrees extension to 90 degrees flexion.  Pt unable to complete active SLR at this time.  Will try and attempt 3 steps with single railing next visit.    Follow Up Recommendations  Home health PT     Equipment Recommendations   (Pt has RW at home)    Recommendations for Other Services       Precautions / Restrictions Precautions Precautions: Fall;Knee Precaution Booklet Issued: Yes (comment)    Mobility  Bed Mobility Overal bed mobility: Needs Assistance         Sit to supine: Min assist   General bed mobility comments: A with L LE  Transfers Overall transfer level: Needs assistance Equipment used: Rolling walker (2 wheeled) Transfers: Sit to/from Stand Sit to Stand: Min guard            Ambulation/Gait Ambulation/Gait assistance: Supervision Gait Distance (Feet): 90 Feet Assistive device: Rolling walker (2 wheeled) Gait Pattern/deviations: Step-to pattern     General Gait Details:  (Verbal cues for increased heel strike and decreased relianceon upper body)   Stairs             Wheelchair Mobility    Modified Rankin (Stroke Patients Only)       Balance                                            Cognition Arousal/Alertness: Awake/alert Behavior During Therapy: WFL for tasks assessed/performed Overall Cognitive Status: Within Functional Limits for tasks assessed                                 General Comments: No c/o nausea during am  session      Exercises Total Joint Exercises Goniometric ROM: -5 - 90    General Comments General comments (skin integrity, edema, etc.): Significant improvement since last session since nausea has been controlled      Pertinent Vitals/Pain Pain Assessment: 0-10 Pain Score: 5  Pain Location: L knee Pain Descriptors / Indicators: Discomfort;Aching;Dull;Other (Comment) Pain Intervention(s): Patient requesting pain meds-RN notified    Home Living                      Prior Function            PT Goals (current goals can now be found in the care plan section) Acute Rehab PT Goals Patient Stated Goal: go home Progress towards PT goals: Progressing toward goals    Frequency    BID      PT Plan Current plan remains appropriate    Co-evaluation              AM-PAC PT "6 Clicks" Mobility   Outcome Measure  Help needed turning from your back to your side while in a flat bed without using bedrails?:  A Little Help needed moving from lying on your back to sitting on the side of a flat bed without using bedrails?: A Little Help needed moving to and from a bed to a chair (including a wheelchair)?: A Lot Help needed standing up from a chair using your arms (e.g., wheelchair or bedside chair)?: A Little Help needed to walk in hospital room?: A Lot Help needed climbing 3-5 steps with a railing? : A Lot 6 Click Score: 15    End of Session Equipment Utilized During Treatment: Gait belt   Patient left: in bed;with call bell/phone within reach Nurse Communication: Mobility status;Patient requests pain meds PT Visit Diagnosis: Muscle weakness (generalized) (M62.81);Difficulty in walking, not elsewhere classified (R26.2);Pain Pain - Right/Left: Left Pain - part of body: Knee     Time: 1425-1510 PT Time Calculation (min) (ACUTE ONLY): 45 min  Charges:  $Gait Training: 23-37 mins $Therapeutic Activity: 8-22 mins                     Zadie Cleverly,  PTA   Jannet Askew 03/14/2020, 4:29 PM

## 2020-03-14 NOTE — Progress Notes (Signed)
   Subjective: 1 Day Post-Op Procedure(s) (LRB): Left total knee arthroplasty - Rachelle Hora to Assist (Left) Patient reports pain as moderate.   Patient was experiencing nausea yesterday, no relief with Zofran.  Scopolamine patch has helped significantly.  No nausea this morning.  Tolerating p.o. well. Denies any CP, SOB, ABD pain. We will continue therapy today.  Plan is to go home   Objective: Vital signs in last 24 hours: Temp:  [97.5 F (36.4 C)-98.6 F (37 C)] 98.6 F (37 C) (12/24 0823) Pulse Rate:  [59-91] 70 (12/24 0823) Resp:  [11-21] 17 (12/24 0823) BP: (113-174)/(57-111) 135/65 (12/24 0823) SpO2:  [90 %-100 %] 100 % (12/24 0823)  Intake/Output from previous day: 12/23 0701 - 12/24 0700 In: 2702.4 [P.O.:100; I.V.:2602.4] Out: 1275 [Urine:975; Blood:300] Intake/Output this shift: No intake/output data recorded.  Recent Labs    03/13/20 0818 03/13/20 0829 03/14/20 0532  HGB 14.6 14.6 12.9*   Recent Labs    03/13/20 0818 03/13/20 0829 03/14/20 0532  WBC 7.2  --  18.7*  RBC 4.55  --  4.08*  HCT 42.4 43.0 37.5*  PLT 287  --  266   Recent Labs    03/13/20 0829 03/14/20 0532  NA 140 137  K 3.6 4.2  CL 103 103  CO2  --  25  BUN 17 19  CREATININE 1.00 0.95  GLUCOSE 97 125*  CALCIUM  --  8.5*   Recent Labs    03/11/20 0933  INR 0.9    EXAM General - Patient is Alert, Appropriate and Oriented Extremity - Neurovascular intact Sensation intact distally Intact pulses distally Dorsiflexion/Plantar flexion intact No cellulitis present Compartment soft Dressing - dressing C/D/I and no drainage Motor Function - intact, moving foot and toes well on exam.   Past Medical History:  Diagnosis Date  . Arthritis   . Basal cell carcinoma    bASAL CELL  . Chronic kidney disease   . GERD (gastroesophageal reflux disease)   . History of colonic polyps   . History of kidney stones   . Hyperlipidemia   . Nephrolithiasis   . Other and unspecified  hyperlipidemia   . PONV (postoperative nausea and vomiting)   . Renal cell carcinoma of right kidney ([place]) 07/30/2015   Nephrectomy  . S/p nephrectomy 07/30/2015  . Unspecified essential hypertension     Assessment/Plan:   1 Day Post-Op Procedure(s) (LRB): Left total knee arthroplasty - Rachelle Hora to Assist (Left) Active Problems:   S/P TKR (total knee replacement) using cement, left  Estimated body mass index is 34.87 kg/m as calculated from the following:   Height as of this encounter: 5' 11.5" (1.816 m).   Weight as of this encounter: 115 kg. Advance diet Up with therapy  Work on bowel movement Vital signs stable Labs are stable Pain well controlled. Postop nausea yesterday, this is resolved with scopolamine.  Tolerating p.o. well.  No complaints of abdominal pain. Care manager to assist with discharge.  Patient prefers to go home with home health PT, PT recommending SNF yesterday.  DVT Prophylaxis - Lovenox, Foot Pumps and TED hose Weight-Bearing as tolerated to left leg   T. Rachelle Hora, PA-C [place] 03/14/2020, 9:23 AM

## 2020-03-14 NOTE — Discharge Summary (Signed)
Physician Discharge Summary  Patient ID: Nicholas Thompson MRN: [id number] DOB/AGE: 65-Jan-1957 65 y.o.  Admit date: 03/13/2020 Discharge date: 03/17/20 Admission Diagnoses:  S/P TKR (total knee replacement) using cement, left [Z96.652]  Discharge Diagnoses: Patient Active Problem List   Diagnosis Date Noted  . S/P TKR (total knee replacement) using cement, left 03/13/2020  . Thoracic aortic aneurysm without rupture ([facility]) 03/20/2018  . Status post total hip replacement, left 08/30/2017  . Renal cell carcinoma of right kidney ([facility]) 07/30/2015  . S/p RIGHT nephrectomy 07/30/2015  . Plant dermatitis 11/07/2012  . COLONIC POLYPS, HX OF 08/07/2008  . HYPERLIPIDEMIA 05/18/2007  . HTN (hypertension) 05/18/2007  . HYPERGLYCEMIA 05/18/2007  . CARCINOMA, BASAL CELL, HX OF 05/18/2007    Past Medical History:  Diagnosis Date  . Arthritis   . Basal cell carcinoma    bASAL CELL  . Chronic kidney disease   . GERD (gastroesophageal reflux disease)   . History of colonic polyps   . History of kidney stones   . Hyperlipidemia   . Nephrolithiasis   . Other and unspecified hyperlipidemia   . PONV (postoperative nausea and vomiting)   . Renal cell carcinoma of right kidney ([facility]) 07/30/2015   Nephrectomy  . S/p nephrectomy 07/30/2015  . Unspecified essential hypertension      Transfusion: none   Consultants (if any):   Discharged Condition: Improved  Hospital Course: Nicholas Thompson is an 64 y.o. male who was admitted 03/13/2020 with a diagnosis of primary osteoarthritis of the left knee and went to the operating room on 03/13/2020 and underwent the above named procedures.    Surgeries: Procedure(s): Left total knee arthroplasty - [place] Neighbor to Assist on 03/13/2020 Patient tolerated the surgery well. Taken to PACU where she was stabilized and then transferred to the orthopedic floor.  Started on Lovenox 30 mg q 12 hrs. Foot pumps applied bilaterally at 80 mm. Heels elevated on bed with  rolled towels. No evidence of DVT. Negative [place]. Physical therapy started on day #1 for gait training and transfer. OT started day #1 for ADL and assisted devices. On postop day 1 patient was experiencing nausea without vomiting.  Scopolamine patches placed which helped to resolve nausea.  On postop day 2, nausea improved.  Patient has progressed slowly with PT but feels comfortable returning home at this time.  Implants: Medacta GMK sphere system with 5+ leftfemur, 4 lefttibia with short stem and 75mm insert. Size 4patella, all components cemented  He was given perioperative antibiotics:  Anti-infectives (From admission, onward)   Start     Dose/Rate Route Frequency Ordered Stop   03/13/20 1600  ceFAZolin (ANCEF) IVPB 2g/100 mL premix        2 g 200 mL/hr over 30 Minutes Intravenous Every 6 hours 03/13/20 1450 03/13/20 2209   03/13/20 0805  ceFAZolin (ANCEF) 2-4 GM/100ML-% IVPB       Note to Pharmacy: Mike Craze   : cabinet override      03/13/20 0805 03/13/20 1021   03/13/20 0800  ceFAZolin (ANCEF) 2 g in dextrose 5 % 100 mL IVPB  Status:  Discontinued        2 g 240 mL/hr over 30 Minutes Intravenous On call to O.R. 03/13/20 6387 03/13/20 0756   03/13/20 0800  ceFAZolin (ANCEF) IVPB 2g/100 mL premix        2 g 200 mL/hr over 30 Minutes Intravenous On call to O.R. 03/13/20 0756 03/13/20 1034    .  He was given  sequential compression devices, early ambulation, and Lovenox, teds for DVT prophylaxis.  He benefited maximally from the hospital stay and there were no complications.    Recent vital signs:  Vitals:   03/17/20 0815 03/17/20 1110  BP: (!) 149/58 (!) 146/66  Pulse: 66 91  Resp: 16 17  Temp: 98.3 F (36.8 C) 98.4 F (36.9 C)  SpO2: 97% 100%    Recent laboratory studies:  Lab Results  Component Value Date   HGB 12.7 (L) 03/15/2020   HGB 12.9 (L) 03/14/2020   HGB 14.6 03/13/2020   Lab Results  Component Value Date   WBC 15.2 (H) 03/15/2020   PLT  265 03/15/2020   Lab Results  Component Value Date   INR 0.9 03/11/2020   Lab Results  Component Value Date   NA 137 03/14/2020   K 4.2 03/14/2020   CL 103 03/14/2020   CO2 25 03/14/2020   BUN 19 03/14/2020   CREATININE 0.95 03/14/2020   GLUCOSE 125 (H) 03/14/2020    Discharge Medications:   Allergies as of 03/17/2020      Reactions   Vicodin [hydrocodone-acetaminophen] Nausea And Vomiting      Medication List    STOP taking these medications   acetaminophen 650 MG CR tablet Commonly known as: TYLENOL Replaced by: acetaminophen 325 MG tablet     TAKE these medications   acetaminophen 325 MG tablet Commonly known as: TYLENOL Take 1-2 tablets (325-650 mg total) by mouth every 6 (six) hours as needed for mild pain (pain score 1-3 or temp > 100.5). Replaces: acetaminophen 650 MG CR tablet   B-12 PO Take 1 tablet by mouth daily.   cetirizine 10 MG tablet Commonly known as: ZYRTEC Take 10 mg by mouth as needed for allergies.   clobetasol cream 0.05 % Commonly known as: TEMOVATE Apply 1 application topically daily as needed (irritation).   DELSYM COUGH/COLD DAYTIME PO Take by mouth as needed (cough).   enalapril 20 MG tablet Commonly known as: VASOTEC Take 2 tablets by mouth once daily   enoxaparin 40 MG/0.4ML injection Commonly known as: Lovenox Inject 0.4 mLs (40 mg total) into the skin daily for 14 days.   hydrochlorothiazide 12.5 MG tablet Commonly known as: HYDRODIURIL Take 1 tablet (12.5 mg total) by mouth daily.   ketoconazole 2 % cream Commonly known as: NIZORAL APPLY 1 APPLICATION TOPICALLY DAILY AS NEEDED FOR IRRITATION What changed: reasons to take this   Melatonin 10 MG Caps Take 10 mg by mouth at bedtime as needed (sleep).   methocarbamol 500 MG tablet Commonly known as: ROBAXIN Take 1 tablet (500 mg total) by mouth every 6 (six) hours as needed for muscle spasms.   omeprazole 20 MG capsule Commonly known as: PRILOSEC Take 1 capsule  by mouth once daily   oxyCODONE 5 MG immediate release tablet Commonly known as: Oxy IR/ROXICODONE Take 1-2 tablets (5-10 mg total) by mouth every 4 (four) hours as needed for moderate pain (pain score 4-6).   oxyCODONE 5 MG immediate release tablet Commonly known as: Roxicodone 1-2 tablets every  6 hours as needed for pain.   scopolamine 1 MG/3DAYS Commonly known as: TRANSDERM-SCOP Place 1 patch (1.5 mg total) onto the skin every 3 (three) days.   traMADol 50 MG tablet Commonly known as: ULTRAM Take 1 tablet (50 mg total) by mouth every 6 (six) hours as needed for moderate pain or severe pain.   VITAMIN D PO Take 1 tablet by mouth daily.  Durable Medical Equipment  (From admission, onward)         Start     Ordered   03/13/20 1451  DME Walker rolling  Once       Question Answer Comment  Walker: With 5 Inch Wheels   Patient needs a walker to treat with the following condition S/P TKR (total knee replacement) using cement, left      03/13/20 1450   03/13/20 1451  DME 3 n 1  Once        03/13/20 1450   03/13/20 1451  DME Bedside commode  Once       Question:  Patient needs a bedside commode to treat with the following condition  Answer:  S/P TKR (total knee replacement) using cement, left   03/13/20 1450          Diagnostic Studies: DG Knee 1-2 Views Left  Result Date: 03/13/2020 CLINICAL DATA:  Pain following total knee replacement EXAM: LEFT KNEE - 1-2 VIEW COMPARISON:  April 25, 2019 FINDINGS: Frontal and lateral views were obtained. Patient is status post total knee replacement on the left with femoral, patellar, and tibial prosthetic components well-seated. No fracture or dislocation. No erosion. There is a spur along the anterior superior patella. There is soft tissue air and skin staples. IMPRESSION: Status post total knee replacement with prosthetic components well-seated. No fracture or dislocation. Acute postoperative changes noted. Spur along  the anterior superior patella may represent distal quadriceps tendinosis. Electronically Signed   By: Lowella Grip III M.D.   On: 03/13/2020 12:59   Disposition: Discharge home with HHPT at this time.   Follow-up Information    Duanne Guess, PA-C Follow up in 2 week(s).   Specialties: Orthopedic Surgery, Emergency Medicine Contact information: [place] [address] [phone]              Signed: Judson Roch PA-C 03/17/2020, 2:40 PM

## 2020-03-14 NOTE — TOC Initial Note (Signed)
Transition of Care [facility]) - Initial/Assessment Note    Patient Details  Name: Nicholas Thompson MRN: [id number] Date of Birth: [birth year]  Transition of Care [facility]) CM/SW Contact:    Shelbie Ammons, RN Phone Number: 03/14/2020, 1:53 PM  Clinical Narrative:   RNCM assessed patient by telephone. Patient is one day status post  Left total knee replacement. Patient reports to feeling much better today and that the nausea has greatly improved which is helping with his mobility. Patient is agreeable to home health services and reports that he has already been contacted by [facility]. He reports that he does not need any equipment, he already has a walker at home.  RNCM verified with Helene Kelp with [facility] that home health is arranged            Expected Discharge Plan: [place] Barriers to Discharge: No Barriers Identified   Patient Goals and CMS Choice        Expected Discharge Plan and Services Expected Discharge Plan: [place] Choice: [place] arrangements for the past 2 months: [place]: PT,OT [place] Agency: [facility] (formerly [employer]) Date [place]: 03/14/20 Time [place]: 1352 Representative spoke with at [place]: [place] Arrangements/Services Living arrangements for the past 2 months: [place] Lives with:: Spouse Patient language and need for interpreter reviewed:: Yes Do you feel safe going back to the place where you live?: Yes      Need for Family Participation in Patient Care: Yes (Comment) Care giver support system in place?: Yes (comment)   Criminal Activity/Legal Involvement Pertinent to Current Situation/Hospitalization: No - Comment as needed  Activities of Daily Living   ADL Screening (condition at time of admission) Patient's cognitive ability adequate to safely complete daily activities?:  Yes Independently performs ADLs?: Yes (appropriate for developmental age)  Permission Sought/Granted                  Emotional Assessment Appearance:: Appears stated age Attitude/Demeanor/Rapport: Engaged Affect (typically observed): Appropriate Orientation: : Oriented to Self,Oriented to Place,Oriented to  Time,Oriented to Situation Alcohol / Substance Use: Not Applicable Psych Involvement: No (comment)  Admission diagnosis:  S/P TKR (total knee replacement) using cement, left [Z96.652] Patient Active Problem List   Diagnosis Date Noted  . S/P TKR (total knee replacement) using cement, left 03/13/2020  . Thoracic aortic aneurysm without rupture ([place]) 03/20/2018  . Status post total hip replacement, left 08/30/2017  . Renal cell carcinoma of right kidney ([place]) 07/30/2015  . S/p RIGHT nephrectomy 07/30/2015  . Plant dermatitis 11/07/2012  . COLONIC POLYPS, HX OF 08/07/2008  . HYPERLIPIDEMIA 05/18/2007  . HTN (hypertension) 05/18/2007  . HYPERGLYCEMIA 05/18/2007  . CARCINOMA, BASAL CELL, HX OF 05/18/2007   PCP:  Owens Loffler, MD Pharmacy:   [facility] [address], [place] - [place] [address] Phone: [phone] Fax: [phone]  [facility] [place] [address] [place] [address] Phone: [phone] Fax: [phone]  [facility] [place], [place] [address] [place] [address] Phone: [phone] Fax:  [phone]     Social Determinants of Health (SDOH) Interventions    Readmission Risk Interventions No flowsheet data found.

## 2020-03-14 NOTE — Progress Notes (Signed)
Physical Therapy Treatment Patient Details Name: Nicholas Thompson MRN: [id number] DOB: 1955-05-02 Today's Date: 03/14/2020    History of Present Illness 64 y/o male s/p L TKA 12/23, h/o L total hip 08/2017.    PT Comments    Increased tolerance for activity this am. Pt able to raise from chair with Min/CGA and vc's for technique. Pt completed gait training with support of RW x 70 feet with light CGA and repeated vc's for gait sequence, increased heel strike, and posture.  No complaints of nausea.  Pt with 5/10 L knee pain during gait with only recently receiving Tylenol for pain.  Pt is very anxious to improve and return home from hospital without requiring SNF placement. Will continue skilled PT services and functional progression in pm.   Follow Up Recommendations  Home health PT     Equipment Recommendations  Rolling walker with 5" wheels    Recommendations for Other Services       Precautions / Restrictions Precautions Precautions: Fall;Knee Precaution Booklet Issued: Yes (comment) Restrictions Weight Bearing Restrictions: Yes LLE Weight Bearing: Weight bearing as tolerated    Mobility  Bed Mobility Overal bed mobility: Needs Assistance Bed Mobility: Supine to Sit;Sit to Supine     Supine to sit: Min assist Sit to supine: Min assist   General bed mobility comments: A with L LE  Transfers Overall transfer level: Needs assistance Equipment used: Rolling walker (2 wheeled) Transfers: Sit to/from Stand Sit to Stand: Min assist            Ambulation/Gait Ambulation/Gait assistance: Min guard Gait Distance (Feet): 70 Feet Assistive device: Rolling walker (2 wheeled) Gait Pattern/deviations: Step-to pattern     General Gait Details:  (Verbal cues for increased heel strike and decreased relianceon upper body)   Stairs             Wheelchair Mobility    Modified Rankin (Stroke Patients Only)       Balance Overall balance assessment: Needs  assistance   Sitting balance-[name]: Good       Standing balance-[name]: Fair                              Cognition Arousal/Alertness: Awake/alert Behavior During Therapy: WFL for tasks assessed/performed Overall Cognitive Status: Within Functional Limits for tasks assessed                                 General Comments: No c/o nausea during am session      Exercises      General Comments General comments (skin integrity, edema, etc.): Significant improvement since last session since nausea has been controlled      Pertinent Vitals/Pain Pain Assessment: 0-10 Pain Score: 5  Pain Location: L knee Pain Descriptors / Indicators: Discomfort;Aching;Dull;Other (Comment) (while ambulating) Pain Intervention(s): Patient requesting pain meds-RN notified    Home Living Family/patient expects to be discharged to:: Private residence Living Arrangements: Spouse/significant other Available Help at Discharge: Family Type of Home: House Home Access: Stairs to enter Entrance Stairs-Rails: Right;Left Home Layout: One level Home Equipment: Environmental consultant - 2 wheels;Bedside commode      Prior Function Level of Independence: Independent          PT Goals (current goals can now be found in the care plan section) Acute Rehab PT Goals Patient Stated Goal: go home Progress towards PT goals:  Progressing toward goals    Frequency    BID      PT Plan Current plan remains appropriate    Co-evaluation              AM-PAC PT "6 Clicks" Mobility   Outcome Measure  Help needed turning from your back to your side while in a flat bed without using bedrails?: A Little Help needed moving from lying on your back to sitting on the side of a flat bed without using bedrails?: A Little Help needed moving to and from a bed to a chair (including a wheelchair)?: A Lot Help needed standing up from a chair using your arms (e.g., wheelchair or bedside  chair)?: A Little Help needed to walk in hospital room?: A Lot Help needed climbing 3-5 steps with a railing? : A Lot 6 Click Score: 15    End of Session Equipment Utilized During Treatment: Gait belt   Patient left: in bed;with call bell/phone within reach Nurse Communication: Mobility status;Patient requests pain meds PT Visit Diagnosis: Muscle weakness (generalized) (M62.81);Difficulty in walking, not elsewhere classified (R26.2);Pain Pain - Right/Left: Left Pain - part of body: Knee     Time: 3329-5188 PT Time Calculation (min) (ACUTE ONLY): 28 min  Charges:  $Gait Training: 23-37 mins                     Mikel Cella, PTA   Josie Dixon 03/14/2020, 1:40 PM

## 2020-03-14 NOTE — Discharge Instructions (Signed)

## 2020-03-15 DIAGNOSIS — Z85528 Personal history of other malignant neoplasm of kidney: Secondary | ICD-10-CM | POA: Diagnosis not present

## 2020-03-15 DIAGNOSIS — Z96642 Presence of left artificial hip joint: Secondary | ICD-10-CM | POA: Diagnosis not present

## 2020-03-15 DIAGNOSIS — M1712 Unilateral primary osteoarthritis, left knee: Secondary | ICD-10-CM | POA: Diagnosis not present

## 2020-03-15 DIAGNOSIS — Z905 Acquired absence of kidney: Secondary | ICD-10-CM | POA: Diagnosis not present

## 2020-03-15 DIAGNOSIS — N189 Chronic kidney disease, unspecified: Secondary | ICD-10-CM | POA: Diagnosis not present

## 2020-03-15 DIAGNOSIS — I129 Hypertensive chronic kidney disease with stage 1 through stage 4 chronic kidney disease, or unspecified chronic kidney disease: Secondary | ICD-10-CM | POA: Diagnosis not present

## 2020-03-15 LAB — CBC
HCT: 36.9 % — ABNORMAL LOW (ref 39.0–52.0)
Hemoglobin: 12.7 g/dL — ABNORMAL LOW (ref 13.0–17.0)
MCH: 31.8 pg (ref 26.0–34.0)
MCHC: 34.4 g/dL (ref 30.0–36.0)
MCV: 92.5 fL (ref 80.0–100.0)
Platelets: 265 10*3/uL (ref 150–400)
RBC: 3.99 MIL/uL — ABNORMAL LOW (ref 4.22–5.81)
RDW: 12.2 % (ref 11.5–15.5)
WBC: 15.2 10*3/uL — ABNORMAL HIGH (ref 4.0–10.5)
nRBC: 0 % (ref 0.0–0.2)

## 2020-03-15 MED ORDER — OXYCODONE HCL 5 MG PO TABS: ORAL_TABLET | ORAL | 0 refills | Status: DC

## 2020-03-15 NOTE — Progress Notes (Signed)
Physical Therapy Treatment Patient Details Name: Nicholas Thompson MRN: [id number] DOB: 10-23-1955 Today's Date: 03/15/2020    History of Present Illness 64 y/o male s/p L TKA 12/23, h/o L total hip 08/2017.    PT Comments    Patient struggled more with lightheadedness today and was unable to ambulate out of the room due to onset of lightheadedness. Reported feeling hot as well. Returned patient to chair and provided cold rags. Vitals WNL. BP assessed after safely sitting (symtpoms improving) and was 115/75 mmHg. Patient struggled with L AAROM knee extension and flexion ROM due to pain and was only able to achieve -10-80 this session. Remains highly motivated to participate. Premedicated before session in attempt to decrease limitations from pain but still groaned, grimaced, and guarded as if extremely painful although reported 4/10. He worked on standing balance and was able to stand at edge of walker while using urinal with CGA from PT and was able to complete some marching in standing with RW. Continues to find it extremely difficult to weight bear on L LE and is unsafe to attempt stairs at this time. If he is not improving tomorrow, may consider changing DC recommendation to SNF for short term rehab. Patient would benefit from continued skilled physical therapy to address remaining impairments and functional limitations to work towards stated goals and return to PLOF or maximal functional independence.      Follow Up Recommendations  Home health PT     Equipment Recommendations   (Pt has RW at home)    Recommendations for Other Services       Precautions / Restrictions Precautions Precautions: Fall;Knee Restrictions LLE Weight Bearing: Weight bearing as tolerated    Mobility  Bed Mobility Overal bed mobility: Needs Assistance Bed Mobility: Supine to Sit     Supine to sit: HOB elevated;Min assist     General bed mobility comments: requared assistance with L LE and trunk. Heavy  use of bed handrails.  Transfers Overall transfer level: Needs assistance Equipment used: Rolling walker (2 wheeled) Transfers: Sit to/from Stand Sit to Stand: Min guard;From elevated surface (bed elevated ~ 4 inches)         General transfer comment: Performed sit <> stand of slightly elevated bed to RW. Very effortful  Ambulation/Gait Ambulation/Gait assistance: Supervision;Min guard Gait Distance (Feet): 20 Feet Assistive device: Rolling walker (2 wheeled) Gait Pattern/deviations: Step-to pattern;Shuffle;Decreased weight shift to left;Antalgic Gait velocity: very slow   General Gait Details: Pateint attempted to ambulate out of room but got lightheaded when approaching door, ambulated backwards towards bed and started to feel good enough to turn and go to the chair when he got to the foot of the bed that PT had removed footer on so he could sit in case of urgent need. HR 97bpm, SpO2 98%. Difficulty wtih weigth bearing on L LE but improved from last session.   Stairs         General stair comments: not safe to attempt this session   Wheelchair Mobility    Modified Rankin (Stroke Patients Only)       Balance Overall balance assessment: Needs assistance   Sitting balance-[name]: Good Sitting balance - Comments: steady sitting at edge of bed, required assistance with trunk to come to sitting from supine.     Standing balance-[name]: Fair Standing balance comment: reliant on walker, able to take hands off to use urinal with CGA support and increased sway  Cognition Arousal/Alertness: Awake/alert Behavior During Therapy: WFL for tasks assessed/performed Overall Cognitive Status: Within Functional Limits for tasks assessed                                 General Comments: patient highly motivated to participate      Exercises Total Joint Exercises Heel Slides: AAROM;10 reps;Seated;Left Long Arc Quad:  AAROM;Left;10 reps;Seated;Strengthening Goniometric ROM: -10 - 80 after several reps of AAROM LAQ and AAROM heel slides. Very painful Marching in Standing: AROM;5 reps;Standing;Both;Strengthening (with RW, each side) Other Exercises Other Exercises: worked on standing tolerance, bed mobility, transfers, ambulation    General Comments        Pertinent Vitals/Pain Pain Assessment: Faces Pain Score: 4  Faces Pain Scale: Hurts whole lot Pain Location: L knee with motion and weight bearing. Reports 2/10 start of PT Pain Descriptors / Indicators: Aching;Grimacing;Guarding Pain Intervention(s): Limited activity within patient's tolerance;Monitored during session;Premedicated before session;Repositioned    Home Living                      Prior Function            PT Goals (current goals can now be found in the care plan section) Acute Rehab PT Goals Patient Stated Goal: go home Time For Goal Achievement: 03/27/20 Potential to Achieve Goals: Good Progress towards PT goals: Not progressing toward goals - comment (was unable to walk as far this session due to light headedness, ROM not as good)    Frequency    BID      PT Plan Current plan remains appropriate    Co-evaluation              AM-PAC PT "6 Clicks" Mobility   Outcome Measure  Help needed turning from your back to your side while in a flat bed without using bedrails?: A Little Help needed moving from lying on your back to sitting on the side of a flat bed without using bedrails?: A Little Help needed moving to and from a bed to a chair (including a wheelchair)?: A Little Help needed standing up from a chair using your arms (e.g., wheelchair or bedside chair)?: A Little Help needed to walk in hospital room?: A Little Help needed climbing 3-5 steps with a railing? : Total 6 Click Score: 16    End of Session Equipment Utilized During Treatment: Gait belt Activity Tolerance: Patient limited by  pain;Other (comment) (limited by lightheadedness) Patient left: with call bell/phone within reach;with chair alarm set;in chair Nurse Communication: Mobility status;Other (comment) (lightheadedness) PT Visit Diagnosis: Muscle weakness (generalized) (M62.81);Difficulty in walking, not elsewhere classified (R26.2);Pain Pain - Right/Left: Left Pain - part of body: Knee     Time: 0981-1914 PT Time Calculation (min) (ACUTE ONLY): 30 min  Charges:  $Gait Training: 8-22 mins $Therapeutic Activity: 8-22 mins                     Everlean Alstrom. Graylon Good, PT, DPT 03/15/20, 4:41 PM

## 2020-03-15 NOTE — Plan of Care (Signed)
°  Problem: Education: Goal: Knowledge of General Education information will improve Description: Including pain rating scale, medication(s)/side effects and non-pharmacologic comfort measures 03/15/2020 1411 by Cristela Blue, RN Outcome: Progressing 03/15/2020 1411 by Cristela Blue, RN Outcome: Progressing   Problem: Health Behavior/Discharge Planning: Goal: Ability to manage health-related needs will improve 03/15/2020 1411 by Cristela Blue, RN Outcome: Progressing 03/15/2020 1411 by Cristela Blue, RN Outcome: Progressing   Problem: Clinical Measurements: Goal: Ability to maintain clinical measurements within normal limits will improve 03/15/2020 1411 by Cristela Blue, RN Outcome: Progressing 03/15/2020 1411 by Cristela Blue, RN Outcome: Progressing Goal: Will remain free from infection 03/15/2020 1411 by Cristela Blue, RN Outcome: Progressing 03/15/2020 1411 by Cristela Blue, RN Outcome: Progressing Goal: Diagnostic test results will improve 03/15/2020 1411 by Cristela Blue, RN Outcome: Progressing 03/15/2020 1411 by Cristela Blue, RN Outcome: Progressing Goal: Respiratory complications will improve 03/15/2020 1411 by Cristela Blue, RN Outcome: Progressing 03/15/2020 1411 by Cristela Blue, RN Outcome: Progressing Goal: Cardiovascular complication will be avoided 03/15/2020 1411 by Cristela Blue, RN Outcome: Progressing 03/15/2020 1411 by Cristela Blue, RN Outcome: Progressing   Problem: Activity: Goal: Risk for activity intolerance will decrease 03/15/2020 1411 by Cristela Blue, RN Outcome: Progressing 03/15/2020 1411 by Cristela Blue, RN Outcome: Progressing   Problem: Nutrition: Goal: Adequate nutrition will be maintained 03/15/2020 1411 by Cristela Blue, RN Outcome: Progressing 03/15/2020 1411 by Cristela Blue, RN Outcome: Progressing   Problem: Coping: Goal: Level of anxiety will decrease 03/15/2020 1411 by Cristela Blue, RN Outcome:  Progressing 03/15/2020 1411 by Cristela Blue, RN Outcome: Progressing   Problem: Elimination: Goal: Will not experience complications related to bowel motility 03/15/2020 1411 by Cristela Blue, RN Outcome: Progressing 03/15/2020 1411 by Cristela Blue, RN Outcome: Progressing Goal: Will not experience complications related to urinary retention 03/15/2020 1411 by Cristela Blue, RN Outcome: Progressing 03/15/2020 1411 by Cristela Blue, RN Outcome: Progressing   Problem: Pain Managment: Goal: General experience of comfort will improve 03/15/2020 1411 by Cristela Blue, RN Outcome: Progressing 03/15/2020 1411 by Cristela Blue, RN Outcome: Progressing   Problem: Safety: Goal: Ability to remain free from injury will improve 03/15/2020 1411 by Cristela Blue, RN Outcome: Progressing 03/15/2020 1411 by Cristela Blue, RN Outcome: Progressing   Problem: Skin Integrity: Goal: Risk for impaired skin integrity will decrease 03/15/2020 1411 by Cristela Blue, RN Outcome: Progressing 03/15/2020 1411 by Cristela Blue, RN Outcome: Progressing   Problem: Education: Goal: Knowledge of the prescribed therapeutic regimen will improve 03/15/2020 1411 by Cristela Blue, RN Outcome: Progressing 03/15/2020 1411 by Cristela Blue, RN Outcome: Progressing Goal: Individualized Educational Video(s) 03/15/2020 1411 by Cristela Blue, RN Outcome: Progressing 03/15/2020 1411 by Cristela Blue, RN Outcome: Progressing   Problem: Activity: Goal: Ability to avoid complications of mobility impairment will improve 03/15/2020 1411 by Cristela Blue, RN Outcome: Progressing 03/15/2020 1411 by Cristela Blue, RN Outcome: Progressing Goal: Range of joint motion will improve 03/15/2020 1411 by Cristela Blue, RN Outcome: Progressing 03/15/2020 1411 by Cristela Blue, RN Outcome: Progressing   Problem: Clinical Measurements: Goal: Postoperative complications will be avoided or  minimized 03/15/2020 1411 by Cristela Blue, RN Outcome: Progressing 03/15/2020 1411 by Cristela Blue, RN Outcome: Progressing   Problem: Pain Management: Goal: Pain level will decrease with appropriate interventions 03/15/2020 1411 by Cristela Blue, RN Outcome: Progressing 03/15/2020 1411 by Cristela Blue, RN Outcome: Progressing   Problem: Skin Integrity: Goal: Will show signs of wound healing 03/15/2020 1411 by Cristela Blue, RN Outcome: Progressing 03/15/2020 1411 by Cristela Blue, RN Outcome: Progressing

## 2020-03-15 NOTE — Progress Notes (Addendum)
  Subjective: 2 Days Post-Op Procedure(s) (LRB): Left total knee arthroplasty - Rachelle Hora to Assist (Left) Patient reports pain as moderate.   Patient is well, and has had no acute complaints or problems Patient would prefer to go home after hospital stay but current PT recommendation is SNF. Negative for chest pain and shortness of breath Fever: no Gastrointestinal: negative for nausea and vomiting.   Patient has not had a bowel movement. He has been passing gas.   Objective: Vital signs in last 24 hours: Temp:  [98.2 F (36.8 C)-99.1 F (37.3 C)] 98.3 F (36.8 C) (12/25 0349) Pulse Rate:  [65-77] 75 (12/25 0349) Resp:  [16-17] 16 (12/25 0349) BP: (132-156)/(65-78) 156/65 (12/25 0349) SpO2:  [94 %-96 %] 96 % (12/25 0349)  Intake/Output from previous day:  Intake/Output Summary (Last 24 hours) at 03/15/2020 0955 Last data filed at 03/14/2020 1903 Gross per 24 hour  Intake 679.09 ml  Output 750 ml  Net -70.91 ml    Intake/Output this shift: No intake/output data recorded.  Labs: Recent Labs    03/13/20 0818 03/13/20 0829 03/14/20 0532  HGB 14.6 14.6 12.9*   Recent Labs    03/13/20 0818 03/13/20 0829 03/14/20 0532  WBC 7.2  --  18.7*  RBC 4.55  --  4.08*  HCT 42.4 43.0 37.5*  PLT 287  --  266   Recent Labs    03/13/20 0829 03/14/20 0532  NA 140 137  K 3.6 4.2  CL 103 103  CO2  --  25  BUN 17 19  CREATININE 1.00 0.95  GLUCOSE 97 125*  CALCIUM  --  8.5*   No results for input(s): LABPT, INR in the last 72 hours.   EXAM General - Patient is Alert, Appropriate and Oriented Extremity - Neurovascular intact Dorsiflexion/Plantar flexion intact Compartment soft Dressing/Incision -Prevena in place, no drainage noted in canister Motor Function - intact, moving foot and toes well on exam.  Cardiovascular- Regular rate and rhythm, no murmurs/rubs/gallops Respiratory- Lungs clear to auscultation bilaterally Gastrointestinal- soft, nontender and active  bowel sounds   Assessment/Plan: 2 Days Post-Op Procedure(s) (LRB): Left total knee arthroplasty - Rachelle Hora to Assist (Left) Active Problems:   S/P TKR (total knee replacement) using cement, left  Estimated body mass index is 34.87 kg/m as calculated from the following:   Height as of this encounter: 5' 11.5" (1.816 m).   Weight as of this encounter: 115 kg. Advance diet Up with therapy   Discussed goals for safe D/C to home. Patient is aware that current trajectory would be for SNF placement. He is highly motivated to work in therapy.   DVT Prophylaxis - Lovenox, Ted hose and foot pumps Weight-Bearing as tolerated to left leg  Cassell Smiles, PA-C [facility] Orthopaedic Surgery 03/15/2020, 9:55 AM

## 2020-03-15 NOTE — Progress Notes (Signed)
Physical Therapy Treatment Patient Details Name: Nicholas Thompson MRN: [id number] DOB: 03-17-1956 Today's Date: 03/15/2020    History of Present Illness 64 y/o male s/p L TKA 12/23, h/o L total hip 08/2017.    PT Comments    Patient remains highly motivated to participate in PT so he can return directly home instead of going to short term rehab and gives excellent effort. He states he did not sleep well and his L knee has been very stiff and painful all night.  He appeared to be more limited by pain than at previous sessions and was unable to advance to practicing stairs due to inability to bear enough weight through the L LE to step up with his R LE. Also tended to put his hands on the bars of the walker or non-rail parts of the stair handrails. Session focused on mobility training including improved weight bearing tolerance on the L LE to improve ability to complete stairs. He was able to ambulate 105 feet with RW and CGA this session and needed mod A for bed mobility and min A for sit <> stand transfer to RW. Patient would benefit from continued skilled physical therapy to address remaining impairments and functional limitations to work towards stated goals and return to PLOF or maximal functional independence.        Follow Up Recommendations  Home health PT     Equipment Recommendations   (Pt has RW at home)    Recommendations for Other Services       Precautions / Restrictions Precautions Precautions: Fall;Knee Restrictions Weight Bearing Restrictions: Yes LLE Weight Bearing: Weight bearing as tolerated    Mobility  Bed Mobility Overal bed mobility: Needs Assistance Bed Mobility: Supine to Sit     Supine to sit: Mod assist;HOB elevated     General bed mobility comments: requared assistance with L LE and trunk. Heavy use of bed handrails.  Transfers Overall transfer level: Needs assistance Equipment used: Rolling walker (2 wheeled) Transfers: Sit to/from Stand Sit to  Stand: Min assist         General transfer comment: Patient performed sit <> stand with great difficulty and several attempts. Required min A and cuing for hand placement. Patient wanted to hold the walker by the bars and had difficulty extending R LE to stand up. Put minimal weight on L LE as knee lacked flexion tolerance due to pain.  Ambulation/Gait Ambulation/Gait assistance: Supervision;Min guard Gait Distance (Feet): 105 Feet Assistive device: Rolling walker (2 wheeled) Gait Pattern/deviations: Step-to pattern;Shuffle;Decreased weight shift to left;Antalgic Gait velocity: very slow   General Gait Details: Patient with heavy UE support on walker during L stance phase. Encouraged him to practice putting more weight on L LE to prepare for weight shift needed to climb stairs safely. Pateint took a few hop like steps and was focused on putting as much weight on L LE as possible but did not tolerate well and continued with heavy UE support. Pateint visibly sweating from effort.   Stairs Stairs: Yes     Number of Stairs: 0 General stair comments: Attempted stairs, but patient unable to bear enough weight through L LE to safely move R LE to first step even with B handrail support. Continued to work on improving weight bearing on L LE.   Wheelchair Mobility    Modified Rankin (Stroke Patients Only)       Balance Overall balance assessment: Needs assistance   Sitting balance-[name]: Good Sitting balance - Comments: steady  sitting at edge of bed, required assistance with trunk to come to sitting from supine. n     Standing balance-[name]: Fair Standing balance comment: reliant on walker                            Cognition Arousal/Alertness: Awake/alert Behavior During Therapy: WFL for tasks assessed/performed Overall Cognitive Status: Within Functional Limits for tasks assessed                                 General Comments: No c/o  nausea during am session. patient highly motivated to participate      Exercises Total Joint Exercises Quad Sets: 5 reps;AROM;Left;Strengthening;Seated Goniometric ROM: Last reading -5 -90 (03/14/20 pm). Did not measure today due to prolonged time spent with mobility training that was very slow due to pain. Will measure again in PM.    General Comments        Pertinent Vitals/Pain Pain Assessment: Faces Pain Score: 8  Faces Pain Scale: Hurts whole lot Pain Location: L knee with motion and weight bearing. Reports 5-6/10 at rest Pain Descriptors / Indicators: Aching;Grimacing;Guarding Pain Intervention(s): Limited activity within patient's tolerance;Monitored during session;Repositioned;Ice applied (Patient reports he wants to limit his pain meds due to negative past experience. Had then 1 hour prior to session. Nursing aware of pain.)    Home Living                      Prior Function            PT Goals (current goals can now be found in the care plan section) Acute Rehab PT Goals Patient Stated Goal: go home Time For Goal Achievement: 03/27/20 Potential to Achieve Goals: Good Progress towards PT goals: Not progressing toward goals - comment (patient appears limited by pain today and was unable to progress past what he was able to do last session.)    Frequency    BID      PT Plan Current plan remains appropriate    Co-evaluation              AM-PAC PT "6 Clicks" Mobility   Outcome Measure  Help needed turning from your back to your side while in a flat bed without using bedrails?: A Little Help needed moving from lying on your back to sitting on the side of a flat bed without using bedrails?: A Little Help needed moving to and from a bed to a chair (including a wheelchair)?: A Lot Help needed standing up from a chair using your arms (e.g., wheelchair or bedside chair)?: A Little Help needed to walk in hospital room?: A Little Help needed climbing  3-5 steps with a railing? : Total 6 Click Score: 15    End of Session Equipment Utilized During Treatment: Gait belt Activity Tolerance: Patient limited by pain Patient left: with call bell/phone within reach;with chair alarm set;in chair Nurse Communication: Mobility status;Other (comment) (high level of pain) PT Visit Diagnosis: Muscle weakness (generalized) (M62.81);Difficulty in walking, not elsewhere classified (R26.2);Pain Pain - Right/Left: Left Pain - part of body: Knee     Time: [id number] PT Time Calculation (min) (ACUTE ONLY): 47 min  Charges:  $Gait Training: 23-37 mins $Therapeutic Activity: 8-22 mins                     Everlean Alstrom.  Graylon Good, PT, DPT 03/15/20, 10:00 AM

## 2020-03-16 ENCOUNTER — Encounter: Payer: Self-pay | Admitting: Orthopedic Surgery

## 2020-03-16 DIAGNOSIS — I129 Hypertensive chronic kidney disease with stage 1 through stage 4 chronic kidney disease, or unspecified chronic kidney disease: Secondary | ICD-10-CM | POA: Diagnosis not present

## 2020-03-16 DIAGNOSIS — Z85528 Personal history of other malignant neoplasm of kidney: Secondary | ICD-10-CM | POA: Diagnosis not present

## 2020-03-16 DIAGNOSIS — Z96642 Presence of left artificial hip joint: Secondary | ICD-10-CM | POA: Diagnosis not present

## 2020-03-16 DIAGNOSIS — N189 Chronic kidney disease, unspecified: Secondary | ICD-10-CM | POA: Diagnosis not present

## 2020-03-16 DIAGNOSIS — M1712 Unilateral primary osteoarthritis, left knee: Secondary | ICD-10-CM | POA: Diagnosis not present

## 2020-03-16 DIAGNOSIS — Z905 Acquired absence of kidney: Secondary | ICD-10-CM | POA: Diagnosis not present

## 2020-03-16 NOTE — Progress Notes (Signed)
  Subjective: 3 Days Post-Op Procedure(s) (LRB): Left total knee arthroplasty - Rachelle Hora to Assist (Left) Patient reports pain as moderate.   Patient is well, and has had no acute complaints or problems Plan is to go Skilled nursing facility after hospital stay, though there is possibility of d/c home if patient meets therapy goals.  Negative for chest pain and shortness of breath Fever: no Gastrointestinal: negative for nausea and vomiting.   Patient has not had a bowel movement.  Objective: Vital signs in last 24 hours: Temp:  [98.9 F (37.2 C)-100 F (37.8 C)] 98.9 F (37.2 C) (12/26 0753) Pulse Rate:  [72-91] 76 (12/26 0753) Resp:  [17-18] 18 (12/26 0753) BP: (117-151)/(58-76) 147/58 (12/26 0753) SpO2:  [97 %-100 %] 98 % (12/26 0753)  Intake/Output from previous day:  Intake/Output Summary (Last 24 hours) at 03/16/2020 0904 Last data filed at 03/16/2020 0300 Gross per 24 hour  Intake 480 ml  Output 600 ml  Net -120 ml    Intake/Output this shift: No intake/output data recorded.  Labs: Recent Labs    03/14/20 0532 03/15/20 0957  HGB 12.9* 12.7*   Recent Labs    03/14/20 0532 03/15/20 0957  WBC 18.7* 15.2*  RBC 4.08* 3.99*  HCT 37.5* 36.9*  PLT 266 265   Recent Labs    03/14/20 0532  NA 137  K 4.2  CL 103  CO2 25  BUN 19  CREATININE 0.95  GLUCOSE 125*  CALCIUM 8.5*   No results for input(s): LABPT, INR in the last 72 hours.   EXAM General - Patient is Alert, Appropriate and Oriented Extremity - Neurovascular intact Dorsiflexion/Plantar flexion intact Compartment soft Dressing/Incision -Polar Care in place and working. Prevena in place, no drainage in canister Motor Function - intact, moving foot and toes well on exam.  Cardiovascular- Regular rate and rhythm, no murmurs/rubs/gallops Respiratory- Lungs clear to auscultation bilaterally Gastrointestinal- soft, nontender and active bowel sounds   Assessment/Plan: 3 Days Post-Op  Procedure(s) (LRB): Left total knee arthroplasty - Rachelle Hora to Assist (Left) Active Problems:   S/P TKR (total knee replacement) using cement, left  Estimated body mass index is 34.87 kg/m as calculated from the following:   Height as of this encounter: 5' 11.5" (1.816 m).   Weight as of this encounter: 115 kg. Advance diet Up with therapy  Likely will d/c to SNF based on PT progress, but advised patient that we may be able to d/c home if he does well and is able to meet therapy goals. Patient understands.  Continue to work on BM.  DVT Prophylaxis - Lovenox, Ted hose and foot pumps Weight-Bearing as tolerated to left leg  Cassell Smiles, PA-C [facility] Orthopaedic Surgery 03/16/2020, 9:04 AM

## 2020-03-16 NOTE — Progress Notes (Signed)
Physical Therapy Treatment Patient Details Name: Nicholas Thompson MRN: [id number] DOB: 1955-08-09 Today's Date: 03/16/2020    History of Present Illness 64 y/o male s/p L TKA 12/23, h/o L total hip 08/2017.    PT Comments    Pt motivated to participate but remains with significant pain and assist despite being pre-medicated before session.  Participated in exercises as described below. Difficulty completing ex's and needs mod assist for ex's and unable to complete full 10 reps of each (typically 5-7).  He transitions to EOB with mod a x 1 pulling heavily up on my hand.  Once sitting steady.  He tolerated knee flexion poorly with limited ROM today.  Stood x 1 with +1 assist with emphasis on weight shifting left/right as he was putting little to no weight on LE. Unable to step in place.  +2 called and he is able to progress gait slowly out of door with improving gait.   Continued with +1 assist and daughter pushing recliner.  He puts max effort in and visibly fatigued, and painful but is able to walk 130'.  He is asked several times if he needs to sit but he is determined to walk further.  Once sitting and back to room he stated he "over did it"  Stairs deferred as discharge is not planned for today.    Pt voiced he wants to return home vs Rehab.  He continues to have significant pain with mobility and limited strength and ROM LLE.  Pt remains highly motivated but concerns are expressed to pt regarding discharge home.  Wife is home but has mobility issues and is not able to physically assist him.  Will plan on stair training in AM and make decision regarding recommendations then.  Anticipate probable recommendation change to SNF but unsure if pt will agree.  Discussed with PA.   Follow Up Recommendations  Home health PT;Supervision for mobility/OOB Other - see above     Equipment Recommendations  Rolling walker with 5" wheels;3in1 (PT)    Recommendations for Other Services       Precautions /  Restrictions Precautions Precautions: Fall;Knee Restrictions Weight Bearing Restrictions: Yes LLE Weight Bearing: Weight bearing as tolerated    Mobility  Bed Mobility Overal bed mobility: Needs Assistance Bed Mobility: Supine to Sit     Supine to sit: Mod assist;HOB elevated     General bed mobility comments: unable to do unassisted, pulls heavily on my hand to get upright  Transfers Overall transfer level: Needs assistance Equipment used: Rolling walker (2 wheeled) Transfers: Sit to/from Stand Sit to Stand: Min assist;From elevated surface            Ambulation/Gait Ambulation/Gait assistance: Min guard;+2 safety/equipment Gait Distance (Feet): 130 Feet Assistive device: Rolling walker (2 wheeled) Gait Pattern/deviations: Step-to pattern;Shuffle;Decreased weight shift to left;Antalgic Gait velocity: very slow   General Gait Details: initially needed +2 for safety but progressed to +1 with recliner follow for safety.  Puts in max effort but limted by pain and fatigue   Stairs         General stair comments: not safe to attempt this session   Wheelchair Mobility    Modified Rankin (Stroke Patients Only)       Balance Overall balance assessment: Needs assistance Sitting-balance support: Feet supported;Single extremity supported Sitting balance-[name]: Good     Standing balance support: Bilateral upper extremity supported Standing balance-[name]: Fair Standing balance comment: reliant on walker, able to take hands off to use  urinal with CGA support and increased sway                            Cognition Arousal/Alertness: Awake/alert Behavior During Therapy: WFL for tasks assessed/performed Overall Cognitive Status: Within Functional Limits for tasks assessed                                        Exercises Total Joint Exercises Goniometric ROM: 5-70 - very resitant to knee flexion today due to pain Other  Exercises Other Exercises: supine HEP per protocol.  AAROM and had difficulty completing 10 reps - typically 5-7 each ex.    General Comments        Pertinent Vitals/Pain Pain Assessment: Faces Faces Pain Scale: Hurts whole lot Pain Location: with motion and WB Pain Descriptors / Indicators: Aching;Grimacing;Guarding Pain Intervention(s): Limited activity within patient's tolerance;Monitored during session;Premedicated before session;Repositioned;Ice applied    Home Living                      Prior Function            PT Goals (current goals can now be found in the care plan section) Progress towards PT goals: Progressing toward goals    Frequency    BID      PT Plan Current plan remains appropriate;Other (comment)    Co-evaluation              AM-PAC PT "6 Clicks" Mobility   Outcome Measure  Help needed turning from your back to your side while in a flat bed without using bedrails?: A Little Help needed moving from lying on your back to sitting on the side of a flat bed without using bedrails?: A Lot Help needed moving to and from a bed to a chair (including a wheelchair)?: A Lot Help needed standing up from a chair using your arms (e.g., wheelchair or bedside chair)?: A Little Help needed to walk in hospital room?: A Little Help needed climbing 3-5 steps with a railing? : A Lot 6 Click Score: 15    End of Session Equipment Utilized During Treatment: Gait belt Activity Tolerance: Patient limited by fatigue;Patient limited by pain Patient left: in chair;with call bell/phone within reach;with chair alarm set;with family/visitor present Nurse Communication: Mobility status;Other (comment) Pain - Right/Left: Left Pain - part of body: Knee     Time: 0927-1006 PT Time Calculation (min) (ACUTE ONLY): 39 min  Charges:  $Gait Training: 23-37 mins $Therapeutic Exercise: 8-22 mins                    Chesley Noon, PTA 03/16/20, 10:23 AM

## 2020-03-17 DIAGNOSIS — Z905 Acquired absence of kidney: Secondary | ICD-10-CM | POA: Diagnosis not present

## 2020-03-17 DIAGNOSIS — Z96642 Presence of left artificial hip joint: Secondary | ICD-10-CM | POA: Diagnosis not present

## 2020-03-17 DIAGNOSIS — M1712 Unilateral primary osteoarthritis, left knee: Secondary | ICD-10-CM | POA: Diagnosis not present

## 2020-03-17 DIAGNOSIS — Z85528 Personal history of other malignant neoplasm of kidney: Secondary | ICD-10-CM | POA: Diagnosis not present

## 2020-03-17 DIAGNOSIS — N189 Chronic kidney disease, unspecified: Secondary | ICD-10-CM | POA: Diagnosis not present

## 2020-03-17 DIAGNOSIS — I129 Hypertensive chronic kidney disease with stage 1 through stage 4 chronic kidney disease, or unspecified chronic kidney disease: Secondary | ICD-10-CM | POA: Diagnosis not present

## 2020-03-17 MED ORDER — SCOPOLAMINE 1 MG/3DAYS TD PT72: 1.0000 | MEDICATED_PATCH | TRANSDERMAL | 0 refills | Status: DC

## 2020-03-17 MED ORDER — METHOCARBAMOL 500 MG PO TABS
500.0000 mg | ORAL_TABLET | Freq: Four times a day (QID) | ORAL | 0 refills | Status: DC | PRN
Start: 2020-03-17 — End: 2020-05-29

## 2020-03-17 MED ORDER — ENOXAPARIN SODIUM 40 MG/0.4ML ~~LOC~~ SOLN: 40.0000 mg | SUBCUTANEOUS | 0 refills | Status: DC

## 2020-03-17 MED ORDER — LACTULOSE 10 GM/15ML PO SOLN: 20.0000 g | Freq: Once | ORAL | Status: DC

## 2020-03-17 MED ORDER — OXYCODONE HCL 5 MG PO TABS: 5.0000 mg | ORAL_TABLET | ORAL | 0 refills | Status: DC | PRN

## 2020-03-17 NOTE — Progress Notes (Signed)
Physical Therapy Treatment Patient Details Name: Nicholas Thompson MRN: [id number] DOB: [birth year] Today's Date: 03/17/2020    History of Present Illness 64 y/o male s/p L TKA 12/23, h/o L total hip 08/2017.    PT Comments    Pt stood with min a x 1 and is able to complete lap this session with RW and min guard/assist +1.  Recliner follow for safety.    Discussed discharge plan again.  Pt and wife discussed and decided to discharge home. They are both aware of risks of discharge given limited mobility and ROM.  Discussed with Dr. Rudene Christians.  Pt given gait belt and encouraged +1 assist at all times.  Taught self stretching and ROM techniques.   Follow Up Recommendations  SNF;Other (comment)     Equipment Recommendations  Rolling walker with 5" wheels;3in1 (PT)    Recommendations for Other Services       Precautions / Restrictions Precautions Precautions: Fall;Knee Restrictions Weight Bearing Restrictions: Yes LLE Weight Bearing: Weight bearing as tolerated    Mobility  Bed Mobility Overal bed mobility: Needs Assistance Bed Mobility: Supine to Sit     Supine to sit: Mod assist        Transfers Overall transfer level: Needs assistance Equipment used: Rolling walker (2 wheeled) Transfers: Sit to/from Stand Sit to Stand: Min assist         General transfer comment: "Pick me up"  encouraged to do on his own to simulate home but needs min a x 2 from lower height bed as at home  Ambulation/Gait Ambulation/Gait assistance: Min guard Gait Distance (Feet): 160 Feet Assistive device: Rolling walker (2 wheeled) Gait Pattern/deviations: Step-to pattern;Shuffle;Decreased weight shift to left;Antalgic Gait velocity: decreased   General Gait Details: cues for walker placement and safety   Stairs Stairs: Yes Stairs assistance: Min assist;+2 physical assistance Stair Management: One rail Left;Two rails;Step to pattern Number of Stairs: 3 General stair comments: very hesitant  but pushes though due to motivation to go home, no bucklign but very cumbersome and voices fear.   Wheelchair Mobility    Modified Rankin (Stroke Patients Only)       Balance Overall balance assessment: Needs assistance Sitting-balance support: Feet supported;Single extremity supported Sitting balance-[name]: Good     Standing balance support: Bilateral upper extremity supported Standing balance-[name]: Fair Standing balance comment: reliant on walker, able to take hands off to use urinal with CGA support and increased sway                            Cognition Arousal/Alertness: Awake/alert Behavior During Therapy: WFL for tasks assessed/performed Overall Cognitive Status: Within Functional Limits for tasks assessed                                        Exercises Total Joint Exercises Ankle Circles/Pumps: AROM;10 reps Quad Sets: Strengthening;10 reps Heel Slides: PROM;AAROM;5 reps Hip ABduction/ADduction: AROM;10 reps Straight Leg Raises: PROM;AAROM;5 reps Knee Flexion: PROM;5 reps Goniometric ROM: 5-70 - limited by pain Other Exercises Other Exercises: stretching and ROM in sitting.  taught self stretch techniques    General Comments        Pertinent Vitals/Pain Pain Assessment: Faces Faces Pain Scale: Hurts little more Pain Location: generally comfortable at rest, increases with mobility Pain Descriptors / Indicators: Aching;Grimacing;Guarding Pain Intervention(s): Limited activity within patient's tolerance;Monitored during  session;Repositioned;Ice applied    Home Living                      Prior Function            PT Goals (current goals can now be found in the care plan section) Progress towards PT goals: Progressing toward goals    Frequency    BID      PT Plan Current plan remains appropriate;Other (comment)    Co-evaluation              AM-PAC PT "6 Clicks" Mobility   Outcome  Measure  Help needed turning from your back to your side while in a flat bed without using bedrails?: A Little Help needed moving from lying on your back to sitting on the side of a flat bed without using bedrails?: A Lot Help needed moving to and from a bed to a chair (including a wheelchair)?: A Little Help needed standing up from a chair using your arms (e.g., wheelchair or bedside chair)?: A Little Help needed to walk in hospital room?: A Little Help needed climbing 3-5 steps with a railing? : A Lot 6 Click Score: 16    End of Session Equipment Utilized During Treatment: Gait belt Activity Tolerance: Patient limited by fatigue;Patient tolerated treatment well Patient left: in chair;with call bell/phone within reach;with chair alarm set;with family/visitor present Nurse Communication: Mobility status;Other (comment) Pain - Right/Left: Left Pain - part of body: Knee     Time: [id number] PT Time Calculation (min) (ACUTE ONLY): 26 min  Charges:  $Gait Training: 23-37 mins $Therapeutic Exercise: 8-22 mins                    Chesley Noon, PTA 03/17/20, 1:30 PM

## 2020-03-17 NOTE — TOC Progression Note (Signed)
Transition of Care [facility]) - Progression Note    Patient Details  Name: Nicholas Thompson MRN: [id number] Date of Birth: Sep 30, 1955  Transition of [facility]) CM/SW [place], LCSW Phone Number: 03/17/2020, 1:20 PM  Clinical Narrative:   Spoke with patient who confirmed he will have [facility] at home and has a RW already. Explained recommendation for 3 in 1. Patient reported he does not think he needs a 3 in 1 but will let staff know if he changes his mind. No other needs identified at this time.    Expected Discharge Plan: [place] Barriers to Discharge: No Barriers Identified  Expected Discharge Plan and Services Expected Discharge Plan: [place] Choice: [place] arrangements for the past 2 months: [place]: PT,OT [place] Agency: [facility] [employer] Date [place]: 03/14/20 Time [place]: 1352 Representative spoke with at [place]: [place] ([place]) Interventions    Readmission Risk Interventions No flowsheet data found.

## 2020-03-17 NOTE — Progress Notes (Signed)
Subjective: 4 Days Post-Op Procedure(s) (LRB): Left total knee arthroplasty - Rachelle Hora to Assist (Left) Patient reports pain as mild at the moment. Patient is well, and has had no acute complaints or problems Plan is to go home, patient has had discussion with family and even though progress with PT has been slow he feels comfortable going home. Negative for chest pain and shortness of breath Fever: no Gastrointestinal: negative for nausea and vomiting.   Patient has not had a bowel movement.  Reports that he is passing gas.  Objective: Vital signs in last 24 hours: Temp:  [98.3 F (36.8 C)-99.4 F (37.4 C)] 98.4 F (36.9 C) (12/27 1110) Pulse Rate:  [66-91] 91 (12/27 1110) Resp:  [15-18] 17 (12/27 1110) BP: (110-149)/(58-68) 146/66 (12/27 1110) SpO2:  [97 %-100 %] 100 % (12/27 1110)  Intake/Output from previous day:  Intake/Output Summary (Last 24 hours) at 03/17/2020 1437 Last data filed at 03/17/2020 1424 Gross per 24 hour  Intake 600 ml  Output 1100 ml  Net -500 ml    Intake/Output this shift: Total I/O In: 600 [P.O.:600] Out: 150 [Urine:150]  Labs: Recent Labs    03/15/20 0957  HGB 12.7*   Recent Labs    03/15/20 0957  WBC 15.2*  RBC 3.99*  HCT 36.9*  PLT 265   No results for input(s): NA, K, CL, CO2, BUN, CREATININE, GLUCOSE, CALCIUM in the last 72 hours. No results for input(s): LABPT, INR in the last 72 hours.   EXAM General - Patient is Alert, Appropriate and Oriented Extremity - Neurovascular intact Dorsiflexion/Plantar flexion intact Compartment soft Dressing/Incision -Polar Care in place and working. Prevena in place, no drainage in canister Motor Function - intact, moving foot and toes well on exam.  Cardiovascular- Regular rate and rhythm, no murmurs/rubs/gallops Respiratory- Lungs clear to auscultation bilaterally Gastrointestinal- soft, nontender and active bowel sounds  Assessment/Plan: 4 Days Post-Op Procedure(s) (LRB): Left  total knee arthroplasty - Rachelle Hora to Assist (Left) Active Problems:   S/P TKR (total knee replacement) using cement, left  Estimated body mass index is 34.87 kg/m as calculated from the following:   Height as of this encounter: 5' 11.5" (1.816 m).   Weight as of this encounter: 115 kg. Advance diet Up with therapy  PT still recommending SNF however patient states that he cannot afford rehab at this time.  He feels that he has good support at home and feels that he will be safe at home. Patient is passing gas without pain.  Declines a suppository.  Will add one dose of lactulose prior to discharge. Patient would like to go home, will plan for discharge home this afternoon.  DVT Prophylaxis - Lovenox, Ted hose and foot pumps Weight-Bearing as tolerated to left leg  J. Cameron Proud, PA-C [facility] Orthopaedic Surgery 03/17/2020, 2:37 PM

## 2020-03-17 NOTE — Progress Notes (Signed)
Physical Therapy Treatment Patient Details Name: Nicholas Thompson MRN: [id number] DOB: 02-13-1956 Today's Date: 03/17/2020    History of Present Illness 64 y/o male s/p L TKA 12/23, h/o L total hip 08/2017.    PT Comments    Pt ready for session.  Pain meds prior to session.  Generally comfortable at rest but increases with mobility.  8/10.  Participated in exercises as described below.  Pt continues to need mod a for exercises and knee flexion remains limited and very painful.  Not progressing towards mobility as anticipated.  He is given time and cues to progress to EOB but needs mod a x 1 to complete task for LE and for trunk.  Sitting is steady but continues to favor LLE and leans to right.  Bed positioned to height to simulate bed at home.  He is unable to stand unassisted and needs min a x 2 from lower height "Pick me up."  When asked who would assist at home he stated daughter, wife and another male.  He is able to walk slowly to rehab gym for stair training.  +3 assist for safety and support.  He is able to step x 2 with bilateral rails and x 1 with left rail to simulate home as he cannot reach both.  Comes downs with L rail only.  He is hesitant but his desire to go home vs rehab is helpful and he is able to go up/down with encouragement but voices fear each step "Don't let me fall."  He does report some dizziness on coming down given level of effort he was putting in.  Beads of sweat noted on head and chest.  Stated he felt fine and HR and O2 normal.    Discussed at length discharge plan with pt and wife.  Pt continues to require significant assist for mobility and has limited ROM and strength LLE.  He is not progressing with strength and ROM as expected.  Pt and wife agree to bed search from SNF but voice concerns regarding covered days and are under the impression that he will not be covered in the new year.  Discussed with TOC.  Pt and wife left to discuss after frank discussion over my concerns  for mobility and L knee outcome without daily PT session.  He continues to state he thinks he will be ok at home.  Will leave them to discuss this am and return later for continued gait as he was too fatigued from stairs to safely progress ambulation.     Follow Up Recommendations  SNF     Equipment Recommendations  Rolling walker with 5" wheels;3in1 (PT)    Recommendations for Other Services       Precautions / Restrictions Precautions Precautions: Fall;Knee Restrictions Weight Bearing Restrictions: Yes LLE Weight Bearing: Weight bearing as tolerated    Mobility  Bed Mobility Overal bed mobility: Needs Assistance Bed Mobility: Supine to Sit     Supine to sit: Mod assist        Transfers Overall transfer level: Needs assistance Equipment used: Rolling walker (2 wheeled) Transfers: Sit to/from Stand Sit to Stand: Min assist;+2 physical assistance         General transfer comment: "Pick me up"  encouraged to do on his own to simulate home but needs min a x 2 from lower height bed as at home  Ambulation/Gait Ambulation/Gait assistance: Min guard Gait Distance (Feet): 50 Feet Assistive device: Rolling walker (2 wheeled) Gait Pattern/deviations: Step-to pattern;Shuffle;Decreased  weight shift to left;Antalgic Gait velocity: very slow       Stairs Stairs: Yes Stairs assistance: Min assist;+2 physical assistance Stair Management: One rail Left;Two rails;Step to pattern Number of Stairs: 3 General stair comments: very hesitant but pushes though due to motivation to go home, no bucklign but very cumbersome and voices fear.   Wheelchair Mobility    Modified Rankin (Stroke Patients Only)       Balance Overall balance assessment: Needs assistance Sitting-balance support: Feet supported;Single extremity supported Sitting balance-[name]: Good     Standing balance support: Bilateral upper extremity supported Standing balance-[name]: Fair Standing  balance comment: reliant on walker, able to take hands off to use urinal with CGA support and increased sway                            Cognition Arousal/Alertness: Awake/alert Behavior During Therapy: WFL for tasks assessed/performed Overall Cognitive Status: Within Functional Limits for tasks assessed                                        Exercises Total Joint Exercises Ankle Circles/Pumps: AROM;10 reps Quad Sets: Strengthening;10 reps Heel Slides: PROM;AAROM;5 reps Hip ABduction/ADduction: AROM;10 reps Straight Leg Raises: PROM;AAROM;5 reps Knee Flexion: PROM;5 reps Goniometric ROM: 5-70 - limited by pain    General Comments        Pertinent Vitals/Pain Pain Assessment: Faces Faces Pain Scale: Hurts a little bit Pain Location: generally comfortable at rest, increases with mobility Pain Descriptors / Indicators: Aching;Grimacing;Guarding Pain Intervention(s): Limited activity within patient's tolerance;Monitored during session;Premedicated before session;Repositioned;Ice applied    Home Living                      Prior Function            PT Goals (current goals can now be found in the care plan section) Progress towards PT goals: Progressing toward goals    Frequency    BID      PT Plan Discharge plan needs to be updated;Other (comment)    Co-evaluation              AM-PAC PT "6 Clicks" Mobility   Outcome Measure  Help needed turning from your back to your side while in a flat bed without using bedrails?: A Little Help needed moving from lying on your back to sitting on the side of a flat bed without using bedrails?: A Lot Help needed moving to and from a bed to a chair (including a wheelchair)?: A Lot Help needed standing up from a chair using your arms (e.g., wheelchair or bedside chair)?: A Lot Help needed to walk in hospital room?: A Little Help needed climbing 3-5 steps with a railing? : A Lot 6 Click  Score: 14    End of Session Equipment Utilized During Treatment: Gait belt Activity Tolerance: Patient limited by fatigue;Patient limited by pain Patient left: in chair;with call bell/phone within reach;with chair alarm set;with family/visitor present Nurse Communication: Mobility status;Other (comment) Pain - Right/Left: Left Pain - part of body: Knee     Time: 1000-1041 PT Time Calculation (min) (ACUTE ONLY): 41 min  Charges:  $Gait Training: 23-37 mins $Therapeutic Exercise: 8-22 mins                    Chesley Noon,  PTA 03/17/20, 11:10 AM

## 2020-03-19 DIAGNOSIS — K219 Gastro-esophageal reflux disease without esophagitis: Secondary | ICD-10-CM | POA: Diagnosis not present

## 2020-03-19 DIAGNOSIS — I129 Hypertensive chronic kidney disease with stage 1 through stage 4 chronic kidney disease, or unspecified chronic kidney disease: Secondary | ICD-10-CM | POA: Diagnosis not present

## 2020-03-19 DIAGNOSIS — N189 Chronic kidney disease, unspecified: Secondary | ICD-10-CM | POA: Diagnosis not present

## 2020-03-19 DIAGNOSIS — Z471 Aftercare following joint replacement surgery: Secondary | ICD-10-CM | POA: Diagnosis not present

## 2020-03-19 DIAGNOSIS — Z85828 Personal history of other malignant neoplasm of skin: Secondary | ICD-10-CM | POA: Diagnosis not present

## 2020-03-19 DIAGNOSIS — E785 Hyperlipidemia, unspecified: Secondary | ICD-10-CM | POA: Diagnosis not present

## 2020-03-19 DIAGNOSIS — Z96652 Presence of left artificial knee joint: Secondary | ICD-10-CM | POA: Diagnosis not present

## 2020-03-19 DIAGNOSIS — K635 Polyp of colon: Secondary | ICD-10-CM | POA: Diagnosis not present

## 2020-03-19 DIAGNOSIS — Z7901 Long term (current) use of anticoagulants: Secondary | ICD-10-CM | POA: Diagnosis not present

## 2020-03-19 DIAGNOSIS — I712 Thoracic aortic aneurysm, without rupture: Secondary | ICD-10-CM | POA: Diagnosis not present

## 2020-03-21 DIAGNOSIS — I712 Thoracic aortic aneurysm, without rupture: Secondary | ICD-10-CM | POA: Diagnosis not present

## 2020-03-21 DIAGNOSIS — K635 Polyp of colon: Secondary | ICD-10-CM | POA: Diagnosis not present

## 2020-03-21 DIAGNOSIS — K219 Gastro-esophageal reflux disease without esophagitis: Secondary | ICD-10-CM | POA: Diagnosis not present

## 2020-03-21 DIAGNOSIS — Z85828 Personal history of other malignant neoplasm of skin: Secondary | ICD-10-CM | POA: Diagnosis not present

## 2020-03-21 DIAGNOSIS — Z7901 Long term (current) use of anticoagulants: Secondary | ICD-10-CM | POA: Diagnosis not present

## 2020-03-21 DIAGNOSIS — Z471 Aftercare following joint replacement surgery: Secondary | ICD-10-CM | POA: Diagnosis not present

## 2020-03-21 DIAGNOSIS — I129 Hypertensive chronic kidney disease with stage 1 through stage 4 chronic kidney disease, or unspecified chronic kidney disease: Secondary | ICD-10-CM | POA: Diagnosis not present

## 2020-03-21 DIAGNOSIS — E785 Hyperlipidemia, unspecified: Secondary | ICD-10-CM | POA: Diagnosis not present

## 2020-03-21 DIAGNOSIS — N189 Chronic kidney disease, unspecified: Secondary | ICD-10-CM | POA: Diagnosis not present

## 2020-03-21 DIAGNOSIS — Z96652 Presence of left artificial knee joint: Secondary | ICD-10-CM | POA: Diagnosis not present

## 2020-03-25 DIAGNOSIS — Z7901 Long term (current) use of anticoagulants: Secondary | ICD-10-CM | POA: Diagnosis not present

## 2020-03-25 DIAGNOSIS — N189 Chronic kidney disease, unspecified: Secondary | ICD-10-CM | POA: Diagnosis not present

## 2020-03-25 DIAGNOSIS — Z85828 Personal history of other malignant neoplasm of skin: Secondary | ICD-10-CM | POA: Diagnosis not present

## 2020-03-25 DIAGNOSIS — Z96652 Presence of left artificial knee joint: Secondary | ICD-10-CM | POA: Diagnosis not present

## 2020-03-25 DIAGNOSIS — I712 Thoracic aortic aneurysm, without rupture: Secondary | ICD-10-CM | POA: Diagnosis not present

## 2020-03-25 DIAGNOSIS — K635 Polyp of colon: Secondary | ICD-10-CM | POA: Diagnosis not present

## 2020-03-25 DIAGNOSIS — E785 Hyperlipidemia, unspecified: Secondary | ICD-10-CM | POA: Diagnosis not present

## 2020-03-25 DIAGNOSIS — K219 Gastro-esophageal reflux disease without esophagitis: Secondary | ICD-10-CM | POA: Diagnosis not present

## 2020-03-25 DIAGNOSIS — I129 Hypertensive chronic kidney disease with stage 1 through stage 4 chronic kidney disease, or unspecified chronic kidney disease: Secondary | ICD-10-CM | POA: Diagnosis not present

## 2020-03-25 DIAGNOSIS — Z471 Aftercare following joint replacement surgery: Secondary | ICD-10-CM | POA: Diagnosis not present

## 2020-03-26 DIAGNOSIS — N189 Chronic kidney disease, unspecified: Secondary | ICD-10-CM | POA: Diagnosis not present

## 2020-03-26 DIAGNOSIS — E785 Hyperlipidemia, unspecified: Secondary | ICD-10-CM | POA: Diagnosis not present

## 2020-03-26 DIAGNOSIS — I129 Hypertensive chronic kidney disease with stage 1 through stage 4 chronic kidney disease, or unspecified chronic kidney disease: Secondary | ICD-10-CM | POA: Diagnosis not present

## 2020-03-26 DIAGNOSIS — K219 Gastro-esophageal reflux disease without esophagitis: Secondary | ICD-10-CM | POA: Diagnosis not present

## 2020-03-26 DIAGNOSIS — K635 Polyp of colon: Secondary | ICD-10-CM | POA: Diagnosis not present

## 2020-03-26 DIAGNOSIS — Z85828 Personal history of other malignant neoplasm of skin: Secondary | ICD-10-CM | POA: Diagnosis not present

## 2020-03-26 DIAGNOSIS — Z471 Aftercare following joint replacement surgery: Secondary | ICD-10-CM | POA: Diagnosis not present

## 2020-03-26 DIAGNOSIS — Z96652 Presence of left artificial knee joint: Secondary | ICD-10-CM | POA: Diagnosis not present

## 2020-03-26 DIAGNOSIS — I712 Thoracic aortic aneurysm, without rupture: Secondary | ICD-10-CM | POA: Diagnosis not present

## 2020-03-26 DIAGNOSIS — Z7901 Long term (current) use of anticoagulants: Secondary | ICD-10-CM | POA: Diagnosis not present

## 2020-03-28 DIAGNOSIS — I712 Thoracic aortic aneurysm, without rupture: Secondary | ICD-10-CM | POA: Diagnosis not present

## 2020-03-28 DIAGNOSIS — E785 Hyperlipidemia, unspecified: Secondary | ICD-10-CM | POA: Diagnosis not present

## 2020-03-28 DIAGNOSIS — K219 Gastro-esophageal reflux disease without esophagitis: Secondary | ICD-10-CM | POA: Diagnosis not present

## 2020-03-28 DIAGNOSIS — Z7901 Long term (current) use of anticoagulants: Secondary | ICD-10-CM | POA: Diagnosis not present

## 2020-03-28 DIAGNOSIS — I129 Hypertensive chronic kidney disease with stage 1 through stage 4 chronic kidney disease, or unspecified chronic kidney disease: Secondary | ICD-10-CM | POA: Diagnosis not present

## 2020-03-28 DIAGNOSIS — N189 Chronic kidney disease, unspecified: Secondary | ICD-10-CM | POA: Diagnosis not present

## 2020-03-28 DIAGNOSIS — Z471 Aftercare following joint replacement surgery: Secondary | ICD-10-CM | POA: Diagnosis not present

## 2020-03-28 DIAGNOSIS — Z96652 Presence of left artificial knee joint: Secondary | ICD-10-CM | POA: Diagnosis not present

## 2020-03-28 DIAGNOSIS — Z85828 Personal history of other malignant neoplasm of skin: Secondary | ICD-10-CM | POA: Diagnosis not present

## 2020-03-28 DIAGNOSIS — K635 Polyp of colon: Secondary | ICD-10-CM | POA: Diagnosis not present

## 2020-03-30 ENCOUNTER — Other Ambulatory Visit: Payer: Self-pay | Admitting: Family Medicine

## 2020-03-31 DIAGNOSIS — Z96652 Presence of left artificial knee joint: Secondary | ICD-10-CM | POA: Diagnosis not present

## 2020-03-31 DIAGNOSIS — R262 Difficulty in walking, not elsewhere classified: Secondary | ICD-10-CM | POA: Diagnosis not present

## 2020-03-31 DIAGNOSIS — M25562 Pain in left knee: Secondary | ICD-10-CM | POA: Diagnosis not present

## 2020-04-02 DIAGNOSIS — R262 Difficulty in walking, not elsewhere classified: Secondary | ICD-10-CM | POA: Diagnosis not present

## 2020-04-02 DIAGNOSIS — M25562 Pain in left knee: Secondary | ICD-10-CM | POA: Diagnosis not present

## 2020-04-02 DIAGNOSIS — Z96652 Presence of left artificial knee joint: Secondary | ICD-10-CM | POA: Diagnosis not present

## 2020-04-09 DIAGNOSIS — R262 Difficulty in walking, not elsewhere classified: Secondary | ICD-10-CM | POA: Diagnosis not present

## 2020-04-09 DIAGNOSIS — Z96652 Presence of left artificial knee joint: Secondary | ICD-10-CM | POA: Diagnosis not present

## 2020-04-09 DIAGNOSIS — M25562 Pain in left knee: Secondary | ICD-10-CM | POA: Diagnosis not present

## 2020-04-14 DIAGNOSIS — Z96652 Presence of left artificial knee joint: Secondary | ICD-10-CM | POA: Diagnosis not present

## 2020-04-14 DIAGNOSIS — M25562 Pain in left knee: Secondary | ICD-10-CM | POA: Diagnosis not present

## 2020-04-14 DIAGNOSIS — R262 Difficulty in walking, not elsewhere classified: Secondary | ICD-10-CM | POA: Diagnosis not present

## 2020-04-16 DIAGNOSIS — M25562 Pain in left knee: Secondary | ICD-10-CM | POA: Diagnosis not present

## 2020-04-16 DIAGNOSIS — Z96652 Presence of left artificial knee joint: Secondary | ICD-10-CM | POA: Diagnosis not present

## 2020-04-16 DIAGNOSIS — R262 Difficulty in walking, not elsewhere classified: Secondary | ICD-10-CM | POA: Diagnosis not present

## 2020-04-18 DIAGNOSIS — R262 Difficulty in walking, not elsewhere classified: Secondary | ICD-10-CM | POA: Diagnosis not present

## 2020-04-18 DIAGNOSIS — M25562 Pain in left knee: Secondary | ICD-10-CM | POA: Diagnosis not present

## 2020-04-18 DIAGNOSIS — Z96652 Presence of left artificial knee joint: Secondary | ICD-10-CM | POA: Diagnosis not present

## 2020-04-22 DIAGNOSIS — Z96652 Presence of left artificial knee joint: Secondary | ICD-10-CM | POA: Diagnosis not present

## 2020-04-22 DIAGNOSIS — R262 Difficulty in walking, not elsewhere classified: Secondary | ICD-10-CM | POA: Diagnosis not present

## 2020-04-22 DIAGNOSIS — M25562 Pain in left knee: Secondary | ICD-10-CM | POA: Diagnosis not present

## 2020-04-25 DIAGNOSIS — M1712 Unilateral primary osteoarthritis, left knee: Secondary | ICD-10-CM | POA: Diagnosis not present

## 2020-04-29 DIAGNOSIS — R262 Difficulty in walking, not elsewhere classified: Secondary | ICD-10-CM | POA: Diagnosis not present

## 2020-04-29 DIAGNOSIS — M25562 Pain in left knee: Secondary | ICD-10-CM | POA: Diagnosis not present

## 2020-04-29 DIAGNOSIS — Z96652 Presence of left artificial knee joint: Secondary | ICD-10-CM | POA: Diagnosis not present

## 2020-05-01 DIAGNOSIS — Z96652 Presence of left artificial knee joint: Secondary | ICD-10-CM | POA: Diagnosis not present

## 2020-05-01 DIAGNOSIS — R262 Difficulty in walking, not elsewhere classified: Secondary | ICD-10-CM | POA: Diagnosis not present

## 2020-05-01 DIAGNOSIS — M25562 Pain in left knee: Secondary | ICD-10-CM | POA: Diagnosis not present

## 2020-05-05 ENCOUNTER — Other Ambulatory Visit: Payer: Self-pay | Admitting: Family Medicine

## 2020-05-05 NOTE — Telephone Encounter (Signed)
Spoke with patient stated he will call back to make appt.

## 2020-05-05 NOTE — Telephone Encounter (Signed)
Please try and schedule CPE with fasting labs with Dr. Lorelei Pont.

## 2020-05-06 DIAGNOSIS — Z96652 Presence of left artificial knee joint: Secondary | ICD-10-CM | POA: Diagnosis not present

## 2020-05-06 DIAGNOSIS — M25562 Pain in left knee: Secondary | ICD-10-CM | POA: Diagnosis not present

## 2020-05-06 DIAGNOSIS — R262 Difficulty in walking, not elsewhere classified: Secondary | ICD-10-CM | POA: Diagnosis not present

## 2020-05-08 DIAGNOSIS — M25562 Pain in left knee: Secondary | ICD-10-CM | POA: Diagnosis not present

## 2020-05-08 DIAGNOSIS — R262 Difficulty in walking, not elsewhere classified: Secondary | ICD-10-CM | POA: Diagnosis not present

## 2020-05-08 DIAGNOSIS — Z96652 Presence of left artificial knee joint: Secondary | ICD-10-CM | POA: Diagnosis not present

## 2020-05-13 NOTE — Telephone Encounter (Signed)
Spoke with patient stated he has to check his schedule and call back

## 2020-05-14 DIAGNOSIS — Z96652 Presence of left artificial knee joint: Secondary | ICD-10-CM | POA: Diagnosis not present

## 2020-05-14 DIAGNOSIS — R262 Difficulty in walking, not elsewhere classified: Secondary | ICD-10-CM | POA: Diagnosis not present

## 2020-05-14 DIAGNOSIS — M25562 Pain in left knee: Secondary | ICD-10-CM | POA: Diagnosis not present

## 2020-05-16 DIAGNOSIS — Z96652 Presence of left artificial knee joint: Secondary | ICD-10-CM | POA: Diagnosis not present

## 2020-05-16 DIAGNOSIS — M25562 Pain in left knee: Secondary | ICD-10-CM | POA: Diagnosis not present

## 2020-05-16 DIAGNOSIS — R262 Difficulty in walking, not elsewhere classified: Secondary | ICD-10-CM | POA: Diagnosis not present

## 2020-05-22 DIAGNOSIS — R262 Difficulty in walking, not elsewhere classified: Secondary | ICD-10-CM | POA: Diagnosis not present

## 2020-05-22 DIAGNOSIS — M25562 Pain in left knee: Secondary | ICD-10-CM | POA: Diagnosis not present

## 2020-05-22 DIAGNOSIS — Z96652 Presence of left artificial knee joint: Secondary | ICD-10-CM | POA: Diagnosis not present

## 2020-05-27 DIAGNOSIS — Z96652 Presence of left artificial knee joint: Secondary | ICD-10-CM | POA: Diagnosis not present

## 2020-05-27 DIAGNOSIS — M25562 Pain in left knee: Secondary | ICD-10-CM | POA: Diagnosis not present

## 2020-05-27 DIAGNOSIS — R262 Difficulty in walking, not elsewhere classified: Secondary | ICD-10-CM | POA: Diagnosis not present

## 2020-05-28 ENCOUNTER — Other Ambulatory Visit (INDEPENDENT_AMBULATORY_CARE_PROVIDER_SITE_OTHER): Payer: BC Managed Care – PPO

## 2020-05-28 ENCOUNTER — Other Ambulatory Visit: Payer: Self-pay

## 2020-05-28 DIAGNOSIS — E538 Deficiency of other specified B group vitamins: Secondary | ICD-10-CM

## 2020-05-28 DIAGNOSIS — Z131 Encounter for screening for diabetes mellitus: Secondary | ICD-10-CM | POA: Diagnosis not present

## 2020-05-28 DIAGNOSIS — Z79899 Other long term (current) drug therapy: Secondary | ICD-10-CM | POA: Diagnosis not present

## 2020-05-28 DIAGNOSIS — E785 Hyperlipidemia, unspecified: Secondary | ICD-10-CM | POA: Diagnosis not present

## 2020-05-28 DIAGNOSIS — Z125 Encounter for screening for malignant neoplasm of prostate: Secondary | ICD-10-CM

## 2020-05-28 LAB — CBC WITH DIFFERENTIAL/PLATELET
Basophils Absolute: 0 10*3/uL (ref 0.0–0.1)
Basophils Relative: 0.6 % (ref 0.0–3.0)
Eosinophils Absolute: 0.1 10*3/uL (ref 0.0–0.7)
Eosinophils Relative: 2 % (ref 0.0–5.0)
HCT: 42.8 % (ref 39.0–52.0)
Hemoglobin: 14.7 g/dL (ref 13.0–17.0)
Lymphocytes Relative: 30.1 % (ref 12.0–46.0)
Lymphs Abs: 2.2 10*3/uL (ref 0.7–4.0)
MCHC: 34.3 g/dL (ref 30.0–36.0)
MCV: 90 fl (ref 78.0–100.0)
Monocytes Absolute: 0.6 10*3/uL (ref 0.1–1.0)
Monocytes Relative: 8.7 % (ref 3.0–12.0)
Neutro Abs: 4.3 10*3/uL (ref 1.4–7.7)
Neutrophils Relative %: 58.6 % (ref 43.0–77.0)
Platelets: 292 10*3/uL (ref 150.0–400.0)
RBC: 4.76 Mil/uL (ref 4.22–5.81)
RDW: 13.4 % (ref 11.5–15.5)
WBC: 7.3 10*3/uL (ref 4.0–10.5)

## 2020-05-28 LAB — LDL CHOLESTEROL, DIRECT: Direct LDL: 134 mg/dL

## 2020-05-28 LAB — BASIC METABOLIC PANEL
BUN: 17 mg/dL (ref 6–23)
CO2: 31 mEq/L (ref 19–32)
Calcium: 9.8 mg/dL (ref 8.4–10.5)
Chloride: 102 mEq/L (ref 96–112)
Creatinine, Ser: 1.13 mg/dL (ref 0.40–1.50)
GFR: 68.11 mL/min (ref >60.00)
Glucose, Bld: 111 mg/dL — ABNORMAL HIGH (ref 70–99)
Potassium: 4.6 mEq/L (ref 3.5–5.1)
Sodium: 141 mEq/L (ref 135–145)

## 2020-05-28 LAB — HEPATIC FUNCTION PANEL
ALT: 18 U/L (ref 0–53)
AST: 15 U/L (ref 0–37)
Albumin: 4 g/dL (ref 3.5–5.2)
Alkaline Phosphatase: 81 U/L (ref 39–117)
Bilirubin, Direct: 0.1 mg/dL (ref 0.0–0.3)
Total Bilirubin: 0.4 mg/dL (ref 0.2–1.2)
Total Protein: 6.8 g/dL (ref 6.0–8.3)

## 2020-05-28 LAB — LIPID PANEL
Cholesterol: 222 mg/dL — ABNORMAL HIGH (ref 0–200)
HDL: 47.9 mg/dL (ref >39.00)
NonHDL: 174.35
Total CHOL/HDL Ratio: 5
Triglycerides: 209 mg/dL — ABNORMAL HIGH (ref 0.0–149.0)
VLDL: 41.8 mg/dL — ABNORMAL HIGH (ref 0.0–40.0)

## 2020-05-28 LAB — VITAMIN B12: Vitamin B-12: 1033 pg/mL — ABNORMAL HIGH (ref 211–911)

## 2020-05-28 LAB — HEMOGLOBIN A1C: Hgb A1c MFr Bld: 5.5 % (ref 4.6–6.5)

## 2020-05-29 ENCOUNTER — Encounter: Payer: Self-pay | Admitting: Family Medicine

## 2020-05-29 ENCOUNTER — Ambulatory Visit (INDEPENDENT_AMBULATORY_CARE_PROVIDER_SITE_OTHER): Payer: BC Managed Care – PPO | Admitting: Family Medicine

## 2020-05-29 VITALS — BP 132/86 | HR 71 | Temp 98.5°F | Ht 71.5 in | Wt 252.5 lb

## 2020-05-29 DIAGNOSIS — Z0000 Encounter for general adult medical examination without abnormal findings: Secondary | ICD-10-CM | POA: Diagnosis not present

## 2020-05-29 LAB — PSA, TOTAL WITH REFLEX TO PSA, FREE: PSA, Total: 1.7 ng/mL (ref <=4.0)

## 2020-05-29 MED ORDER — ZOLPIDEM TARTRATE 5 MG PO TABS: 5.0000 mg | ORAL_TABLET | Freq: Every evening | ORAL | 0 refills | Status: DC | PRN

## 2020-05-29 NOTE — Progress Notes (Signed)
Sadia Belfiore T. Meredyth Hornung, MD, [place] at [facility] [place], [address]  Phone: [phone]  FAX: [place] - 65 y.o. male  MRN [id number]  Date of Birth: 09/28/1955  Date: 05/29/2020  PCP: Owens Loffler, MD  Referral: Owens Loffler, MD  Chief Complaint  Patient presents with  . Annual Exam    This visit occurred during the SARS-CoV-2 public health emergency.  Safety protocols were in place, including screening questions prior to the visit, additional usage of staff PPE, and extensive cleaning of exam room while observing appropriate contact time as indicated for disinfecting solutions.   Patient Care Team: Owens Loffler, MD as PCP - General Subjective:   Nicholas Thompson is a 65 y.o. pleasant patient who presents with the following:  Preventative Health Maintenance Visit:  Health Maintenance Summary Reviewed and updated, unless pt declines services.  Tobacco History Reviewed. Alcohol: No concerns, no excessive use Exercise Habits: He is much less active now since his knee replacement in December STD concerns: no risk or activity to increase risk Drug Use: None  He has gained about 20 or 30 pounds since his knee replacement.  Minimally active.  Colon in the fall  Sleep pattern is off If does not go asleep in the first fifteen minutes.   Will get up and watch TV. When he goes to the bathroom, will have a hard time going back to sleep Has tried Melatonin.   2 tabs of benadryl Previously, he never really had any issues with this.  He thinks that this may be because he is less active and his patterns are off.   Health Maintenance  Topic Date Due  . PNA vac Low Risk Adult (1 of 2 - PCV13) 05/29/2021 (Originally 09/14/2019)  . COVID-19 Vaccine (4 - Booster for Pfizer series) 07/17/2020  . COLONOSCOPY (Pts 45-4yr Insurance coverage will need to be  confirmed)  01/26/2021  . TETANUS/TDAP  05/25/2021  . INFLUENZA VACCINE  Completed  . Hepatitis C Screening  Completed  . HIV Screening  Completed  . HPV VACCINES  Aged Out   Immunization History  Administered Date(s) Administered  . Influenza,inj,Quad PF,6+ Mos 01/11/2013, 01/30/2015, 02/27/2016, 05/18/2017, 01/11/2018  . Influenza-Unspecified 11/21/2018, 12/04/2019  . PFIZER(Purple Top)SARS-COV-2 Vaccination 06/23/2019, 07/14/2019, 01/17/2020  . Td 09/12/1998  . Tdap 05/26/2011   Patient Active Problem List   Diagnosis Date Noted  . S/P TKR (total knee replacement) using cement, left 03/13/2020  . Thoracic aortic aneurysm without rupture ([facility] 03/20/2018  . Status post total hip replacement, left 08/30/2017  . Renal cell carcinoma of right kidney ([facility] 07/30/2015  . S/p RIGHT nephrectomy 07/30/2015  . COLONIC POLYPS, HX OF 08/07/2008  . HYPERLIPIDEMIA 05/18/2007  . HTN (hypertension) 05/18/2007  . HYPERGLYCEMIA 05/18/2007  . CARCINOMA, BASAL CELL, HX OF 05/18/2007    Past Medical History:  Diagnosis Date  . Basal cell carcinoma    bASAL CELL  . Chronic kidney disease   . GERD (gastroesophageal reflux disease)   . History of colonic polyps   . Hyperlipidemia   . Nephrolithiasis   . Renal cell carcinoma of right kidney ([facility] 07/30/2015   Nephrectomy  . S/p nephrectomy 07/30/2015  . Unspecified essential hypertension     Past Surgical History:  Procedure Laterality Date  . arthroscopy of knee Right 1989  . CYSTOSCOPY WITH STENT PLACEMENT Left 06/11/2015   Procedure: CYSTOSCOPY WITH STENT  PLACEMENT/ bilateral retrograde pyelogram;  Surgeon: [name], MD;  Location: [facility] ORS;  Service: Urology;  Laterality: Left;  . [place] 2001&2006   3 times  . LAPAROSCOPIC NEPHRECTOMY, HAND ASSISTED Right 07/09/2015   Procedure: HAND ASSISTED LAPAROSCOPIC NEPHRECTOMY;  Surgeon: [name], MD;  Location: [facility] ORS;  Service: Urology;  Laterality:  Right;  . MOHS SURGERY Left 04/2015   [place]  . SHOULDER ARTHROSCOPY W/ SUBACROMIAL DECOMPRESSION AND DISTAL CLAVICLE EXCISION Left 08-27-2007  . TOTAL HIP ARTHROPLASTY Left 08/30/2017   Procedure: TOTAL HIP ARTHROPLASTY ANTERIOR APPROACH;  Surgeon: Hessie Knows, MD;  Location: [facility] ORS;  Service: Orthopedics;  Laterality: Left;  . TOTAL KNEE ARTHROPLASTY Left 03/13/2020   Procedure: Left total knee arthroplasty - Rachelle Hora to Assist;  Surgeon: Hessie Knows, MD;  Location: [facility] ORS;  Service: Orthopedics;  Laterality: Left;  Rachelle Hora to Assist  . URETEROSCOPY WITH HOLMIUM LASER LITHOTRIPSY Bilateral 06/11/2015   Procedure: URETEROSCOPY WITH HOLMIUM LASER LITHOTRIPSY;  Surgeon: [name], MD;  Location: [facility] ORS;  Service: Urology;  Laterality: Bilateral;  . VASECTOMY      Family History  Problem Relation Age of Onset  . Cancer Mother        bladder  . Stroke Mother 63  . Diabetes Father   . Hyperlipidemia Father   . Coronary artery disease Father   . Cancer Paternal Uncle        brain  . Stroke Maternal Grandfather   . Cancer Paternal Grandfather        lung  . Prostate cancer Neg Hx   . Colon cancer Neg Hx     Past Medical History, Surgical History, Social History, Family History, Problem List, Medications, and Allergies have been reviewed and updated if relevant.  Review of Systems: Pertinent positives are listed above.  Otherwise, a full 14 point review of systems has been done in full and it is negative except where it is noted positive.  Objective:   BP 132/86   Pulse 71   Temp 98.5 F (36.9 C) (Temporal)   Ht 5' 11.5" (1.816 m)   Wt 252 lb 8 oz (114.5 kg)   SpO2 98%   BMI 34.73 kg/m  Ideal Body Weight: Weight in (lb) to have BMI = 25: 181.4  Ideal Body Weight: Weight in (lb) to have BMI = 25: 181.4 No exam data present Depression screen [facility] 2/9 05/29/2020 03/28/2019 03/20/2018  Decreased Interest 0 0 0  Down, Depressed, Hopeless 0 0 0  PHQ - 2  Score 0 0 0     GEN: well developed, well nourished, no acute distress Eyes: conjunctiva and lids normal, PERRLA, EOMI ENT: TM clear, nares clear, oral exam WNL Neck: supple, no lymphadenopathy, no thyromegaly, no JVD Pulm: clear to auscultation and percussion, respiratory effort normal CV: regular rate and rhythm, S1-S2, no murmur, rub or gallop, no bruits, peripheral pulses normal and symmetric, no cyanosis, clubbing, edema or varicosities GI: soft, non-tender; no hepatosplenomegaly, masses; active bowel sounds all quadrants GU: deferred Lymph: no cervical, axillary or inguinal adenopathy MSK: gait normal, muscle tone and strength WNL, no joint swelling, effusions, discoloration, crepitus  SKIN: clear, good turgor, color WNL, no rashes, lesions, or ulcerations Neuro: normal mental status, normal strength, sensation, and motion Psych: alert; oriented to person, place and time, normally interactive and not anxious or depressed in appearance.  All labs reviewed with patient. Results for orders placed or performed in visit on 05/28/20  Lipid panel  Result Value Ref Range   Cholesterol 222 (H) 0 - 200 mg/dL   Triglycerides 209.0 (H) 0.0 - 149.0 mg/dL   HDL 47.90 >39.00 mg/dL   VLDL 41.8 (H) 0.0 - 40.0 mg/dL   Total CHOL/HDL Ratio 5    NonHDL 174.35   CBC with Differential/Platelet  Result Value Ref Range   WBC 7.3 4.0 - 10.5 K/uL   RBC 4.76 4.22 - 5.81 Mil/uL   Hemoglobin 14.7 13.0 - 17.0 g/dL   HCT 42.8 39.0 - 52.0 %   MCV 90.0 78.0 - 100.0 fl   MCHC 34.3 30.0 - 36.0 g/dL   RDW 13.4 11.5 - 15.5 %   Platelets 292.0 150.0 - 400.0 K/uL   Neutrophils Relative % 58.6 43.0 - 77.0 %   Lymphocytes Relative 30.1 12.0 - 46.0 %   Monocytes Relative 8.7 3.0 - 12.0 %   Eosinophils Relative 2.0 0.0 - 5.0 %   Basophils Relative 0.6 0.0 - 3.0 %   Neutro Abs 4.3 1.4 - 7.7 K/uL   Lymphs Abs 2.2 0.7 - 4.0 K/uL   Monocytes Absolute 0.6 0.1 - 1.0 K/uL   Eosinophils Absolute 0.1 0.0 - 0.7 K/uL    Basophils Absolute 0.0 0.0 - 0.1 K/uL  Hepatic function panel  Result Value Ref Range   Total Bilirubin 0.4 0.2 - 1.2 mg/dL   Bilirubin, Direct 0.1 0.0 - 0.3 mg/dL   Alkaline Phosphatase 81 39 - 117 U/L   AST 15 0 - 37 U/L   ALT 18 0 - 53 U/L   Total Protein 6.8 6.0 - 8.3 g/dL   Albumin 4.0 3.5 - 5.2 g/dL  Basic metabolic panel  Result Value Ref Range   Sodium 141 135 - 145 mEq/L   Potassium 4.6 3.5 - 5.1 mEq/L   Chloride 102 96 - 112 mEq/L   CO2 31 19 - 32 mEq/L   Glucose, Bld 111 (H) 70 - 99 mg/dL   BUN 17 6 - 23 mg/dL   Creatinine, Ser 1.13 0.40 - 1.50 mg/dL   GFR 68.11 >60.00 mL/min   Calcium 9.8 8.4 - 10.5 mg/dL  Hemoglobin A1c  Result Value Ref Range   Hgb A1c MFr Bld 5.5 4.6 - 6.5 %  Vitamin B12  Result Value Ref Range   Vitamin B-12 1,033 (H) 211 - 911 pg/mL  LDL cholesterol, direct  Result Value Ref Range   Direct LDL 134.0 mg/dL    Assessment and Plan:     ICD-10-CM   1. Healthcare maintenance  Z00.00    Weight gain and sleep  We reviewed sleep hygiene in some detail.  I think it is reasonable to have some Ambien for breakthrough insomnia at this point without plan for long-term use.  He needs to lose weight, and I think that this is the primary issue for virtually all of his issues right now including his cholesterol being elevated.  Health Maintenance Exam: The patient's preventative maintenance and recommended screening tests for an annual wellness exam were reviewed in full today. Brought up to date unless services declined.  Counselled on the importance of diet, exercise, and its role in overall health and mortality. The patient's FH and SH was reviewed, including their home life, tobacco status, and drug and alcohol status.  Follow-up in 1 year for physical exam or additional follow-up below.  Follow-up: No follow-ups on file. Or follow-up in 1 year if not noted.  Meds ordered this encounter  Medications  .  zolpidem (AMBIEN) 5 MG tablet     Sig: Take 1 tablet (5 mg total) by mouth at bedtime as needed for sleep.    Dispense:  30 tablet    Refill:  0   Medications Discontinued During This Encounter  Medication Reason  . enoxaparin (LOVENOX) 40 MG/0.4ML injection Completed Course  . methocarbamol (ROBAXIN) 500 MG tablet Completed Course  . naloxone (NARCAN) nasal spray 4 mg/0.1 mL Completed Course  . scopolamine (TRANSDERM-SCOP) 1 MG/3DAYS Completed Course  . oxyCODONE (OXY IR/ROXICODONE) 5 MG immediate release tablet Completed Course  . oxyCODONE (ROXICODONE) 5 MG immediate release tablet Completed Course  . Phenylephrine-DM-GG-APAP (DELSYM COUGH/COLD DAYTIME PO) Completed Course   No orders of the defined types were placed in this encounter.   Signed,  Maud Deed. Daekwon Beswick, MD   Allergies as of 05/29/2020      Reactions   Vicodin [hydrocodone-acetaminophen] Nausea And Vomiting      Medication List       Accurate as of May 29, 2020 10:15 AM. If you have any questions, ask your nurse or doctor.        STOP taking these medications   DELSYM COUGH/COLD DAYTIME PO Stopped by: Owens Loffler, MD   enoxaparin 40 MG/0.4ML injection Commonly known as: Lovenox Stopped by: Owens Loffler, MD   methocarbamol 500 MG tablet Commonly known as: ROBAXIN Stopped by: Owens Loffler, MD   Narcan 4 MG/0.1ML Liqd nasal spray kit Generic drug: naloxone Stopped by: Owens Loffler, MD   oxyCODONE 5 MG immediate release tablet Commonly known as: Oxy IR/ROXICODONE Stopped by: Owens Loffler, MD   scopolamine 1 MG/3DAYS Commonly known as: TRANSDERM-SCOP Stopped by: Owens Loffler, MD     TAKE these medications   acetaminophen 325 MG tablet Commonly known as: TYLENOL Take 1-2 tablets (325-650 mg total) by mouth every 6 (six) hours as needed for mild pain (pain score 1-3 or temp > 100.5).   B-12 PO Take 1 tablet by mouth daily.   cetirizine 10 MG tablet Commonly known as: ZYRTEC Take 10 mg by mouth as needed  for allergies.   clobetasol cream 0.05 % Commonly known as: TEMOVATE Apply 1 application topically daily as needed (irritation).   cyclobenzaprine 5 MG tablet Commonly known as: FLEXERIL Take by mouth.   enalapril 20 MG tablet Commonly known as: VASOTEC Take 2 tablets by mouth once daily   hydrochlorothiazide 12.5 MG tablet Commonly known as: HYDRODIURIL Take 1 tablet (12.5 mg total) by mouth daily.   ketoconazole 2 % cream Commonly known as: NIZORAL APPLY 1 APPLICATION TOPICALLY DAILY AS NEEDED FOR IRRITATION What changed: reasons to take this   Melatonin 10 MG Caps Take 10 mg by mouth at bedtime as needed (sleep).   omeprazole 20 MG capsule Commonly known as: PRILOSEC Take 1 capsule by mouth once daily   traMADol 50 MG tablet Commonly known as: ULTRAM Take 1 tablet (50 mg total) by mouth every 6 (six) hours as needed for moderate pain or severe pain.   VITAMIN D PO Take 1 tablet by mouth daily.   zolpidem 5 MG tablet Commonly known as: AMBIEN Take 1 tablet (5 mg total) by mouth at bedtime as needed for sleep. Started by: Owens Loffler, MD

## 2020-05-29 NOTE — Patient Instructions (Signed)

## 2020-06-03 DIAGNOSIS — R262 Difficulty in walking, not elsewhere classified: Secondary | ICD-10-CM | POA: Diagnosis not present

## 2020-06-03 DIAGNOSIS — M25562 Pain in left knee: Secondary | ICD-10-CM | POA: Diagnosis not present

## 2020-06-03 DIAGNOSIS — Z96652 Presence of left artificial knee joint: Secondary | ICD-10-CM | POA: Diagnosis not present

## 2020-06-05 DIAGNOSIS — M25562 Pain in left knee: Secondary | ICD-10-CM | POA: Diagnosis not present

## 2020-06-05 DIAGNOSIS — Z96652 Presence of left artificial knee joint: Secondary | ICD-10-CM | POA: Diagnosis not present

## 2020-06-05 DIAGNOSIS — R262 Difficulty in walking, not elsewhere classified: Secondary | ICD-10-CM | POA: Diagnosis not present

## 2020-07-13 ENCOUNTER — Other Ambulatory Visit: Payer: Self-pay | Admitting: Family Medicine

## 2020-07-16 ENCOUNTER — Other Ambulatory Visit: Payer: Self-pay | Admitting: *Deleted

## 2020-07-16 MED ORDER — OMEPRAZOLE 20 MG PO CPDR: 1.0000 | DELAYED_RELEASE_CAPSULE | Freq: Every day | ORAL | 3 refills | Status: DC

## 2020-07-16 MED ORDER — ENALAPRIL MALEATE 20 MG PO TABS: 40.0000 mg | ORAL_TABLET | Freq: Every day | ORAL | 3 refills | Status: DC

## 2020-07-16 MED ORDER — HYDROCHLOROTHIAZIDE 12.5 MG PO TABS: 12.5000 mg | ORAL_TABLET | Freq: Every day | ORAL | 3 refills | Status: DC

## 2020-07-28 DIAGNOSIS — Z96652 Presence of left artificial knee joint: Secondary | ICD-10-CM | POA: Diagnosis not present

## 2020-07-28 DIAGNOSIS — M1712 Unilateral primary osteoarthritis, left knee: Secondary | ICD-10-CM | POA: Diagnosis not present

## 2020-07-28 DIAGNOSIS — Z029 Encounter for administrative examinations, unspecified: Secondary | ICD-10-CM | POA: Diagnosis not present

## 2020-08-20 ENCOUNTER — Other Ambulatory Visit: Payer: Self-pay | Admitting: Family Medicine

## 2020-08-22 NOTE — Telephone Encounter (Signed)
Pharmacy requests refill on: Enalapril 20 mg   LAST REFILL: 07/16/2020 (Q-180, R-3) LAST OV: 05/29/2020 NEXT OV: Not Scheduled  PHARMACY: [facility] [phone] [facility]

## 2020-10-07 ENCOUNTER — Other Ambulatory Visit: Payer: Self-pay | Admitting: Family Medicine

## 2021-02-05 ENCOUNTER — Encounter: Payer: Self-pay | Admitting: Internal Medicine

## 2021-04-15 DIAGNOSIS — C44319 Basal cell carcinoma of skin of other parts of face: Secondary | ICD-10-CM | POA: Diagnosis not present

## 2021-04-15 DIAGNOSIS — D2271 Melanocytic nevi of right lower limb, including hip: Secondary | ICD-10-CM | POA: Diagnosis not present

## 2021-04-15 DIAGNOSIS — C44619 Basal cell carcinoma of skin of left upper limb, including shoulder: Secondary | ICD-10-CM | POA: Diagnosis not present

## 2021-04-15 DIAGNOSIS — D2262 Melanocytic nevi of left upper limb, including shoulder: Secondary | ICD-10-CM | POA: Diagnosis not present

## 2021-04-15 DIAGNOSIS — D2272 Melanocytic nevi of left lower limb, including hip: Secondary | ICD-10-CM | POA: Diagnosis not present

## 2021-04-15 DIAGNOSIS — D225 Melanocytic nevi of trunk: Secondary | ICD-10-CM | POA: Diagnosis not present

## 2021-04-15 DIAGNOSIS — L570 Actinic keratosis: Secondary | ICD-10-CM | POA: Diagnosis not present

## 2021-04-15 DIAGNOSIS — D2261 Melanocytic nevi of right upper limb, including shoulder: Secondary | ICD-10-CM | POA: Diagnosis not present

## 2021-04-15 DIAGNOSIS — D485 Neoplasm of uncertain behavior of skin: Secondary | ICD-10-CM | POA: Diagnosis not present

## 2021-04-17 ENCOUNTER — Encounter: Payer: Self-pay | Admitting: Internal Medicine

## 2021-04-30 DIAGNOSIS — C4491 Basal cell carcinoma of skin, unspecified: Secondary | ICD-10-CM | POA: Diagnosis not present

## 2021-05-12 ENCOUNTER — Ambulatory Visit: Payer: Medicare HMO | Admitting: Internal Medicine

## 2021-05-12 ENCOUNTER — Encounter: Payer: Self-pay | Admitting: Internal Medicine

## 2021-05-12 VITALS — BP 156/92 | HR 86 | Ht 71.5 in | Wt 273.0 lb

## 2021-05-12 DIAGNOSIS — K222 Esophageal obstruction: Secondary | ICD-10-CM

## 2021-05-12 DIAGNOSIS — K5901 Slow transit constipation: Secondary | ICD-10-CM | POA: Diagnosis not present

## 2021-05-12 DIAGNOSIS — K219 Gastro-esophageal reflux disease without esophagitis: Secondary | ICD-10-CM | POA: Diagnosis not present

## 2021-05-12 DIAGNOSIS — Z86010 Personal history of colonic polyps: Secondary | ICD-10-CM

## 2021-05-12 MED ORDER — PLENVU 140 G PO SOLR: 1.0000 | Freq: Once | ORAL | 0 refills | Status: AC

## 2021-05-12 NOTE — Patient Instructions (Signed)
If you are age 67 or older, your body mass index should be between 23-30. Your Body mass index is 37.55 kg/m. If this is out of the aforementioned range listed, please consider follow up with your Primary Care Provider.  If you are age 39 or younger, your body mass index should be between 19-25. Your Body mass index is 37.55 kg/m. If this is out of the aformentioned range listed, please consider follow up with your Primary Care Provider.   ________________________________________________________  The [facility] providers would like to encourage you to use [facility] to communicate with providers for non-urgent requests or questions.  Due to long hold times on the telephone, sending your provider a message by [facility] may be a faster and more efficient way to get a response.  Please allow 48 business hours for a response.  Please remember that this is for non-urgent requests.  _______________________________________________________  Nicholas Thompson have been scheduled for an endoscopy and colonoscopy Please follow written instructions given to you at your visit today. If you use inhalers (even only as needed), please bring them with you on the day of your procedure.

## 2021-05-12 NOTE — Progress Notes (Signed)
HISTORY OF PRESENT ILLNESS:  Nicholas Thompson is a 66 y.o. male with a history of GERD complicated by peptic stricture requiring esophageal dilation, and multiple colon polyps.  Also has a history of renal cell carcinoma status postresection without evidence of recurrent disease.  He presents today regarding recurrent dysphagia and the need for surveillance colonoscopy.  Last upper endoscopy with esophageal dilation November 2019.  54 French Maloney dilator at that time.  He is maintained on omeprazole 20 mg daily for GERD.  He also underwent colonoscopy November 2019.  He was found to have multiple adenomas as well as sessile serrated polyp.  Follow-up in 3 years recommended.  Patient tells me that his intermittent solid food dysphagia has been present for about a year.  His GERD is for the most part controlled with daily PPI.  He has had weight gain.  Some tendency toward sense of incomplete evacuation of his bowel or constipation.  No bleeding.  Otherwise stable.  Last CT scan October 2019.  Reviewed.  Blood work from May 28, 2020 shows normal CBC, normal liver test, normal hemoglobin A1c.  REVIEW OF SYSTEMS:  All non-GI ROS negative unless otherwise stated in the HPI.  Past Medical History:  Diagnosis Date   Basal cell carcinoma    bASAL CELL   Chronic kidney disease    GERD (gastroesophageal reflux disease)    History of colonic polyps    Hyperlipidemia    Nephrolithiasis    Renal cell carcinoma of right kidney ([place]) 07/30/2015   Nephrectomy   S/p nephrectomy 07/30/2015   Unspecified essential hypertension     Past Surgical History:  Procedure Laterality Date   arthroscopy of knee Right 1989   CYSTOSCOPY WITH STENT PLACEMENT Left 06/11/2015   Procedure: CYSTOSCOPY WITH STENT PLACEMENT/ bilateral retrograde pyelogram;  Surgeon: [name], MD;  Location: [facility];  Service: Urology;  Laterality: Left;   ESOPHAGEAL DILATION  1998 & 2001&2006   3 times   LAPAROSCOPIC NEPHRECTOMY,  HAND ASSISTED Right 07/09/2015   Procedure: HAND ASSISTED LAPAROSCOPIC NEPHRECTOMY;  Surgeon: [name], MD;  Location: [facility];  Service: Urology;  Laterality: Right;   MOHS SURGERY Left 04/2015   [facility], [facility]   SHOULDER ARTHROSCOPY W/ SUBACROMIAL DECOMPRESSION AND DISTAL CLAVICLE EXCISION Left 08-27-2007   TOTAL HIP ARTHROPLASTY Left 08/30/2017   Procedure: TOTAL HIP ARTHROPLASTY ANTERIOR APPROACH;  Surgeon: Hessie Knows, MD;  Location: [facility];  Service: Orthopedics;  Laterality: Left;   TOTAL KNEE ARTHROPLASTY Left 03/13/2020   Procedure: Left total knee arthroplasty - Rachelle Hora to Assist;  Surgeon: Hessie Knows, MD;  Location: [facility];  Service: Orthopedics;  Laterality: Left;  Rachelle Hora to Assist   URETEROSCOPY WITH HOLMIUM LASER LITHOTRIPSY Bilateral 06/11/2015   Procedure: URETEROSCOPY WITH HOLMIUM LASER LITHOTRIPSY;  Surgeon: [name], MD;  Location: [facility];  Service: Urology;  Laterality: Bilateral;   VASECTOMY      Social History GEORGIO HATTABAUGH  reports that he has quit smoking. His smoking use included cigars. He has never used smokeless tobacco. He reports current alcohol use. He reports that he does not use drugs.  family history includes Cancer in his mother, paternal grandfather, and paternal uncle; Coronary artery disease in his father; Diabetes in his father; Hyperlipidemia in his father; Stroke in his maternal grandfather; Stroke (age of onset: 84) in his mother.  Allergies  Allergen Reactions   Vicodin [Hydrocodone-Acetaminophen] Nausea And Vomiting       PHYSICAL EXAMINATION: Vital  signs: BP (!) 156/92    Pulse 86    Ht 5' 11.5" (1.816 m)    Wt 273 lb (123.8 kg)    BMI 37.55 kg/m   Constitutional: generally well-appearing, no acute distress Psychiatric: alert and oriented x3, cooperative Eyes: extraocular movements intact, anicteric, conjunctiva pink Mouth: oral pharynx moist, no lesions Neck: supple no lymphadenopathy Cardiovascular: heart  regular rate and rhythm, no murmur Lungs: clear to auscultation bilaterally Abdomen: soft, nontender, nondistended, no obvious ascites, no peritoneal signs, normal bowel sounds, no organomegaly Rectal: Deferred until colonoscopy Extremities: no clubbing, cyanosis, or lower extremity edema bilaterally Skin: no lesions on visible extremities Neuro: No focal deficits.  Cranial nerves intact  ASSESSMENT:  1.  GERD.  On omeprazole 20 mg daily 2.  Recurrent intermittent solid food dysphagia.  Likely secondary to known peptic stricture 3.  Peptic stricture.  EGD with dilation November 2019 4.  History of multiple adenomatous colon polyps and sessile serrated polyp.  Last colonoscopy November 2019.  Due for surveillance 5.  History of renal cell carcinoma status postresection without evidence of recurrence 6.  Constipation  PLAN:  1.  Reflux precautions 2.  Continue PPI 3.  Schedule upper endoscopy with esophageal dilation.The nature of the procedure, as well as the risks, benefits, and alternatives were carefully and thoroughly reviewed with the patient. Ample time for discussion and questions allowed. The patient understood, was satisfied, and agreed to proceed.  4.  Schedule surveillance colonoscopy with polypectomy if indicated.The nature of the procedure, as well as the risks, benefits, and alternatives were carefully and thoroughly reviewed with the patient. Ample time for discussion and questions allowed. The patient understood, was satisfied, and agreed to proceed.  5.  Increase fiber and water.  Recommended Metamucil.  Stool softeners an option as well.

## 2021-06-10 DIAGNOSIS — H2513 Age-related nuclear cataract, bilateral: Secondary | ICD-10-CM | POA: Diagnosis not present

## 2021-06-10 DIAGNOSIS — H532 Diplopia: Secondary | ICD-10-CM | POA: Diagnosis not present

## 2021-06-10 DIAGNOSIS — H5005 Alternating esotropia: Secondary | ICD-10-CM | POA: Diagnosis not present

## 2021-06-18 DIAGNOSIS — Z0100 Encounter for examination of eyes and vision without abnormal findings: Secondary | ICD-10-CM | POA: Diagnosis not present

## 2021-06-24 ENCOUNTER — Encounter: Payer: Self-pay | Admitting: Internal Medicine

## 2021-07-06 ENCOUNTER — Encounter: Payer: Self-pay | Admitting: Internal Medicine

## 2021-07-06 ENCOUNTER — Ambulatory Visit (AMBULATORY_SURGERY_CENTER): Payer: Medicare HMO | Admitting: Internal Medicine

## 2021-07-06 VITALS — BP 131/65 | HR 52 | Temp 98.3°F | Resp 16 | Ht 71.0 in | Wt 273.0 lb

## 2021-07-06 DIAGNOSIS — D120 Benign neoplasm of cecum: Secondary | ICD-10-CM

## 2021-07-06 DIAGNOSIS — K219 Gastro-esophageal reflux disease without esophagitis: Secondary | ICD-10-CM | POA: Diagnosis not present

## 2021-07-06 DIAGNOSIS — I10 Essential (primary) hypertension: Secondary | ICD-10-CM | POA: Diagnosis not present

## 2021-07-06 DIAGNOSIS — R1310 Dysphagia, unspecified: Secondary | ICD-10-CM

## 2021-07-06 DIAGNOSIS — Z860100 Personal history of colon polyps, unspecified: Secondary | ICD-10-CM

## 2021-07-06 DIAGNOSIS — K222 Esophageal obstruction: Secondary | ICD-10-CM | POA: Diagnosis not present

## 2021-07-06 DIAGNOSIS — D123 Benign neoplasm of transverse colon: Secondary | ICD-10-CM

## 2021-07-06 MED ORDER — SODIUM CHLORIDE 0.9 % IV SOLN: 500.0000 mL | Freq: Once | INTRAVENOUS | Status: DC

## 2021-07-06 NOTE — Op Note (Signed)
[place] ?Patient Name: Nicholas Thompson ?Procedure Date: 07/06/2021 1:29 PM ?MRN: [id number] ?Endoscopist: Docia Chuck. Henrene Pastor , MD ?Age: 66 ?Referring MD:  ?Date of Birth: 1955-06-29 ?Gender: Male ?Account #: [id number] ?Procedure:                Upper GI endoscopy with Maloney dilation of the  ?                          esophagus. 45 [place] ?Indications:              Therapeutic procedure, Dysphagia, Esophageal reflux ?Medicines:                Monitored Anesthesia Care ?Procedure:                Pre-Anesthesia Assessment: ?                          - Prior to the procedure, a History and Physical  ?                          was performed, and patient medications and  ?                          allergies were reviewed. The patient's tolerance of  ?                          previous anesthesia was also reviewed. The risks  ?                          and benefits of the procedure and the sedation  ?                          options and risks were discussed with the patient.  ?                          All questions were answered, and informed consent  ?                          was obtained. Prior Anticoagulants: The patient has  ?                          taken no previous anticoagulant or antiplatelet  ?                          agents. ASA Grade Assessment: II - A patient with  ?                          mild systemic disease. After reviewing the risks  ?                          and benefits, the patient was deemed in  ?                          satisfactory condition to undergo the procedure. ?  After obtaining informed consent, the endoscope was  ?                          passed under direct vision. Throughout the  ?                          procedure, the patient's blood pressure, pulse, and  ?                          oxygen saturations were monitored continuously. The  ?                          Endoscope was introduced through the mouth, and  ?                          advanced to  the second part of duodenum. The upper  ?                          GI endoscopy was accomplished without difficulty.  ?                          The patient tolerated the procedure well. ?Scope In: ?Scope Out: ?Findings:                 One benign-appearing, intrinsic moderate stenosis  ?                          was found 38 cm from the incisors. This stenosis  ?                          measured 1.5 cm (inner diameter). The stenosis was  ?                          traversed. After completing the endoscopic survey,  ?                          the scope was withdrawn. Dilation was performed  ?                          with a Maloney dilator with no resistance at 40 Fr. ?                          The esophagus was otherwise normal. ?                          The stomach was normal save hiatal hernia. ?                          The examined duodenum was normal. ?                          The cardia and gastric fundus were normal on  ?  retroflexion. ?Complications:            No immediate complications. ?Estimated Blood Loss:     Estimated blood loss: none. ?Impression:               1. Benign-appearing esophageal stenosis. Dilated. ?                          2. Small hiatal hernia. Otherwise normal EGD ?                          3. GERD. ?Recommendation:           - Patient has a contact number available for  ?                          emergencies. The signs and symptoms of potential  ?                          delayed complications were discussed with the  ?                          patient. Return to normal activities tomorrow.  ?                          Written discharge instructions were provided to the  ?                          patient. ?                          - Post dilation diet. ?                          - Continue present medications. ?                          - GI follow-up as needed ?Docia Chuck. Henrene Pastor, MD ?07/06/2021 2:15:10 PM ?This report has been signed electronically. ?

## 2021-07-06 NOTE — Progress Notes (Signed)
HISTORY OF PRESENT ILLNESS: ?  ?Nicholas Thompson is a 66 y.o. male with a history of GERD complicated by peptic stricture requiring esophageal dilation, and multiple colon polyps.  Also has a history of renal cell carcinoma status postresection without evidence of recurrent disease.  He presents today regarding recurrent dysphagia and the need for surveillance colonoscopy.  Last upper endoscopy with esophageal dilation November 2019.  54 French Maloney dilator at that time.  He is maintained on omeprazole 20 mg daily for GERD.  He also underwent colonoscopy November 2019.  He was found to have multiple adenomas as well as sessile serrated polyp.  Follow-up in 3 years recommended.  Patient tells me that his intermittent solid food dysphagia has been present for about a year.  His GERD is for the most part controlled with daily PPI.  He has had weight gain.  Some tendency toward sense of incomplete evacuation of his bowel or constipation.  No bleeding.  Otherwise stable.  Last CT scan October 2019.  Reviewed.  Blood work from May 28, 2020 shows normal CBC, normal liver test, normal hemoglobin A1c. ?  ?REVIEW OF SYSTEMS: ?  ?All non-GI ROS negative unless otherwise stated in the HPI. ?  ?    ?Past Medical History:  ?Diagnosis Date  ? Basal cell carcinoma    ?  bASAL CELL  ? Chronic kidney disease    ? GERD (gastroesophageal reflux disease)    ? History of colonic polyps    ? Hyperlipidemia    ? Nephrolithiasis    ? Renal cell carcinoma of right kidney ([place]) 07/30/2015  ?  Nephrectomy  ? S/p nephrectomy 07/30/2015  ? Unspecified essential hypertension    ?  ?  ?     ?Past Surgical History:  ?Procedure Laterality Date  ? arthroscopy of knee Right 1989  ? CYSTOSCOPY WITH STENT PLACEMENT Left 06/11/2015  ?  Procedure: CYSTOSCOPY WITH STENT PLACEMENT/ bilateral retrograde pyelogram;  Surgeon: [name], MD;  Location: [facility];  Service: Urology;  Laterality: Left;  ? ESOPHAGEAL DILATION   1998 & 2001&2006  ?  3 times  ?  LAPAROSCOPIC NEPHRECTOMY, HAND ASSISTED Right 07/09/2015  ?  Procedure: HAND ASSISTED LAPAROSCOPIC NEPHRECTOMY;  Surgeon: [name], MD;  Location: [facility];  Service: Urology;  Laterality: Right;  ? MOHS SURGERY Left 04/2015  ?  [facility], [facility]  ? SHOULDER ARTHROSCOPY W/ SUBACROMIAL DECOMPRESSION AND DISTAL CLAVICLE EXCISION Left 08-27-2007  ? TOTAL HIP ARTHROPLASTY Left 08/30/2017  ?  Procedure: TOTAL HIP ARTHROPLASTY ANTERIOR APPROACH;  Surgeon: Hessie Knows, MD;  Location: [facility];  Service: Orthopedics;  Laterality: Left;  ? TOTAL KNEE ARTHROPLASTY Left 03/13/2020  ?  Procedure: Left total knee arthroplasty - Rachelle Hora to Assist;  Surgeon: Hessie Knows, MD;  Location: [facility];  Service: Orthopedics;  Laterality: Left;  Rachelle Hora to Assist  ? URETEROSCOPY WITH HOLMIUM LASER LITHOTRIPSY Bilateral 06/11/2015  ?  Procedure: URETEROSCOPY WITH HOLMIUM LASER LITHOTRIPSY;  Surgeon: [name], MD;  Location: [facility];  Service: Urology;  Laterality: Bilateral;  ? VASECTOMY      ?  ?  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 His smoking use included cigars. He has never used smokeless tobacco. He reports current alcohol use. He reports that he does not use drugs. ?  ?family history includes Cancer in his mother, paternal grandfather, and paternal uncle; Coronary artery disease in his father; Diabetes in his father; Hyperlipidemia  in his father; Stroke in his maternal grandfather; Stroke (age of onset: 3) in his mother. ?  ?    ?Allergies  ?Allergen Reactions  ? Vicodin [Hydrocodone-Acetaminophen] Nausea And Vomiting  ?  ?  ?  ?  ?PHYSICAL EXAMINATION: ?Vital signs: BP (!) 156/92   Pulse 86   Ht 5' 11.5" (1.816 m)   Wt 273 lb (123.8 kg)   BMI 37.55 kg/m?   ?Constitutional: generally well-appearing, no acute distress ?Psychiatric: alert and oriented x3, cooperative ?Eyes: extraocular movements intact, anicteric, conjunctiva pink ?Mouth: oral pharynx moist, no lesions ?Neck:  supple no lymphadenopathy ?Cardiovascular: heart regular rate and rhythm, no murmur ?Lungs: clear to auscultation bilaterally ?Abdomen: soft, nontender, nondistended, no obvious ascites, no peritoneal signs, normal bowel sounds, no organomegaly ?Rectal: Deferred until colonoscopy ?Extremities: no clubbing, cyanosis, or lower extremity edema bilaterally ?Skin: no lesions on visible extremities ?Neuro: No focal deficits.  Cranial nerves intact ?  ?ASSESSMENT: ?  ?1.  GERD.  On omeprazole 20 mg daily ?2.  Recurrent intermittent solid food dysphagia.  Likely secondary to known peptic stricture ?3.  Peptic stricture.  EGD with dilation November 2019 ?4.  History of multiple adenomatous colon polyps and sessile serrated polyp.  Last colonoscopy November 2019.  Due for surveillance ?5.  History of renal cell carcinoma status postresection without evidence of recurrence ?6.  Constipation ?  ?PLAN: ?  ?1.  Reflux precautions ?2.  Continue PPI ?3.  Schedule upper endoscopy with esophageal dilation.The nature of the procedure, as well as the risks, benefits, and alternatives were carefully and thoroughly reviewed with the patient. Ample time for discussion and questions allowed. The patient understood, was satisfied, and agreed to proceed.  ?4.  Schedule surveillance colonoscopy with polypectomy if indicated.The nature of the procedure, as well as the risks, benefits, and alternatives were carefully and thoroughly reviewed with the patient. Ample time for discussion and questions allowed. The patient understood, was satisfied, and agreed to proceed.  ?5.  Increase fiber and water.  Recommended Metamucil.  Stool softeners an option as well. ? ?No interval change in history or physical exam.  Now for colonoscopy and upper endoscopy.  ?

## 2021-07-06 NOTE — Progress Notes (Signed)
Called to room to assist during endoscopic procedure.  Patient ID and intended procedure confirmed with present staff. Received instructions for my participation in the procedure from the performing physician.  

## 2021-07-06 NOTE — Progress Notes (Signed)
Report to PACU, RN, vss, BBS= Clear.  

## 2021-07-06 NOTE — Progress Notes (Signed)
VS-CW  Pt's states no medical or surgical changes since previsit or office visit.  

## 2021-07-06 NOTE — Patient Instructions (Signed)
Please read handouts provided. ?Continue present medications. ?Dilation Diet. ?Await pathology results. ?Repeat colonoscopy in 5 years for screening. ? ? ?YOU HAD AN ENDOSCOPIC PROCEDURE TODAY AT [facility]:   Refer to the procedure report that was given to you for any specific questions about what was found during the examination.  If the procedure report does not answer your questions, please call your gastroenterologist to clarify.  If you requested that your care partner not be given the details of your procedure findings, then the procedure report has been included in a sealed envelope for you to review at your convenience later. ? ?YOU SHOULD EXPECT: Some feelings of bloating in the abdomen. Passage of more gas than usual.  Walking can help get rid of the air that was put into your GI tract during the procedure and reduce the bloating. If you had a lower endoscopy (such as a colonoscopy or flexible sigmoidoscopy) you may notice spotting of blood in your stool or on the toilet paper. If you underwent a bowel prep for your procedure, you may not have a normal bowel movement for a few days. ? ?Please Note:  You might notice some irritation and congestion in your nose or some drainage.  This is from the oxygen used during your procedure.  There is no need for concern and it should clear up in a day or so. ? ?SYMPTOMS TO REPORT IMMEDIATELY: ? ?Following lower endoscopy (colonoscopy or flexible sigmoidoscopy): ? Excessive amounts of blood in the stool ? Significant tenderness or worsening of abdominal pains ? Swelling of the abdomen that is new, acute ? Fever of 100?F or higher ? ?Following upper endoscopy (EGD) ? Vomiting of blood or coffee ground material ? New chest pain or pain under the shoulder blades ? Painful or persistently difficult swallowing ? New shortness of breath ? Fever of 100?F or higher ? Black, tarry-looking stools ? ?For urgent or emergent issues, a gastroenterologist can be  reached at any hour by calling [phone]. ?Do not use MyChart messaging for urgent concerns.  ? ? ?DIET:  Drink plenty of fluids but you should avoid alcoholic beverages for 24 hours. ? ?ACTIVITY:  You should plan to take it easy for the rest of today and you should NOT DRIVE or use heavy machinery until tomorrow (because of the sedation medicines used during the test).   ? ?FOLLOW UP: ?Our staff will call the number listed on your records 48-72 hours following your procedure to check on you and address any questions or concerns that you may have regarding the information given to you following your procedure. If we do not reach you, we will leave a message.  We will attempt to reach you two times.  During this call, we will ask if you have developed any symptoms of COVID 19. If you develop any symptoms (ie: fever, flu-like symptoms, shortness of breath, cough etc.) before then, please call [phone].  If you test positive for Covid 19 in the 2 weeks post procedure, please call and report this information to [place].   ? ?If any biopsies were taken you will be contacted by phone or by letter within the next 1-3 weeks.  Please call us at [phone] if you have not heard about the biopsies in 3 weeks.  ? ? ?SIGNATURES/CONFIDENTIALITY: ?You and/or your care partner have signed paperwork which will be entered into your electronic medical record.  These signatures attest to the fact that that the information above  on your After Visit Summary has been reviewed and is understood.  Full responsibility of the confidentiality of this discharge information lies with you and/or your care-partner.  ?

## 2021-07-06 NOTE — Op Note (Signed)
[place] ?Patient Name: Nicholas Thompson ?Procedure Date: 07/06/2021 1:29 PM ?MRN: [id number] ?Endoscopist: Docia Chuck. Henrene Pastor , MD ?Age: 66 ?Referring MD:  ?Date of Birth: [birth year] ?Gender: Male ?Account #: [id number] ?Procedure:                Colonoscopy with cold snare polypectomy x 2 ?Indications:              High risk colon cancer surveillance: Personal  ?                          history of multiple (3 or more) adenomas, High risk  ?                          colon cancer surveillance: Personal history of  ?                          sessile serrated colon polyp (less than 10 mm in  ?                          size) with no dysplasia. Previous examinations  ?                          2006, 2019 ?Medicines:                Monitored Anesthesia Care ?Procedure:                Pre-Anesthesia Assessment: ?                          - Prior to the procedure, a History and Physical  ?                          was performed, and patient medications and  ?                          allergies were reviewed. The patient's tolerance of  ?                          previous anesthesia was also reviewed. The risks  ?                          and benefits of the procedure and the sedation  ?                          options and risks were discussed with the patient.  ?                          All questions were answered, and informed consent  ?                          was obtained. Prior Anticoagulants: The patient has  ?                          taken no previous anticoagulant or antiplatelet  ?  agents. ASA Grade Assessment: II - A patient with  ?                          mild systemic disease. After reviewing the risks  ?                          and benefits, the patient was deemed in  ?                          satisfactory condition to undergo the procedure. ?                          After obtaining informed consent, the colonoscope  ?                          was passed under direct vision.  Throughout the  ?                          procedure, the patient's blood pressure, pulse, and  ?                          oxygen saturations were monitored continuously. The  ?                          CF [id number] #[id number] was introduced through the anus  ?                          and advanced to the the cecum, identified by  ?                          appendiceal orifice and ileocecal valve. The  ?                          ileocecal valve, appendiceal orifice, and rectum  ?                          were photographed. The quality of the bowel  ?                          preparation was excellent. The colonoscopy was  ?                          performed without difficulty. The patient tolerated  ?                          the procedure well. The bowel preparation used was  ?                          Prepopik via split dose instruction. ?Scope In: 1:42:00 PM ?Scope Out: 1:55:02 PM ?Scope Withdrawal Time: 0 hours 9 minutes 25 seconds  ?Total Procedure Duration: 0 hours 13 minutes 2 seconds  ?Findings:                 Two polyps were found in the transverse colon  and  ?                          cecum. The polyps were 3 to 4 mm in size. These  ?                          polyps were removed with a cold snare. Resection  ?                          and retrieval were complete. ?                          Internal hemorrhoids were found during  ?                          retroflexion. The hemorrhoids were small. ?                          The exam was otherwise without abnormality on  ?                          direct and retroflexion views. ?Complications:            No immediate complications. Estimated blood loss:  ?                          None. ?Estimated Blood Loss:     Estimated blood loss: none. ?Impression:               - Two 3 to 4 mm polyps in the transverse colon and  ?                          in the cecum, removed with a cold snare. Resected  ?                          and retrieved. ?                           - Internal hemorrhoids. ?                          - The examination was otherwise normal on direct  ?                          and retroflexion views. ?Recommendation:           - Repeat colonoscopy in 5 years for surveillance  ?                          (personal history of multiple polyps). ?                          - Patient has a contact number available for  ?                          emergencies. The signs and symptoms of potential  ?  delayed complications were discussed with the  ?                          patient. Return to normal activities tomorrow.  ?                          Written discharge instructions were provided to the  ?                          patient. ?                          - Resume previous diet. ?                          - Continue present medications. ?                          - Await pathology results. ?Docia Chuck. Henrene Pastor, MD ?07/06/2021 2:03:00 PM ?This report has been signed electronically. ?

## 2021-07-08 ENCOUNTER — Telehealth: Payer: Self-pay | Admitting: *Deleted

## 2021-07-08 NOTE — Telephone Encounter (Signed)
?  Follow up Call- ? ? ?  07/06/2021  ? 12:53 PM  ?Call back number  ?Post procedure Call Back phone  # [phone]  ?Permission to leave phone message Yes  ?  ? ?Patient questions: ? ?Do you have a fever, pain , or abdominal swelling? No. ?Pain Score  0 * ? ?Have you tolerated food without any problems? Yes.   ? ?Have you been able to return to your normal activities? Yes.   ? ?Do you have any questions about your discharge instructions: ?Diet   No. ?Medications  No. ?Follow up visit  No. ? ?Do you have questions or concerns about your Care? No. ? ?Actions: ?* If pain score is 4 or above: ?No action needed, pain <4. ? ? ?

## 2021-07-08 NOTE — Telephone Encounter (Signed)
?  Follow up Call- ? ? ?  07/06/2021  ? 12:53 PM  ?Call back number  ?Post procedure Call Back phone  # [phone]  ?Permission to leave phone message Yes  ?  ? ?Patient questions: ?Message left to call if necessary. ?

## 2021-07-09 ENCOUNTER — Encounter: Payer: Self-pay | Admitting: Internal Medicine

## 2021-07-14 ENCOUNTER — Other Ambulatory Visit: Payer: Self-pay | Admitting: Family Medicine

## 2021-07-15 ENCOUNTER — Encounter: Payer: Self-pay | Admitting: *Deleted

## 2021-07-15 NOTE — Telephone Encounter (Signed)
MyChart message sent to Nicholas Thompson asking him to call office to schedule MWV with nurse, CPE and fasting labs prior with Dr. Lorelei Pont.  ?

## 2021-07-29 DIAGNOSIS — L570 Actinic keratosis: Secondary | ICD-10-CM | POA: Diagnosis not present

## 2021-07-29 DIAGNOSIS — C4491 Basal cell carcinoma of skin, unspecified: Secondary | ICD-10-CM | POA: Diagnosis not present

## 2021-08-24 ENCOUNTER — Telehealth: Payer: Self-pay

## 2021-08-24 ENCOUNTER — Other Ambulatory Visit: Payer: Self-pay | Admitting: Family Medicine

## 2021-08-24 ENCOUNTER — Other Ambulatory Visit: Payer: Medicare HMO

## 2021-08-24 DIAGNOSIS — E785 Hyperlipidemia, unspecified: Secondary | ICD-10-CM

## 2021-08-24 DIAGNOSIS — R7309 Other abnormal glucose: Secondary | ICD-10-CM

## 2021-08-24 DIAGNOSIS — Z125 Encounter for screening for malignant neoplasm of prostate: Secondary | ICD-10-CM

## 2021-08-24 DIAGNOSIS — Z79899 Other long term (current) drug therapy: Secondary | ICD-10-CM

## 2021-08-24 NOTE — Telephone Encounter (Signed)
[facility] Night - Client Nonclinical Telephone Record  [facility] Client [facility] Night - Client Client Site [place] - Night Contact Type Call Who Is Calling Patient / Member / Family / Caregiver Caller Name Yaphet Smethurst Caller Phone Number [phone] Patient Name Nicholas Thompson Patient DOB 05-24-1955 Call Type Message Only Information Provided Reason for Call Request to Reschedule Office Appointment Initial Comment Caller states he needs to reschedule his appointment. Patient request to speak to RN No Disp. Time Disposition Final User 08/23/2021 11:11:04 PM General Information Provided Yes Gae Gallop Call Closed By: Gae Gallop Transaction Date/Time: 08/23/2021 11:08:28 PM (ET

## 2021-08-24 NOTE — Telephone Encounter (Signed)
Per appt notes pt has already cancelled lab appt for today and rescheduled CPX lab appt on 08/25/21.

## 2021-08-25 ENCOUNTER — Other Ambulatory Visit: Payer: Medicare HMO

## 2021-08-31 ENCOUNTER — Encounter: Payer: Medicare HMO | Admitting: Family Medicine

## 2021-09-08 ENCOUNTER — Other Ambulatory Visit (INDEPENDENT_AMBULATORY_CARE_PROVIDER_SITE_OTHER): Payer: Medicare HMO

## 2021-09-08 DIAGNOSIS — Z125 Encounter for screening for malignant neoplasm of prostate: Secondary | ICD-10-CM

## 2021-09-08 DIAGNOSIS — Z79899 Other long term (current) drug therapy: Secondary | ICD-10-CM | POA: Diagnosis not present

## 2021-09-08 DIAGNOSIS — E785 Hyperlipidemia, unspecified: Secondary | ICD-10-CM

## 2021-09-08 DIAGNOSIS — R7309 Other abnormal glucose: Secondary | ICD-10-CM | POA: Diagnosis not present

## 2021-09-08 LAB — HEPATIC FUNCTION PANEL
ALT: 21 U/L (ref 0–53)
AST: 20 U/L (ref 0–37)
Albumin: 4.1 g/dL (ref 3.5–5.2)
Alkaline Phosphatase: 76 U/L (ref 39–117)
Bilirubin, Direct: 0.1 mg/dL (ref 0.0–0.3)
Total Bilirubin: 0.3 mg/dL (ref 0.2–1.2)
Total Protein: 6.7 g/dL (ref 6.0–8.3)

## 2021-09-08 LAB — CBC WITH DIFFERENTIAL/PLATELET
Basophils Absolute: 0.1 10*3/uL (ref 0.0–0.1)
Basophils Relative: 0.8 % (ref 0.0–3.0)
Eosinophils Absolute: 0.3 10*3/uL (ref 0.0–0.7)
Eosinophils Relative: 3.1 % (ref 0.0–5.0)
HCT: 45.1 % (ref 39.0–52.0)
Hemoglobin: 15.4 g/dL (ref 13.0–17.0)
Lymphocytes Relative: 28.8 % (ref 12.0–46.0)
Lymphs Abs: 2.5 10*3/uL (ref 0.7–4.0)
MCHC: 34.1 g/dL (ref 30.0–36.0)
MCV: 93.2 fl (ref 78.0–100.0)
Monocytes Absolute: 0.8 10*3/uL (ref 0.1–1.0)
Monocytes Relative: 9 % (ref 3.0–12.0)
Neutro Abs: 5 10*3/uL (ref 1.4–7.7)
Neutrophils Relative %: 58.3 % (ref 43.0–77.0)
Platelets: 267 10*3/uL (ref 150.0–400.0)
RBC: 4.84 Mil/uL (ref 4.22–5.81)
RDW: 13.3 % (ref 11.5–15.5)
WBC: 8.6 10*3/uL (ref 4.0–10.5)

## 2021-09-08 LAB — LIPID PANEL
Cholesterol: 214 mg/dL — ABNORMAL HIGH (ref 0–200)
HDL: 49 mg/dL (ref >39.00)
NonHDL: 164.62
Total CHOL/HDL Ratio: 4
Triglycerides: 271 mg/dL — ABNORMAL HIGH (ref 0.0–149.0)
VLDL: 54.2 mg/dL — ABNORMAL HIGH (ref 0.0–40.0)

## 2021-09-08 LAB — BASIC METABOLIC PANEL
BUN: 15 mg/dL (ref 6–23)
CO2: 30 mEq/L (ref 19–32)
Calcium: 9.9 mg/dL (ref 8.4–10.5)
Chloride: 102 mEq/L (ref 96–112)
Creatinine, Ser: 1.17 mg/dL (ref 0.40–1.50)
GFR: 64.74 mL/min (ref >60.00)
Glucose, Bld: 105 mg/dL — ABNORMAL HIGH (ref 70–99)
Potassium: 4.6 mEq/L (ref 3.5–5.1)
Sodium: 140 mEq/L (ref 135–145)

## 2021-09-08 LAB — LDL CHOLESTEROL, DIRECT: Direct LDL: 124 mg/dL

## 2021-09-08 LAB — PSA, MEDICARE: PSA: 2.72 ng/ml (ref 0.10–4.00)

## 2021-09-08 LAB — HEMOGLOBIN A1C: Hgb A1c MFr Bld: 5.6 % (ref 4.6–6.5)

## 2021-09-14 ENCOUNTER — Ambulatory Visit (INDEPENDENT_AMBULATORY_CARE_PROVIDER_SITE_OTHER): Payer: Medicare HMO | Admitting: Family Medicine

## 2021-09-14 ENCOUNTER — Encounter: Payer: Self-pay | Admitting: Family Medicine

## 2021-09-14 VITALS — BP 142/82 | HR 68 | Temp 98.2°F | Ht 71.25 in | Wt 264.0 lb

## 2021-09-14 DIAGNOSIS — Z0000 Encounter for general adult medical examination without abnormal findings: Secondary | ICD-10-CM

## 2021-09-14 DIAGNOSIS — Z23 Encounter for immunization: Secondary | ICD-10-CM

## 2021-09-27 ENCOUNTER — Other Ambulatory Visit: Payer: Self-pay | Admitting: Family Medicine

## 2021-09-28 ENCOUNTER — Telehealth: Payer: Self-pay | Admitting: *Deleted

## 2021-09-28 DIAGNOSIS — I10 Essential (primary) hypertension: Secondary | ICD-10-CM

## 2021-09-28 MED ORDER — OMEPRAZOLE 20 MG PO CPDR: 20.0000 mg | DELAYED_RELEASE_CAPSULE | Freq: Every day | ORAL | 3 refills | Status: DC

## 2021-09-28 MED ORDER — HYDROCHLOROTHIAZIDE 12.5 MG PO TABS: 12.5000 mg | ORAL_TABLET | Freq: Every day | ORAL | 3 refills | Status: DC

## 2021-09-28 NOTE — Addendum Note (Signed)
Addended by: Carter Kitten on: 09/28/2021 03:26 PM   Modules accepted: Orders

## 2021-09-28 NOTE — Addendum Note (Signed)
Addended by: Carter Kitten on: 09/28/2021 12:12 PM   Modules accepted: Orders

## 2021-09-29 ENCOUNTER — Telehealth: Payer: Self-pay

## 2021-09-29 MED ORDER — HYDROCHLOROTHIAZIDE 12.5 MG PO TABS: 12.5000 mg | ORAL_TABLET | Freq: Every day | ORAL | 3 refills | Status: DC

## 2021-09-29 MED ORDER — OMEPRAZOLE 20 MG PO CPDR: 20.0000 mg | DELAYED_RELEASE_CAPSULE | Freq: Every day | ORAL | 3 refills | Status: DC

## 2021-09-29 NOTE — Telephone Encounter (Signed)
Rx re-faxed to [facility] at [phone].

## 2021-09-29 NOTE — Addendum Note (Signed)
Addended by: Carter Kitten on: 09/29/2021 08:14 AM   Modules accepted: Orders

## 2021-09-29 NOTE — Telephone Encounter (Signed)
Error

## 2021-09-29 NOTE — Telephone Encounter (Signed)
Unable to get refills to go through electronically.  Prescriptions printed and faxed to [facility].

## 2021-09-29 NOTE — Addendum Note (Signed)
Addended by: Carter Kitten on: 09/29/2021 08:56 AM   Modules accepted: Orders

## 2021-09-29 NOTE — Telephone Encounter (Signed)
[facility] called in asking if we can re-fax this as there system does not reflect that this has been sent in. Asked for we can re-fax this to them.

## 2021-10-01 ENCOUNTER — Other Ambulatory Visit: Payer: Self-pay | Admitting: Family Medicine

## 2021-10-01 DIAGNOSIS — I10 Essential (primary) hypertension: Secondary | ICD-10-CM

## 2021-10-29 DIAGNOSIS — C4491 Basal cell carcinoma of skin, unspecified: Secondary | ICD-10-CM | POA: Diagnosis not present

## 2021-10-29 DIAGNOSIS — D225 Melanocytic nevi of trunk: Secondary | ICD-10-CM | POA: Diagnosis not present

## 2021-10-29 DIAGNOSIS — L570 Actinic keratosis: Secondary | ICD-10-CM | POA: Diagnosis not present

## 2021-10-29 DIAGNOSIS — X32XXXA Exposure to sunlight, initial encounter: Secondary | ICD-10-CM | POA: Diagnosis not present

## 2021-12-29 DIAGNOSIS — H5005 Alternating esotropia: Secondary | ICD-10-CM | POA: Diagnosis not present

## 2021-12-29 DIAGNOSIS — H2513 Age-related nuclear cataract, bilateral: Secondary | ICD-10-CM | POA: Diagnosis not present

## 2021-12-29 DIAGNOSIS — H532 Diplopia: Secondary | ICD-10-CM | POA: Diagnosis not present

## 2022-01-12 DIAGNOSIS — C4491 Basal cell carcinoma of skin, unspecified: Secondary | ICD-10-CM | POA: Diagnosis not present

## 2022-02-02 DIAGNOSIS — L570 Actinic keratosis: Secondary | ICD-10-CM | POA: Diagnosis not present

## 2022-02-02 DIAGNOSIS — C44519 Basal cell carcinoma of skin of other part of trunk: Secondary | ICD-10-CM | POA: Diagnosis not present

## 2022-02-02 DIAGNOSIS — L910 Hypertrophic scar: Secondary | ICD-10-CM | POA: Diagnosis not present

## 2022-02-02 DIAGNOSIS — L859 Epidermal thickening, unspecified: Secondary | ICD-10-CM | POA: Diagnosis not present

## 2022-02-15 ENCOUNTER — Encounter: Payer: Self-pay | Admitting: Family Medicine

## 2022-02-15 DIAGNOSIS — I7121 Aneurysm of the ascending aorta, without rupture: Secondary | ICD-10-CM

## 2022-02-17 ENCOUNTER — Encounter: Payer: Self-pay | Admitting: Family Medicine

## 2022-02-17 ENCOUNTER — Other Ambulatory Visit (INDEPENDENT_AMBULATORY_CARE_PROVIDER_SITE_OTHER): Payer: Medicare HMO

## 2022-02-17 DIAGNOSIS — I7121 Aneurysm of the ascending aorta, without rupture: Secondary | ICD-10-CM | POA: Diagnosis not present

## 2022-02-17 LAB — BASIC METABOLIC PANEL
BUN: 17 mg/dL (ref 6–23)
CO2: 30 mEq/L (ref 19–32)
Calcium: 9.6 mg/dL (ref 8.4–10.5)
Chloride: 102 mEq/L (ref 96–112)
Creatinine, Ser: 1.19 mg/dL (ref 0.40–1.50)
GFR: 63.24 mL/min (ref >60.00)
Glucose, Bld: 110 mg/dL — ABNORMAL HIGH (ref 70–99)
Potassium: 4.2 mEq/L (ref 3.5–5.1)
Sodium: 140 mEq/L (ref 135–145)

## 2022-02-25 ENCOUNTER — Ambulatory Visit
Admission: RE | Admit: 2022-02-25 | Discharge: 2022-02-25 | Disposition: A | Discharge status: Home / Self Care | Payer: Medicare HMO | Source: Ambulatory Visit | Attending: Family Medicine | Admitting: Family Medicine

## 2022-02-25 DIAGNOSIS — I7121 Aneurysm of the ascending aorta, without rupture: Secondary | ICD-10-CM

## 2022-02-25 DIAGNOSIS — I712 Thoracic aortic aneurysm, without rupture, unspecified: Secondary | ICD-10-CM | POA: Diagnosis not present

## 2022-02-25 DIAGNOSIS — I2510 Atherosclerotic heart disease of native coronary artery without angina pectoris: Secondary | ICD-10-CM | POA: Diagnosis not present

## 2022-02-25 MED ORDER — IOPAMIDOL (ISOVUE-370) INJECTION 76%
75.0000 mL | Freq: Once | INTRAVENOUS | Status: AC | PRN
Start: 2022-02-25 — End: 2022-02-25
  Administered 2022-02-25: 75 mL via INTRAVENOUS

## 2022-04-04 NOTE — Progress Notes (Addendum)
Joeph Szatkowski T. Channa Hazelett, MD, [place] at [facility] [place], [address]  Phone: [phone]  FAX: [place] - 67 y.o. male  MRN [id number]  Date of Birth: 01-10-1956  Date: 04/05/2022  PCP: Owens Loffler, MD  Referral: Owens Loffler, MD  Chief Complaint  Patient presents with  . Back Pain   Subjective:   Nicholas Thompson is a 67 y.o. very pleasant male patient with Body mass index is 37.95 kg/m. who presents with the following:  Pleasant patient presents with chronic back pain.  He has had pain in the back before, but this is worse than prior episodes.  Having some pain in the posterior buttocks.  Bothers him when he walks and down the outside of his R leg.  It does not go below the knee.  Ongoing for about 1-2 months.    No history of back problems Has a back brace that he will wear, and this seems to help.  Takes two tylenol and some rare ibuprofen (one kidney).  He has been very sparing with his use of ibuprofen, and he will only take 1 tablet at a time every few days.  He denies bowel and bladder incontinence, saddle anesthesia.  No numbness or  Review of Systems is noted in the HPI, as appropriate  Objective:   BP 130/80   Pulse 80   Temp 98.4 F (36.9 C) (Oral)   Ht 5' 11.25" (1.81 m)   Wt 274 lb (124.3 kg)   SpO2 97%   BMI 37.95 kg/m   GEN: No acute distress; alert,appropriate. PULM: Breathing comfortably in no respiratory distress PSYCH: Normally interactive.    Range of motion at  the waist: Flexion, extension, lateral bending and rotation: Mild restriction in forward flexion and extension  No echymosis or edema Rises to examination table with mild difficulty Gait: minimally antalgic  Inspection/Deformity: N Paraspinus Tenderness: L4-S1, mostly on the right  B Ankle Dorsiflexion (L5,4): 5/5 B Great Toe Dorsiflexion (L5,4): 5/5 Heel Walk (L5): WNL Toe Walk (S1):  WNL Rise/Squat (L4): WNL, mild pain  SENSORY B Medial Foot (L4): WNL B Dorsum (L5): WNL B Lateral (S1): WNL Light Touch: WNL Pinprick: WNL  B SLR, seated: neg B SLR, supine: neg B FABER: neg B Reverse FABER: neg B Greater Troch: NT B Log Roll: neg B Sciatic Notch: NT   Laboratory and Imaging Data: January 19, 2018: CT abdomen and pelvis is reviewed.  Patient does have severe multilevel degenerative disc disease throughout the lumbar spine.  Assessment and Plan:     ICD-10-CM   1. Acute right-sided low back pain with right-sided sciatica  M54.41 Ambulatory referral to Physical Therapy    2. DDD (degenerative disc disease), lumbosacral  M51.37 Ambulatory referral to Physical Therapy     Acute on chronic low back pain with sciatica with exacerbation.  Multilevel degenerative disc disease, and would anticipate encroachment on right-sided nerve.  I will start him on a burst of some oral steroids, and if his symptoms persist, then formal physical therapy would be an appropriate next step.  Addendum: 04/13/22 12:43 PM  The patient continues to have back discomfort, and given he has multilevel degenerative disc disease, think he would benefit from some physical therapy in an acute setting and for long-term back management.  Medication Management during today's office visit: Meds ordered this encounter  Medications  . predniSONE (DELTASONE) 20 MG tablet  Sig: 2 tabs po daily for 5 days, then 1 tab po daily for 5 days    Dispense:  15 tablet    Refill:  0   There are no discontinued medications.  Orders placed today for conditions managed today: Orders Placed This Encounter  Procedures  . Ambulatory referral to Physical Therapy    Disposition: No follow-ups on file.  Dragon [employer] One speech-to-text software was used for transcription in this dictation.  Possible transcriptional errors can occur using Editor, commissioning.   Signed,  Maud Deed. Neils Siracusa,  MD   Outpatient Encounter Medications as of 04/05/2022  Medication Sig  . acetaminophen (TYLENOL) 325 MG tablet Take 1-2 tablets (325-650 mg total) by mouth every 6 (six) hours as needed for mild pain (pain score 1-3 or temp > 100.5).  . cetirizine (ZYRTEC) 10 MG tablet Take 10 mg by mouth as needed for allergies.  . Cyanocobalamin (B-12 PO) Take 1 tablet by mouth daily.  . enalapril (VASOTEC) 20 MG tablet TAKE 2 TABLETS DAILY  . hydrochlorothiazide (HYDRODIURIL) 12.5 MG tablet TAKE 1 TABLET DAILY  . ketoconazole (NIZORAL) 2 % cream APPLY 1 APPLICATION TOPICALLY DAILY AS NEEDED FOR IRRITATION  . Melatonin 10 MG CAPS Take 10 mg by mouth at bedtime as needed (sleep).  Marland Kitchen omeprazole (PRILOSEC) 20 MG capsule Take 1 capsule (20 mg total) by mouth daily.  . predniSONE (DELTASONE) 20 MG tablet 2 tabs po daily for 5 days, then 1 tab po daily for 5 days  . VITAMIN D PO Take 1 tablet by mouth daily.   No facility-administered encounter medications on file as of 04/05/2022.

## 2022-04-05 ENCOUNTER — Encounter: Payer: Self-pay | Admitting: Family Medicine

## 2022-04-05 ENCOUNTER — Ambulatory Visit (INDEPENDENT_AMBULATORY_CARE_PROVIDER_SITE_OTHER): Payer: Medicare HMO | Admitting: Family Medicine

## 2022-04-05 VITALS — BP 130/80 | HR 80 | Temp 98.4°F | Ht 71.25 in | Wt 274.0 lb

## 2022-04-05 DIAGNOSIS — M5137 Other intervertebral disc degeneration, lumbosacral region: Secondary | ICD-10-CM

## 2022-04-05 DIAGNOSIS — M5441 Lumbago with sciatica, right side: Secondary | ICD-10-CM | POA: Diagnosis not present

## 2022-04-05 MED ORDER — PREDNISONE 20 MG PO TABS: ORAL_TABLET | ORAL | 0 refills | Status: DC

## 2022-04-13 ENCOUNTER — Encounter: Payer: Self-pay | Admitting: Family Medicine

## 2022-04-13 ENCOUNTER — Encounter: Payer: Self-pay | Admitting: *Deleted

## 2022-04-13 MED ORDER — PREDNISONE 20 MG PO TABS: ORAL_TABLET | ORAL | 0 refills | Status: DC

## 2022-04-13 NOTE — Addendum Note (Signed)
Addended by: Owens Loffler on: 04/13/2022 12:43 PM   Modules accepted: Orders

## 2022-04-20 DIAGNOSIS — M5137 Other intervertebral disc degeneration, lumbosacral region: Secondary | ICD-10-CM | POA: Diagnosis not present

## 2022-04-20 DIAGNOSIS — M5441 Lumbago with sciatica, right side: Secondary | ICD-10-CM | POA: Diagnosis not present

## 2022-04-20 DIAGNOSIS — M5459 Other low back pain: Secondary | ICD-10-CM | POA: Diagnosis not present

## 2022-04-20 DIAGNOSIS — R262 Difficulty in walking, not elsewhere classified: Secondary | ICD-10-CM | POA: Diagnosis not present

## 2022-04-20 DIAGNOSIS — R2689 Other abnormalities of gait and mobility: Secondary | ICD-10-CM | POA: Diagnosis not present

## 2022-04-22 DIAGNOSIS — M5137 Other intervertebral disc degeneration, lumbosacral region: Secondary | ICD-10-CM | POA: Diagnosis not present

## 2022-04-22 DIAGNOSIS — R2689 Other abnormalities of gait and mobility: Secondary | ICD-10-CM | POA: Diagnosis not present

## 2022-04-22 DIAGNOSIS — M5459 Other low back pain: Secondary | ICD-10-CM | POA: Diagnosis not present

## 2022-04-22 DIAGNOSIS — R262 Difficulty in walking, not elsewhere classified: Secondary | ICD-10-CM | POA: Diagnosis not present

## 2022-04-22 DIAGNOSIS — M5441 Lumbago with sciatica, right side: Secondary | ICD-10-CM | POA: Diagnosis not present

## 2022-04-23 DIAGNOSIS — H532 Diplopia: Secondary | ICD-10-CM | POA: Diagnosis not present

## 2022-04-23 DIAGNOSIS — H40033 Anatomical narrow angle, bilateral: Secondary | ICD-10-CM | POA: Diagnosis not present

## 2022-04-23 DIAGNOSIS — H2513 Age-related nuclear cataract, bilateral: Secondary | ICD-10-CM | POA: Diagnosis not present

## 2022-04-23 DIAGNOSIS — H5005 Alternating esotropia: Secondary | ICD-10-CM | POA: Diagnosis not present

## 2022-04-27 DIAGNOSIS — R262 Difficulty in walking, not elsewhere classified: Secondary | ICD-10-CM | POA: Diagnosis not present

## 2022-04-27 DIAGNOSIS — M5441 Lumbago with sciatica, right side: Secondary | ICD-10-CM | POA: Diagnosis not present

## 2022-04-27 DIAGNOSIS — R2689 Other abnormalities of gait and mobility: Secondary | ICD-10-CM | POA: Diagnosis not present

## 2022-04-27 DIAGNOSIS — M5137 Other intervertebral disc degeneration, lumbosacral region: Secondary | ICD-10-CM | POA: Diagnosis not present

## 2022-04-27 DIAGNOSIS — M5459 Other low back pain: Secondary | ICD-10-CM | POA: Diagnosis not present

## 2022-04-29 DIAGNOSIS — R262 Difficulty in walking, not elsewhere classified: Secondary | ICD-10-CM | POA: Diagnosis not present

## 2022-04-29 DIAGNOSIS — M5459 Other low back pain: Secondary | ICD-10-CM | POA: Diagnosis not present

## 2022-04-29 DIAGNOSIS — M5441 Lumbago with sciatica, right side: Secondary | ICD-10-CM | POA: Diagnosis not present

## 2022-04-29 DIAGNOSIS — M5137 Other intervertebral disc degeneration, lumbosacral region: Secondary | ICD-10-CM | POA: Diagnosis not present

## 2022-04-29 DIAGNOSIS — R2689 Other abnormalities of gait and mobility: Secondary | ICD-10-CM | POA: Diagnosis not present

## 2022-05-04 DIAGNOSIS — Z0100 Encounter for examination of eyes and vision without abnormal findings: Secondary | ICD-10-CM | POA: Diagnosis not present

## 2022-05-06 DIAGNOSIS — M5441 Lumbago with sciatica, right side: Secondary | ICD-10-CM | POA: Diagnosis not present

## 2022-05-06 DIAGNOSIS — R262 Difficulty in walking, not elsewhere classified: Secondary | ICD-10-CM | POA: Diagnosis not present

## 2022-05-06 DIAGNOSIS — M5137 Other intervertebral disc degeneration, lumbosacral region: Secondary | ICD-10-CM | POA: Diagnosis not present

## 2022-05-06 DIAGNOSIS — M5459 Other low back pain: Secondary | ICD-10-CM | POA: Diagnosis not present

## 2022-05-06 DIAGNOSIS — R2689 Other abnormalities of gait and mobility: Secondary | ICD-10-CM | POA: Diagnosis not present

## 2022-05-10 DIAGNOSIS — M5137 Other intervertebral disc degeneration, lumbosacral region: Secondary | ICD-10-CM | POA: Diagnosis not present

## 2022-05-10 DIAGNOSIS — R2689 Other abnormalities of gait and mobility: Secondary | ICD-10-CM | POA: Diagnosis not present

## 2022-05-10 DIAGNOSIS — M5459 Other low back pain: Secondary | ICD-10-CM | POA: Diagnosis not present

## 2022-05-10 DIAGNOSIS — R262 Difficulty in walking, not elsewhere classified: Secondary | ICD-10-CM | POA: Diagnosis not present

## 2022-05-10 DIAGNOSIS — M5441 Lumbago with sciatica, right side: Secondary | ICD-10-CM | POA: Diagnosis not present

## 2022-05-12 DIAGNOSIS — R262 Difficulty in walking, not elsewhere classified: Secondary | ICD-10-CM | POA: Diagnosis not present

## 2022-05-12 DIAGNOSIS — M5459 Other low back pain: Secondary | ICD-10-CM | POA: Diagnosis not present

## 2022-05-12 DIAGNOSIS — R2689 Other abnormalities of gait and mobility: Secondary | ICD-10-CM | POA: Diagnosis not present

## 2022-05-12 DIAGNOSIS — M5137 Other intervertebral disc degeneration, lumbosacral region: Secondary | ICD-10-CM | POA: Diagnosis not present

## 2022-05-12 DIAGNOSIS — M5441 Lumbago with sciatica, right side: Secondary | ICD-10-CM | POA: Diagnosis not present

## 2022-05-17 DIAGNOSIS — M5441 Lumbago with sciatica, right side: Secondary | ICD-10-CM | POA: Diagnosis not present

## 2022-05-17 DIAGNOSIS — R2689 Other abnormalities of gait and mobility: Secondary | ICD-10-CM | POA: Diagnosis not present

## 2022-05-17 DIAGNOSIS — M5459 Other low back pain: Secondary | ICD-10-CM | POA: Diagnosis not present

## 2022-05-17 DIAGNOSIS — R262 Difficulty in walking, not elsewhere classified: Secondary | ICD-10-CM | POA: Diagnosis not present

## 2022-05-17 DIAGNOSIS — M5137 Other intervertebral disc degeneration, lumbosacral region: Secondary | ICD-10-CM | POA: Diagnosis not present

## 2022-05-18 DIAGNOSIS — H269 Unspecified cataract: Secondary | ICD-10-CM | POA: Diagnosis not present

## 2022-05-18 DIAGNOSIS — H5000 Unspecified esotropia: Secondary | ICD-10-CM | POA: Diagnosis not present

## 2022-05-18 DIAGNOSIS — H532 Diplopia: Secondary | ICD-10-CM | POA: Diagnosis not present

## 2022-05-18 DIAGNOSIS — H25813 Combined forms of age-related cataract, bilateral: Secondary | ICD-10-CM | POA: Diagnosis not present

## 2022-05-18 DIAGNOSIS — H5203 Hypermetropia, bilateral: Secondary | ICD-10-CM | POA: Diagnosis not present

## 2022-05-18 DIAGNOSIS — H5051 Esophoria: Secondary | ICD-10-CM | POA: Diagnosis not present

## 2022-05-18 DIAGNOSIS — H40033 Anatomical narrow angle, bilateral: Secondary | ICD-10-CM | POA: Diagnosis not present

## 2022-06-03 DIAGNOSIS — M5459 Other low back pain: Secondary | ICD-10-CM | POA: Diagnosis not present

## 2022-06-03 DIAGNOSIS — M5137 Other intervertebral disc degeneration, lumbosacral region: Secondary | ICD-10-CM | POA: Diagnosis not present

## 2022-06-03 DIAGNOSIS — R262 Difficulty in walking, not elsewhere classified: Secondary | ICD-10-CM | POA: Diagnosis not present

## 2022-06-03 DIAGNOSIS — R2689 Other abnormalities of gait and mobility: Secondary | ICD-10-CM | POA: Diagnosis not present

## 2022-06-03 DIAGNOSIS — M5441 Lumbago with sciatica, right side: Secondary | ICD-10-CM | POA: Diagnosis not present

## 2022-06-08 DIAGNOSIS — L578 Other skin changes due to chronic exposure to nonionizing radiation: Secondary | ICD-10-CM | POA: Diagnosis not present

## 2022-06-08 DIAGNOSIS — L910 Hypertrophic scar: Secondary | ICD-10-CM | POA: Diagnosis not present

## 2022-06-08 DIAGNOSIS — L570 Actinic keratosis: Secondary | ICD-10-CM | POA: Diagnosis not present

## 2022-06-08 DIAGNOSIS — Z85828 Personal history of other malignant neoplasm of skin: Secondary | ICD-10-CM | POA: Diagnosis not present

## 2022-06-08 DIAGNOSIS — Z08 Encounter for follow-up examination after completed treatment for malignant neoplasm: Secondary | ICD-10-CM | POA: Diagnosis not present

## 2022-06-08 DIAGNOSIS — D225 Melanocytic nevi of trunk: Secondary | ICD-10-CM | POA: Diagnosis not present

## 2022-06-08 DIAGNOSIS — D485 Neoplasm of uncertain behavior of skin: Secondary | ICD-10-CM | POA: Diagnosis not present

## 2022-06-10 DIAGNOSIS — M5137 Other intervertebral disc degeneration, lumbosacral region: Secondary | ICD-10-CM | POA: Diagnosis not present

## 2022-06-10 DIAGNOSIS — M5441 Lumbago with sciatica, right side: Secondary | ICD-10-CM | POA: Diagnosis not present

## 2022-06-10 DIAGNOSIS — R262 Difficulty in walking, not elsewhere classified: Secondary | ICD-10-CM | POA: Diagnosis not present

## 2022-06-10 DIAGNOSIS — R2689 Other abnormalities of gait and mobility: Secondary | ICD-10-CM | POA: Diagnosis not present

## 2022-06-10 DIAGNOSIS — M5459 Other low back pain: Secondary | ICD-10-CM | POA: Diagnosis not present

## 2022-06-21 DIAGNOSIS — H2513 Age-related nuclear cataract, bilateral: Secondary | ICD-10-CM | POA: Diagnosis not present

## 2022-06-21 DIAGNOSIS — Z9889 Other specified postprocedural states: Secondary | ICD-10-CM | POA: Diagnosis not present

## 2022-06-21 DIAGNOSIS — H5032 Intermittent alternating esotropia: Secondary | ICD-10-CM | POA: Diagnosis not present

## 2022-06-21 DIAGNOSIS — H40033 Anatomical narrow angle, bilateral: Secondary | ICD-10-CM | POA: Diagnosis not present

## 2022-08-10 DIAGNOSIS — H2511 Age-related nuclear cataract, right eye: Secondary | ICD-10-CM | POA: Diagnosis not present

## 2022-08-24 DIAGNOSIS — H2512 Age-related nuclear cataract, left eye: Secondary | ICD-10-CM | POA: Diagnosis not present

## 2022-09-19 ENCOUNTER — Other Ambulatory Visit: Payer: Self-pay | Admitting: Family Medicine

## 2022-09-20 NOTE — Telephone Encounter (Signed)
Lvmtcb, sent mychart message  

## 2022-09-20 NOTE — Telephone Encounter (Signed)
Please schedule Medicare wellness with nurse and CPE with fasting labs prior with Dr. Patsy Lager.

## 2022-10-04 DIAGNOSIS — H5005 Alternating esotropia: Secondary | ICD-10-CM | POA: Diagnosis not present

## 2022-10-12 DIAGNOSIS — C44319 Basal cell carcinoma of skin of other parts of face: Secondary | ICD-10-CM | POA: Diagnosis not present

## 2022-10-12 DIAGNOSIS — D225 Melanocytic nevi of trunk: Secondary | ICD-10-CM | POA: Diagnosis not present

## 2022-10-12 DIAGNOSIS — D2261 Melanocytic nevi of right upper limb, including shoulder: Secondary | ICD-10-CM | POA: Diagnosis not present

## 2022-10-12 DIAGNOSIS — D2271 Melanocytic nevi of right lower limb, including hip: Secondary | ICD-10-CM | POA: Diagnosis not present

## 2022-10-12 DIAGNOSIS — Z08 Encounter for follow-up examination after completed treatment for malignant neoplasm: Secondary | ICD-10-CM | POA: Diagnosis not present

## 2022-10-12 DIAGNOSIS — L570 Actinic keratosis: Secondary | ICD-10-CM | POA: Diagnosis not present

## 2022-10-12 DIAGNOSIS — Z85828 Personal history of other malignant neoplasm of skin: Secondary | ICD-10-CM | POA: Diagnosis not present

## 2022-10-12 DIAGNOSIS — L821 Other seborrheic keratosis: Secondary | ICD-10-CM | POA: Diagnosis not present

## 2022-10-12 DIAGNOSIS — D485 Neoplasm of uncertain behavior of skin: Secondary | ICD-10-CM | POA: Diagnosis not present

## 2022-10-12 DIAGNOSIS — D2272 Melanocytic nevi of left lower limb, including hip: Secondary | ICD-10-CM | POA: Diagnosis not present

## 2022-10-12 DIAGNOSIS — D2262 Melanocytic nevi of left upper limb, including shoulder: Secondary | ICD-10-CM | POA: Diagnosis not present

## 2022-11-12 ENCOUNTER — Telehealth: Payer: Self-pay

## 2022-11-12 NOTE — Patient Outreach (Signed)
  Care Coordination   11/12/2022 Name: Nicholas Thompson MRN: [id number] DOB: 01-24-1956   Care Coordination Outreach Attempts:  An unsuccessful telephone outreach was attempted today to offer the patient information about available care coordination services. HIPAA compliant voice message left.   Follow Up Plan:  Additional outreach attempts will be made to offer the patient care coordination information and services.   Encounter Outcome:  No Answer   Care Coordination Interventions:  No, not indicated    [facility] Care Coordination [phone] direct line

## 2022-11-12 NOTE — Telephone Encounter (Signed)
Patient returned call to the office, asked for a call back when possible. [phone]

## 2022-11-17 ENCOUNTER — Other Ambulatory Visit: Payer: Medicare HMO

## 2022-11-21 ENCOUNTER — Encounter: Payer: Self-pay | Admitting: Family Medicine

## 2022-11-21 ENCOUNTER — Other Ambulatory Visit: Payer: Self-pay | Admitting: Family Medicine

## 2022-11-21 DIAGNOSIS — I10 Essential (primary) hypertension: Secondary | ICD-10-CM

## 2022-11-24 ENCOUNTER — Encounter: Payer: Medicare HMO | Admitting: Family Medicine

## 2022-12-10 ENCOUNTER — Telehealth: Payer: Self-pay | Admitting: *Deleted

## 2022-12-10 DIAGNOSIS — E785 Hyperlipidemia, unspecified: Secondary | ICD-10-CM

## 2022-12-10 DIAGNOSIS — R7309 Other abnormal glucose: Secondary | ICD-10-CM

## 2022-12-10 DIAGNOSIS — Z125 Encounter for screening for malignant neoplasm of prostate: Secondary | ICD-10-CM

## 2022-12-10 DIAGNOSIS — Z79899 Other long term (current) drug therapy: Secondary | ICD-10-CM

## 2022-12-10 NOTE — Telephone Encounter (Signed)
-----   Message from Alvina Chou sent at 12/10/2022  9:09 AM EDT ----- Regarding: Lab orders for Mon, 10.7.24 Patient is scheduled for CPX labs, please order future labs, Thanks , Camelia Eng

## 2022-12-13 MED ORDER — OMEPRAZOLE 20 MG PO CPDR: 20.0000 mg | DELAYED_RELEASE_CAPSULE | Freq: Every day | ORAL | 0 refills | Status: DC

## 2022-12-27 ENCOUNTER — Ambulatory Visit: Payer: Self-pay

## 2022-12-27 ENCOUNTER — Other Ambulatory Visit (INDEPENDENT_AMBULATORY_CARE_PROVIDER_SITE_OTHER): Payer: Medicare HMO

## 2022-12-27 DIAGNOSIS — Z79899 Other long term (current) drug therapy: Secondary | ICD-10-CM | POA: Diagnosis not present

## 2022-12-27 DIAGNOSIS — R7309 Other abnormal glucose: Secondary | ICD-10-CM

## 2022-12-27 DIAGNOSIS — Z125 Encounter for screening for malignant neoplasm of prostate: Secondary | ICD-10-CM

## 2022-12-27 DIAGNOSIS — E785 Hyperlipidemia, unspecified: Secondary | ICD-10-CM

## 2022-12-27 LAB — HEPATIC FUNCTION PANEL
ALT: 26 U/L (ref 0–53)
AST: 23 U/L (ref 0–37)
Albumin: 4.1 g/dL (ref 3.5–5.2)
Alkaline Phosphatase: 82 U/L (ref 39–117)
Bilirubin, Direct: 0.1 mg/dL (ref 0.0–0.3)
Total Bilirubin: 0.6 mg/dL (ref 0.2–1.2)
Total Protein: 6.4 g/dL (ref 6.0–8.3)

## 2022-12-27 LAB — CBC WITH DIFFERENTIAL/PLATELET
Basophils Absolute: 0 10*3/uL (ref 0.0–0.1)
Basophils Relative: 0.5 % (ref 0.0–3.0)
Eosinophils Absolute: 0.3 10*3/uL (ref 0.0–0.7)
Eosinophils Relative: 3.7 % (ref 0.0–5.0)
HCT: 46.6 % (ref 39.0–52.0)
Hemoglobin: 15.4 g/dL (ref 13.0–17.0)
Lymphocytes Relative: 27.8 % (ref 12.0–46.0)
Lymphs Abs: 2.4 10*3/uL (ref 0.7–4.0)
MCHC: 33.1 g/dL (ref 30.0–36.0)
MCV: 94.1 fL (ref 78.0–100.0)
Monocytes Absolute: 0.6 10*3/uL (ref 0.1–1.0)
Monocytes Relative: 7.2 % (ref 3.0–12.0)
Neutro Abs: 5.3 10*3/uL (ref 1.4–7.7)
Neutrophils Relative %: 60.8 % (ref 43.0–77.0)
Platelets: 263 10*3/uL (ref 150.0–400.0)
RBC: 4.95 Mil/uL (ref 4.22–5.81)
RDW: 13.1 % (ref 11.5–15.5)
WBC: 8.6 10*3/uL (ref 4.0–10.5)

## 2022-12-27 LAB — LIPID PANEL
Cholesterol: 218 mg/dL — ABNORMAL HIGH (ref 0–200)
HDL: 44.3 mg/dL (ref >39.00)
LDL Cholesterol: 120 mg/dL — ABNORMAL HIGH (ref 0–99)
NonHDL: 173.91
Total CHOL/HDL Ratio: 5
Triglycerides: 270 mg/dL — ABNORMAL HIGH (ref 0.0–149.0)
VLDL: 54 mg/dL — ABNORMAL HIGH (ref 0.0–40.0)

## 2022-12-27 LAB — BASIC METABOLIC PANEL
BUN: 12 mg/dL (ref 6–23)
CO2: 30 meq/L (ref 19–32)
Calcium: 9.6 mg/dL (ref 8.4–10.5)
Chloride: 102 meq/L (ref 96–112)
Creatinine, Ser: 1.12 mg/dL (ref 0.40–1.50)
GFR: 67.61 mL/min (ref >60.00)
Glucose, Bld: 118 mg/dL — ABNORMAL HIGH (ref 70–99)
Potassium: 4.4 meq/L (ref 3.5–5.1)
Sodium: 142 meq/L (ref 135–145)

## 2022-12-27 LAB — HEMOGLOBIN A1C: Hgb A1c MFr Bld: 5.5 % (ref 4.6–6.5)

## 2022-12-27 LAB — PSA, MEDICARE: PSA: 3.05 ng/mL (ref 0.10–4.00)

## 2022-12-27 NOTE — Patient Outreach (Signed)
Care Coordination   Initial Visit Note   12/27/2022 Name: Nicholas Thompson MRN: [id number] DOB: 01/21/1956  Nicholas Thompson is a 67 y.o. year old male who sees Copland, Karleen Hampshire, MD for primary care. I spoke with  Richardean Chimera by phone today.  What matters to the patients health and wellness today?  Patient states he is doing well with managing his health conditions. He states his physical is scheduled for next week and he plans to have is vaccinations done.   Patient denies having any nursing and / or community resource needs at this time.      Goals Addressed             This Visit's Progress    Care coordination activities - no follow up needed       Interventions Today    Flowsheet Row Most Recent Value  General Interventions   General Interventions Discussed/Reviewed General Interventions Discussed, Vaccines  [Care coordination services discussed. SDOH survey completed. AWV discussed and patient advised to contact provider office to schedule. Discussed vaccines. Advised to contact PCP office if care coordination services needed in the future.]  Vaccines Flu, Pneumonia, RSV                  SDOH assessments and interventions completed:  Yes  SDOH Interventions Today    Flowsheet Row Most Recent Value  SDOH Interventions   Food Insecurity Interventions Intervention Not Indicated  Housing Interventions Intervention Not Indicated  Transportation Interventions Intervention Not Indicated        Care Coordination Interventions:  Yes, provided   Follow up plan: No further intervention required.   Encounter Outcome:  Patient Visit Completed   [facility] Care Coordination [phone] direct line

## 2023-01-01 NOTE — Progress Notes (Unsigned)
Nicholas Thompson, Nicholas Thompson, CAQ Sports Medicine [facility] at [facility] [address] [place], [address]  Phone: [phone]  FAX: [phone]  Nicholas Thompson - 67 y.o. male  MRN [id number]  Date of Birth: 1955/10/12  Date: 01/03/2023  PCP: Nicholas Thompson, Nicholas Thompson  Referral: Nicholas Thompson, Nicholas Thompson  No chief complaint on file.  Patient Care Team: Nicholas Thompson, Nicholas Thompson as PCP - General Subjective:   Nicholas Thompson is a 67 y.o. pleasant patient who presents for a medicare wellness examination:  Preventative Health Maintenance Visit:  Health Maintenance Summary Reviewed and updated, unless pt declines services.  Tobacco History Reviewed. Alcohol: No concerns, no excessive use Exercise Habits: Some activity, rec at least 30 mins 5 times a week STD concerns: no risk or activity to increase risk Drug Use: None  Shingrix Tdap RSV Covid booster  ANNUAL CTA FOR THORACIC ANEURYSM  HTN: Tolerating all medications without side effects - hydrochlorothiazide 12.5 MG Enalapril 20 mg Stable and at goal No CP, no sob. No HA.  BP Readings from Last 3 Encounters:  04/05/22 130/80  09/14/21 (!) 142/82  07/06/21 131/65    Basic Metabolic Panel:    Component Value Date/Time   NA 142 12/27/2022 0830   K 4.4 12/27/2022 0830   CL 102 12/27/2022 0830   CO2 30 12/27/2022 0830   BUN 12 12/27/2022 0830   CREATININE 1.12 12/27/2022 0830   CREATININE 1.18 03/21/2019 0748   GLUCOSE 118 (H) 12/27/2022 0830   CALCIUM 9.6 12/27/2022 0830     Health Maintenance  Topic Date Due   Zoster Vaccines- Shingrix (1 of 2) Never done   DTaP/Tdap/Td (3 - Td or Tdap) 05/25/2021   Medicare Annual Wellness (AWV)  09/15/2022   INFLUENZA VACCINE  10/21/2022   COVID-19 Vaccine (4 - 2023-24 season) 11/21/2022   Colonoscopy  07/07/2026   Pneumonia Vaccine 21+ Years old  Completed   Hepatitis C Screening  Completed   HPV VACCINES  Aged Out    Immunization History  Administered  Date(s) Administered   Fluad Quad(high Dose 65+) 02/17/2022   Influenza,inj,Quad PF,6+ Mos 01/11/2013, 01/30/2015, 02/27/2016, 05/18/2017, 01/11/2018   Influenza-Unspecified 11/21/2018, 12/04/2019   PFIZER(Purple Top)SARS-COV-2 Vaccination 06/23/2019, 07/14/2019, 01/17/2020   PNEUMOCOCCAL CONJUGATE-20 09/14/2021   Td 09/12/1998   Tdap 05/26/2011    Patient Active Problem List   Diagnosis Date Noted   Renal cell carcinoma of right kidney (HCC) 07/30/2015    Priority: High   S/P TKR (total knee replacement) using cement, left 03/13/2020   Thoracic aortic aneurysm without rupture (HCC) 03/20/2018   Status post total hip replacement, left 08/30/2017   S/p RIGHT nephrectomy 07/30/2015   History of colonic polyps 08/07/2008   HYPERLIPIDEMIA 05/18/2007   HTN (hypertension) 05/18/2007   HYPERGLYCEMIA 05/18/2007   CARCINOMA, BASAL CELL, HX OF 05/18/2007    Past Medical History:  Diagnosis Date   Basal cell carcinoma    bASAL CELL   Chronic kidney disease    GERD (gastroesophageal reflux disease)    History of colonic polyps    Hyperlipidemia    Nephrolithiasis    Renal cell carcinoma of right kidney (HCC) 07/30/2015   Nephrectomy   S/p nephrectomy 07/30/2015   Unspecified essential hypertension     Past Surgical History:  Procedure Laterality Date   arthroscopy of knee Right 1989   CYSTOSCOPY WITH STENT PLACEMENT Left 06/11/2015   Procedure: CYSTOSCOPY WITH STENT PLACEMENT/ bilateral retrograde pyelogram;  Surgeon: Hildred Laser, Nicholas Thompson;  Location: [facility] ORS;  Service: Urology;  Laterality: Left;   ESOPHAGEAL DILATION  1998 & 2001&2006   3 times   LAPAROSCOPIC NEPHRECTOMY, HAND ASSISTED Right 07/09/2015   Procedure: HAND ASSISTED LAPAROSCOPIC NEPHRECTOMY;  Surgeon: Hildred Laser, Nicholas Thompson;  Location: [facility] OR[facility];  Service: Urology;  Laterality: Right;   MOHS SURGERY Left 04/2015   [facility], [facility]   SHOULDER ARTHROSCOPY W/ SUBACROMIAL DECOMPRESSION AND DISTAL CLAVICLE EXCISION Left 08-27-2007    TOTAL HIP ARTHROPLASTY Left 08/30/2017   Procedure: TOTAL HIP ARTHROPLASTY ANTERIOR APPROACH;  Surgeon: Kennedy Bucker, Nicholas Thompson;  Location: [facility] ORS;  Service: Orthopedics;  Laterality: Left;   TOTAL KNEE ARTHROPLASTY Left 03/13/2020   Procedure: Left total knee arthroplasty - [place] Neighbor to Assist;  Surgeon: Kennedy Bucker, Nicholas Thompson;  Location: [facility] OR[facility];  Service: Orthopedics;  Laterality: Left;  [place] Neighbor to Assist   URETEROSCOPY WITH HOLMIUM LASER LITHOTRIPSY Bilateral 06/11/2015   Procedure: URETEROSCOPY WITH HOLMIUM LASER LITHOTRIPSY;  Surgeon: Hildred Laser, Nicholas Thompson;  Location: [facility];  Service: Urology;  Laterality: Bilateral;   VASECTOMY      Family History  Problem Relation Age of Onset   Cancer Mother        bladder   Stroke Mother 56   Diabetes Father    Hyperlipidemia Father    Coronary artery disease Father    Cancer Paternal Uncle        brain   Stroke Maternal Grandfather    Cancer Paternal Grandfather        lung   Prostate cancer Neg Hx    Colon cancer Neg Hx    Esophageal cancer Neg Hx    Pancreatic cancer Neg Hx    Stomach cancer Neg Hx     Social History   Social History Narrative   Not on file    Past Medical History, Surgical History, Social History, Family History, Problem List, Medications, and Allergies have been reviewed and updated if relevant.  Review of Systems: Pertinent positives are listed above.  Otherwise, a full 14 point review of systems has been done in full and it is negative except where it is noted positive.  Objective:   There were no vitals taken for this visit.    03/15/2020    8:36 PM 03/16/2020    8:02 AM 03/17/2020    5:00 AM 09/14/2021    9:01 AM 04/05/2022   10:32 AM  Fall Risk  Falls in the past year?    0 0  Was there an injury with Fall?     0  Fall Risk Category Calculator     0  (RETIRED) Patient Fall Risk Level Moderate fall risk High fall risk High fall risk    Patient at Risk for Falls Due to     No Fall Risks   Fall risk Follow up     Falls evaluation completed   Ideal Body Weight:   No results found.    04/05/2022   10:32 AM 09/14/2021    9:01 AM 05/29/2020    9:05 AM 03/28/2019    8:25 AM 03/20/2018    8:11 AM  Depression screen PHQ 2/9  Decreased Interest 0 0 0 0 0  Down, Depressed, Hopeless 0 0 0 0 0  PHQ - 2 Score 0 0 0 0 0     GEN: well developed, well nourished, no acute distress Eyes: conjunctiva and lids normal, PERRLA, EOMI ENT: TM clear, nares clear, oral exam WNL Neck: supple, no lymphadenopathy, no thyromegaly, no  JVD Pulm: clear to auscultation and percussion, respiratory effort normal CV: regular rate and rhythm, S1-S2, no murmur, rub or gallop, no bruits, peripheral pulses normal and symmetric, no cyanosis, clubbing, edema or varicosities GI: soft, non-tender; no hepatosplenomegaly, masses; active bowel sounds all quadrants GU: deferred Lymph: no cervical, axillary or inguinal adenopathy MSK: gait normal, muscle tone and strength WNL, no joint swelling, effusions, discoloration, crepitus  SKIN: clear, good turgor, color WNL, no rashes, lesions, or ulcerations Neuro: normal mental status, normal strength, sensation, and motion Psych: alert; oriented to person, place and time, normally interactive and not anxious or depressed in appearance.  All labs reviewed with patient.  Results for orders placed or performed in visit on 12/27/22  Hepatic function panel  Result Value Ref Range   Total Bilirubin 0.6 0.2 - 1.2 mg/dL   Bilirubin, Direct 0.1 0.0 - 0.3 mg/dL   Alkaline Phosphatase 82 39 - 117 U/L   AST 23 0 - 37 U/L   ALT 26 0 - 53 U/L   Total Protein 6.4 6.0 - 8.3 g/dL   Albumin 4.1 3.5 - 5.2 g/dL  Basic metabolic panel  Result Value Ref Range   Sodium 142 135 - 145 mEq/L   Potassium 4.4 3.5 - 5.1 mEq/L   [place] 102 96 - 112 mEq/L   CO2 30 19 - 32 mEq/L   Glucose, Bld 118 (H) 70 - 99 mg/dL   BUN 12 6 - 23 mg/dL   Creatinine, Ser 1.61 0.40 - 1.50 mg/dL   GFR  09.60 >45.40 mL/min   Calcium 9.6 8.4 - 10.5 mg/dL  PSA, Medicare  Result Value Ref Range   PSA 3.05 0.10 - 4.00 ng/ml  CBC with Differential/Platelet  Result Value Ref Range   WBC 8.6 4.0 - 10.5 K/uL   RBC 4.95 4.22 - 5.81 Mil/uL   Hemoglobin 15.4 13.0 - 17.0 g/dL   HCT 98.1 19.1 - 47.8 %   MCV 94.1 78.0 - 100.0 fl   MCHC 33.1 30.0 - 36.0 g/dL   RDW 29.5 62.1 - 30.8 %   Platelets 263.0 150.0 - 400.0 K/uL   Neutrophils Relative % 60.8 43.0 - 77.0 %   Lymphocytes Relative 27.8 12.0 - 46.0 %   Monocytes Relative 7.2 3.0 - 12.0 %   Eosinophils Relative 3.7 0.0 - 5.0 %   Basophils Relative 0.5 0.0 - 3.0 %   Neutro Abs 5.3 1.4 - 7.7 K/uL   Lymphs Abs 2.4 0.7 - 4.0 K/uL   Monocytes Absolute 0.6 0.1 - 1.0 K/uL   Eosinophils Absolute 0.3 0.0 - 0.7 K/uL   Basophils Absolute 0.0 0.0 - 0.1 K/uL  Lipid panel  Result Value Ref Range   Cholesterol 218 (H) 0 - 200 mg/dL   Triglycerides 657.8 (H) 0.0 - 149.0 mg/dL   HDL 46.96 >29.52 mg/dL   VLDL 84.1 (H) 0.0 - 32.4 mg/dL   LDL Cholesterol 401 (H) 0 - 99 mg/dL   Total CHOL/HDL Ratio 5    NonHDL 173.91   Hemoglobin A1c  Result Value Ref Range   Hgb A1c MFr Bld 5.5 4.6 - 6.5 %    Assessment and Plan:     ICD-10-CM   1. Healthcare maintenance  Z00.00       Health Maintenance Exam: The patient's preventative maintenance and recommended screening tests for an annual wellness exam were reviewed in full today. Brought up to date unless services declined.  Counselled on the importance of diet, exercise, and its role  in overall health and mortality. The patient's FH and SH was reviewed, including their home life, tobacco status, and drug and alcohol status.  Follow-up in 1 year for physical exam or additional follow-up below.  I have personally reviewed the Medicare Annual Wellness questionnaire and have noted 1. The patient's medical and social history 2. Their use of alcohol, tobacco or illicit drugs 3. Their current medications and  supplements 4. The patient's functional ability including ADL's, fall risks, home safety risks and hearing or visual             impairment. 5. Diet and physical activities 6. Evidence for depression or mood disorders 7. Reviewed Updated provider list, see scanned forms and [facility] Snapshot.  8. Reviewed whether or not the patient has HCPOA or living will, and discussed what this means with the patient.  Recommended he bring in a copy for his chart in [facility].  The patients weight, height, BMI and visual acuity have been recorded in the chart I have made referrals, counseling and provided education to the patient based review of the above and I have provided the pt with a written personalized care plan for preventive services.  I have provided the patient with a copy of your personalized plan for preventive services. Instructed to take the time to review along with their updated medication list.  Disposition: No follow-ups on file.  Future Appointments  Date Time Provider Department Center  01/03/2023  9:00 AM Nicholas Thompson, Nicholas Hampshire, Nicholas Thompson [facility]    No orders of the defined types were placed in this encounter.  There are no discontinued medications. No orders of the defined types were placed in this encounter.   Signed,  Elpidio Galea. Amrita Radu, Nicholas Thompson   Allergies as of 01/03/2023       Reactions   Vicodin [hydrocodone-acetaminophen] Nausea And Vomiting        Medication List        Accurate as of January 01, 2023  8:11 AM. If you have any questions, ask your nurse or doctor.          acetaminophen 325 MG tablet Commonly known as: TYLENOL Take 1-2 tablets (325-650 mg total) by mouth every 6 (six) hours as needed for mild pain (pain score 1-3 or temp > 100.5).   B-12 PO Take 1 tablet by mouth daily.   cetirizine 10 MG tablet Commonly known as: ZYRTEC Take 10 mg by mouth as needed for allergies.   enalapril 20 MG tablet Commonly known as: VASOTEC TAKE 2 TABLETS DAILY    hydrochlorothiazide 12.5 MG tablet Commonly known as: HYDRODIURIL TAKE 1 TABLET DAILY   ketoconazole 2 % cream Commonly known as: NIZORAL APPLY 1 APPLICATION TOPICALLY DAILY AS NEEDED FOR IRRITATION   Melatonin 10 MG Caps Take 10 mg by mouth at bedtime as needed (sleep).   omeprazole 20 MG capsule Commonly known as: PRILOSEC Take 1 capsule (20 mg total) by mouth daily.   predniSONE 20 MG tablet Commonly known as: DELTASONE 2 tabs po daily for 5 days, then 1 tab po daily for 5 days   predniSONE 20 MG tablet Commonly known as: DELTASONE 2 tabs po for 7 days, then 1 tab po for 7 days   VITAMIN D PO Take 1 tablet by mouth daily.

## 2023-01-03 ENCOUNTER — Ambulatory Visit: Payer: Medicare HMO | Admitting: Family Medicine

## 2023-01-03 ENCOUNTER — Encounter: Payer: Self-pay | Admitting: Family Medicine

## 2023-01-03 VITALS — BP 130/80 | HR 65 | Temp 98.2°F | Ht 71.0 in | Wt 277.4 lb

## 2023-01-03 DIAGNOSIS — I7121 Aneurysm of the ascending aorta, without rupture: Secondary | ICD-10-CM

## 2023-01-03 DIAGNOSIS — E782 Mixed hyperlipidemia: Secondary | ICD-10-CM

## 2023-01-03 DIAGNOSIS — Z23 Encounter for immunization: Secondary | ICD-10-CM | POA: Diagnosis not present

## 2023-01-03 DIAGNOSIS — Z0000 Encounter for general adult medical examination without abnormal findings: Secondary | ICD-10-CM | POA: Diagnosis not present

## 2023-01-03 MED ORDER — ROSUVASTATIN CALCIUM 20 MG PO TABS: 20.0000 mg | ORAL_TABLET | Freq: Every day | ORAL | 3 refills | Status: DC

## 2023-01-03 NOTE — Patient Instructions (Signed)
Vaccines at the pharmacy:  Shingrix vaccine for Shingles  RSV vaccine  Tdap vaccine

## 2023-01-05 ENCOUNTER — Encounter: Payer: Self-pay | Admitting: *Deleted

## 2023-01-06 NOTE — Telephone Encounter (Signed)
[facility]  NPI # [id number]  [address] [place], [place] [address]  P: [phone]  F: [phone]

## 2023-02-02 DIAGNOSIS — I7121 Aneurysm of the ascending aorta, without rupture: Secondary | ICD-10-CM | POA: Diagnosis not present

## 2023-02-03 ENCOUNTER — Encounter: Payer: Self-pay | Admitting: Family Medicine

## 2023-02-13 ENCOUNTER — Other Ambulatory Visit: Payer: Self-pay | Admitting: Family Medicine

## 2023-02-13 DIAGNOSIS — I10 Essential (primary) hypertension: Secondary | ICD-10-CM

## 2023-02-15 DIAGNOSIS — C44719 Basal cell carcinoma of skin of left lower limb, including hip: Secondary | ICD-10-CM | POA: Diagnosis not present

## 2023-02-15 DIAGNOSIS — Z85828 Personal history of other malignant neoplasm of skin: Secondary | ICD-10-CM | POA: Diagnosis not present

## 2023-02-15 DIAGNOSIS — C44519 Basal cell carcinoma of skin of other part of trunk: Secondary | ICD-10-CM | POA: Diagnosis not present

## 2023-02-15 DIAGNOSIS — D2261 Melanocytic nevi of right upper limb, including shoulder: Secondary | ICD-10-CM | POA: Diagnosis not present

## 2023-02-15 DIAGNOSIS — L570 Actinic keratosis: Secondary | ICD-10-CM | POA: Diagnosis not present

## 2023-02-15 DIAGNOSIS — D2271 Melanocytic nevi of right lower limb, including hip: Secondary | ICD-10-CM | POA: Diagnosis not present

## 2023-02-15 DIAGNOSIS — L578 Other skin changes due to chronic exposure to nonionizing radiation: Secondary | ICD-10-CM | POA: Diagnosis not present

## 2023-02-15 DIAGNOSIS — D2262 Melanocytic nevi of left upper limb, including shoulder: Secondary | ICD-10-CM | POA: Diagnosis not present

## 2023-02-15 DIAGNOSIS — D485 Neoplasm of uncertain behavior of skin: Secondary | ICD-10-CM | POA: Diagnosis not present

## 2023-02-15 DIAGNOSIS — C4441 Basal cell carcinoma of skin of scalp and neck: Secondary | ICD-10-CM | POA: Diagnosis not present

## 2023-02-15 DIAGNOSIS — D2272 Melanocytic nevi of left lower limb, including hip: Secondary | ICD-10-CM | POA: Diagnosis not present

## 2023-02-15 DIAGNOSIS — D225 Melanocytic nevi of trunk: Secondary | ICD-10-CM | POA: Diagnosis not present

## 2023-02-15 DIAGNOSIS — L821 Other seborrheic keratosis: Secondary | ICD-10-CM | POA: Diagnosis not present

## 2023-02-15 DIAGNOSIS — Z08 Encounter for follow-up examination after completed treatment for malignant neoplasm: Secondary | ICD-10-CM | POA: Diagnosis not present

## 2023-02-28 DIAGNOSIS — C44319 Basal cell carcinoma of skin of other parts of face: Secondary | ICD-10-CM | POA: Diagnosis not present

## 2023-03-07 ENCOUNTER — Other Ambulatory Visit (INDEPENDENT_AMBULATORY_CARE_PROVIDER_SITE_OTHER): Payer: Medicare HMO

## 2023-03-07 DIAGNOSIS — E782 Mixed hyperlipidemia: Secondary | ICD-10-CM | POA: Diagnosis not present

## 2023-03-07 LAB — HEPATIC FUNCTION PANEL
ALT: 25 U/L (ref 0–53)
AST: 23 U/L (ref 0–37)
Albumin: 4.1 g/dL (ref 3.5–5.2)
Alkaline Phosphatase: 83 U/L (ref 39–117)
Bilirubin, Direct: 0.2 mg/dL (ref 0.0–0.3)
Total Bilirubin: 0.8 mg/dL (ref 0.2–1.2)
Total Protein: 6.4 g/dL (ref 6.0–8.3)

## 2023-03-07 LAB — LIPID PANEL
Cholesterol: 142 mg/dL (ref 0–200)
HDL: 46.1 mg/dL (ref >39.00)
LDL Cholesterol: 59 mg/dL (ref 0–99)
NonHDL: 95.55
Total CHOL/HDL Ratio: 3
Triglycerides: 185 mg/dL — ABNORMAL HIGH (ref 0.0–149.0)
VLDL: 37 mg/dL (ref 0.0–40.0)

## 2023-04-05 DIAGNOSIS — C44719 Basal cell carcinoma of skin of left lower limb, including hip: Secondary | ICD-10-CM | POA: Diagnosis not present

## 2023-04-19 DIAGNOSIS — C44519 Basal cell carcinoma of skin of other part of trunk: Secondary | ICD-10-CM | POA: Diagnosis not present

## 2023-06-17 DIAGNOSIS — D225 Melanocytic nevi of trunk: Secondary | ICD-10-CM | POA: Diagnosis not present

## 2023-06-17 DIAGNOSIS — Z08 Encounter for follow-up examination after completed treatment for malignant neoplasm: Secondary | ICD-10-CM | POA: Diagnosis not present

## 2023-06-17 DIAGNOSIS — L570 Actinic keratosis: Secondary | ICD-10-CM | POA: Diagnosis not present

## 2023-06-17 DIAGNOSIS — C44319 Basal cell carcinoma of skin of other parts of face: Secondary | ICD-10-CM | POA: Diagnosis not present

## 2023-06-17 DIAGNOSIS — D2272 Melanocytic nevi of left lower limb, including hip: Secondary | ICD-10-CM | POA: Diagnosis not present

## 2023-06-17 DIAGNOSIS — D485 Neoplasm of uncertain behavior of skin: Secondary | ICD-10-CM | POA: Diagnosis not present

## 2023-06-17 DIAGNOSIS — C44612 Basal cell carcinoma of skin of right upper limb, including shoulder: Secondary | ICD-10-CM | POA: Diagnosis not present

## 2023-06-17 DIAGNOSIS — D2262 Melanocytic nevi of left upper limb, including shoulder: Secondary | ICD-10-CM | POA: Diagnosis not present

## 2023-06-17 DIAGNOSIS — Z85828 Personal history of other malignant neoplasm of skin: Secondary | ICD-10-CM | POA: Diagnosis not present

## 2023-06-17 DIAGNOSIS — D2271 Melanocytic nevi of right lower limb, including hip: Secondary | ICD-10-CM | POA: Diagnosis not present

## 2023-06-17 DIAGNOSIS — L720 Epidermal cyst: Secondary | ICD-10-CM | POA: Diagnosis not present

## 2023-06-17 DIAGNOSIS — L578 Other skin changes due to chronic exposure to nonionizing radiation: Secondary | ICD-10-CM | POA: Diagnosis not present

## 2023-06-17 DIAGNOSIS — D2261 Melanocytic nevi of right upper limb, including shoulder: Secondary | ICD-10-CM | POA: Diagnosis not present

## 2023-06-22 ENCOUNTER — Encounter: Payer: Self-pay | Admitting: Internal Medicine

## 2023-06-22 ENCOUNTER — Ambulatory Visit (INDEPENDENT_AMBULATORY_CARE_PROVIDER_SITE_OTHER): Admitting: Internal Medicine

## 2023-06-22 ENCOUNTER — Ambulatory Visit: Payer: Self-pay

## 2023-06-22 VITALS — BP 138/84 | HR 57 | Temp 98.5°F | Ht 71.0 in | Wt 279.0 lb

## 2023-06-22 DIAGNOSIS — M545 Low back pain, unspecified: Secondary | ICD-10-CM | POA: Diagnosis not present

## 2023-06-22 MED ORDER — TIZANIDINE HCL 2 MG PO TABS: 2.0000 mg | ORAL_TABLET | Freq: Three times a day (TID) | ORAL | 0 refills | Status: DC | PRN

## 2023-06-22 NOTE — Telephone Encounter (Signed)
 I will check him at today's appointment

## 2023-06-22 NOTE — Progress Notes (Signed)
 Subjective:    Patient ID: Nicholas Thompson, male    DOB: [birth year], 68 y.o.   MRN: [id number]  HPI Here due to left back pain  Left pain around the waist started about a month ago--points to lumbar paraspinal area No known injury or special tasks before this started Low grade at times--but can grab him and is very "intense" (will calm in 15-20 minutes) Can be bad getting into bed (lifting leg up) Has been careful about lifting---like wife's oxygen tank Seems better if he can relax  No radiation of pain No leg weakness  Takes 2 tylenol every morning Rare ibuprofen (protecting kidney)---does help  Current Outpatient Medications on File Prior to Visit  Medication Sig Dispense Refill   acetaminophen (TYLENOL) 325 MG tablet Take 1-2 tablets (325-650 mg total) by mouth every 6 (six) hours as needed for mild pain (pain score 1-3 or temp > 100.5).     cetirizine (ZYRTEC) 10 MG tablet Take 10 mg by mouth daily.     Cyanocobalamin (B-12 PO) Take 1 tablet by mouth daily.     enalapril (VASOTEC) 20 MG tablet TAKE 2 TABLETS DAILY 180 tablet 3   hydrochlorothiazide (HYDRODIURIL) 12.5 MG tablet TAKE 1 TABLET DAILY 90 tablet 3   ketoconazole (NIZORAL) 2 % cream APPLY 1 APPLICATION TOPICALLY DAILY AS NEEDED FOR IRRITATION 30 g 0   Melatonin 10 MG CAPS Take 10 mg by mouth at bedtime as needed (sleep).     omeprazole (PRILOSEC) 20 MG capsule TAKE 1 CAPSULE DAILY 90 capsule 3   rosuvastatin (CRESTOR) 20 MG tablet Take 1 tablet (20 mg total) by mouth daily. 90 tablet 3   VITAMIN D PO Take 1 tablet by mouth daily.     No current facility-administered medications on file prior to visit.    Allergies  Allergen Reactions   Vicodin [Hydrocodone-Acetaminophen] Nausea And Vomiting    Past Medical History:  Diagnosis Date   Basal cell carcinoma    bASAL CELL   Chronic kidney disease    GERD (gastroesophageal reflux disease)    History of colonic polyps    Hyperlipidemia    Nephrolithiasis    Renal  cell carcinoma of right kidney (HCC) 07/30/2015   Nephrectomy   S/p nephrectomy 07/30/2015   Unspecified essential hypertension     Past Surgical History:  Procedure Laterality Date   arthroscopy of knee Right 1989   CYSTOSCOPY WITH STENT PLACEMENT Left 06/11/2015   Procedure: CYSTOSCOPY WITH STENT PLACEMENT/ bilateral retrograde pyelogram;  Surgeon: Hildred Laser, MD;  Location: [facility];  Service: Urology;  Laterality: Left;   ESOPHAGEAL DILATION  1998 & 2001&2006   3 times   LAPAROSCOPIC NEPHRECTOMY, HAND ASSISTED Right 07/09/2015   Procedure: HAND ASSISTED LAPAROSCOPIC NEPHRECTOMY;  Surgeon: Hildred Laser, MD;  Location: [facility];  Service: Urology;  Laterality: Right;   MOHS SURGERY Left 04/2015   [facility]ar, [facility]   SHOULDER ARTHROSCOPY W/ SUBACROMIAL DECOMPRESSION AND DISTAL CLAVICLE EXCISION Left 08-27-2007   TOTAL HIP ARTHROPLASTY Left 08/30/2017   Procedure: TOTAL HIP ARTHROPLASTY ANTERIOR APPROACH;  Surgeon: Kennedy Bucker, MD;  Location: [facility];  Service: Orthopedics;  Laterality: Left;   TOTAL KNEE ARTHROPLASTY Left 03/13/2020   Procedure: Left total knee arthroplasty - [place] Neighbor to Assist;  Surgeon: Kennedy Bucker, MD;  Location: [facility];  Service: Orthopedics;  Laterality: Left;  [place] Neighbor to Assist   URETEROSCOPY WITH HOLMIUM LASER LITHOTRIPSY Bilateral 06/11/2015   Procedure: URETEROSCOPY WITH HOLMIUM LASER LITHOTRIPSY;  Surgeon:  Hildred Laser, MD;  Location: [facility];  Service: Urology;  Laterality: Bilateral;   VASECTOMY      Family History  Problem Relation Age of Onset   Cancer Mother        bladder   Stroke Mother 36   Diabetes Father    Hyperlipidemia Father    Coronary artery disease Father    Cancer Paternal Uncle        brain   Stroke Maternal Grandfather    Cancer Paternal Grandfather        lung   Prostate cancer Neg Hx    Colon cancer Neg Hx    Esophageal cancer Neg Hx    Pancreatic cancer Neg Hx    Stomach cancer Neg Hx     Social  History   Socioeconomic History   Marital status: Married    Spouse name: debbie   Number of children: 2   Years of education: Not on file   Highest education level: Not on file  Occupational History   Occupation: sales  Tobacco Use   Smoking status: Former    Types: Cigars   Smokeless tobacco: Never  Vaping Use   Vaping status: Never Used  Substance and Sexual Activity   Alcohol use: Yes    Alcohol/week: 0.0 standard drinks of alcohol    Comment: rarely   Drug use: No   Sexual activity: Not on file  Other Topics Concern   Not on file  Social History Narrative   Not on file   Social Drivers of Health   Financial Resource Strain: Not on file  Food Insecurity: No Food Insecurity (12/27/2022)   Hunger Vital Sign    Worried About Running Out of Food in the Last Year: Never true    Ran Out of Food in the Last Year: Never true  Transportation Needs: No Transportation Needs (12/27/2022)   PRAPARE - Administrator, Civil Service (Medical): No    Lack of Transportation (Non-Medical): No  Physical Activity: Not on file  Stress: Not on file  Social Connections: Not on file  Intimate Partner Violence: Not on file   Review of Systems Right kidney removed 2017--[facility]. No recurrence No fever Voids okay---no urgency or dysuria    Objective:   Physical Exam Musculoskeletal:     Comments: Tenderness along lateral lumbar area ~T2-3 No hip tenderness ---and normal ROM SLR negative No spine tenderness  Neurological:     Comments: Walks slowly but not antalgic No leg weakness            Assessment & Plan:

## 2023-06-22 NOTE — Assessment & Plan Note (Signed)
 Reassured---just seems to be muscular Recommended heat and topical lidocaine Should take tylenol three times a day for now Okay occasional ibuprofen Rx tizanidine 2 mg tid prn

## 2023-06-22 NOTE — Telephone Encounter (Signed)
   Chief Complaint: left side pain Symptoms: pain in left waistline around to lower back Frequency: worsening over 3 weeks Pertinent Negatives: Patient denies weakness, numbness, vomiting Disposition: [] ED /[] Urgent Care (no appt availability in office) / [x] Appointment(In office/virtual)/ []  [place]/ [] Home Care/ [] Refused Recommended Disposition /[] [place] [facility]/ []  Follow-up with PCP Additional Notes: Patient c/o 9/10 left sided abdominal pain that wraps around to his lower back. Patient reports pain is currently at a 1 or 2/10 but will randomly come and go to a 9/10. Patient denies injury, weakness, numbness, vomiting. Per protocol, appt scheduled today 4/2. Patient advised to call back with worsening symptoms. Patient verbalized understanding.   Copied from CRM [id number]. Topic: Clinical - Red Word Triage >> Jun 22, 2023  9:43 AM [place] A wrote: [facility] that prompted transfer to Nurse Triage: Patient has pain on left side of back near waist area 1-10;9. Has been like this for a few weeks Reason for Disposition  [1] MILD-MODERATE pain AND [2] constant AND [3] present > 2 hours  Answer Assessment - Initial Assessment Questions 1. LOCATION: "Where does it hurt?"      Left side waistline 2. RADIATION: "Does the pain shoot anywhere else?" (e.g., chest, back)     Lower back 3. ONSET: "When did the pain begin?" (Minutes, hours or days ago)      3 weeks ago 4. SUDDEN: "Gradual or sudden onset?"     gradual 5. PATTERN "Does the pain come and go, or is it constant?"    - If it comes and goes: "How long does it last?" "Do you have pain now?"     (Note: Comes and goes means the pain is intermittent. It goes away completely between bouts.)    - If constant: "Is it getting better, staying the same, or getting worse?"      (Note: Constant means the pain never goes away completely; most serious pain is constant and gets worse.)      Comes and goes 6. SEVERITY: "How bad is  the pain?"  (e.g., Scale 1-10; mild, moderate, or severe)    - MILD (1-3): Doesn't interfere with normal activities, abdomen soft and not tender to touch.     - MODERATE (4-7): Interferes with normal activities or awakens from sleep, abdomen tender to touch.     - SEVERE (8-10): Excruciating pain, doubled over, unable to do any normal activities.       severe 7. RECURRENT SYMPTOM: "Have you ever had this type of stomach pain before?" If Yes, ask: "When was the last time?" and "What happened that time?"      no 8. CAUSE: "What do you think is causing the stomach pain?"     unsure 9. RELIEVING/AGGRAVATING FACTORS: "What makes it better or worse?" (e.g., antacids, bending or twisting motion, bowel movement)     none 10. OTHER SYMPTOMS: "Do you have any other symptoms?" (e.g., back pain, diarrhea, fever, urination pain, vomiting)       none  Protocols used: Abdominal Pain - Male-A-AH

## 2023-07-29 DIAGNOSIS — C44612 Basal cell carcinoma of skin of right upper limb, including shoulder: Secondary | ICD-10-CM | POA: Diagnosis not present

## 2023-10-10 DIAGNOSIS — C44319 Basal cell carcinoma of skin of other parts of face: Secondary | ICD-10-CM | POA: Diagnosis not present

## 2023-10-23 ENCOUNTER — Other Ambulatory Visit: Payer: Self-pay | Admitting: Family Medicine

## 2023-10-28 DIAGNOSIS — Z08 Encounter for follow-up examination after completed treatment for malignant neoplasm: Secondary | ICD-10-CM | POA: Diagnosis not present

## 2023-10-28 DIAGNOSIS — C44329 Squamous cell carcinoma of skin of other parts of face: Secondary | ICD-10-CM | POA: Diagnosis not present

## 2023-10-28 DIAGNOSIS — D2262 Melanocytic nevi of left upper limb, including shoulder: Secondary | ICD-10-CM | POA: Diagnosis not present

## 2023-10-28 DIAGNOSIS — Z85828 Personal history of other malignant neoplasm of skin: Secondary | ICD-10-CM | POA: Diagnosis not present

## 2023-10-28 DIAGNOSIS — L578 Other skin changes due to chronic exposure to nonionizing radiation: Secondary | ICD-10-CM | POA: Diagnosis not present

## 2023-10-28 DIAGNOSIS — D2272 Melanocytic nevi of left lower limb, including hip: Secondary | ICD-10-CM | POA: Diagnosis not present

## 2023-10-28 DIAGNOSIS — D485 Neoplasm of uncertain behavior of skin: Secondary | ICD-10-CM | POA: Diagnosis not present

## 2023-10-28 DIAGNOSIS — D2261 Melanocytic nevi of right upper limb, including shoulder: Secondary | ICD-10-CM | POA: Diagnosis not present

## 2023-10-28 DIAGNOSIS — C4441 Basal cell carcinoma of skin of scalp and neck: Secondary | ICD-10-CM | POA: Diagnosis not present

## 2023-10-28 DIAGNOSIS — D225 Melanocytic nevi of trunk: Secondary | ICD-10-CM | POA: Diagnosis not present

## 2023-10-28 DIAGNOSIS — L570 Actinic keratosis: Secondary | ICD-10-CM | POA: Diagnosis not present

## 2023-10-28 DIAGNOSIS — D2271 Melanocytic nevi of right lower limb, including hip: Secondary | ICD-10-CM | POA: Diagnosis not present

## 2023-11-25 DIAGNOSIS — C4441 Basal cell carcinoma of skin of scalp and neck: Secondary | ICD-10-CM | POA: Diagnosis not present

## 2023-12-01 DIAGNOSIS — Z961 Presence of intraocular lens: Secondary | ICD-10-CM | POA: Diagnosis not present

## 2023-12-01 DIAGNOSIS — H35371 Puckering of macula, right eye: Secondary | ICD-10-CM | POA: Diagnosis not present

## 2023-12-01 DIAGNOSIS — H43821 Vitreomacular adhesion, right eye: Secondary | ICD-10-CM | POA: Diagnosis not present

## 2023-12-06 DIAGNOSIS — C44329 Squamous cell carcinoma of skin of other parts of face: Secondary | ICD-10-CM | POA: Diagnosis not present

## 2023-12-29 ENCOUNTER — Ambulatory Visit (INDEPENDENT_AMBULATORY_CARE_PROVIDER_SITE_OTHER)

## 2023-12-29 VITALS — BP 138/84 | Ht 71.0 in | Wt 276.0 lb

## 2023-12-29 DIAGNOSIS — Z0000 Encounter for general adult medical examination without abnormal findings: Secondary | ICD-10-CM

## 2023-12-29 NOTE — Progress Notes (Signed)
 Because this visit was a virtual/telehealth visit,  certain criteria was not obtained, such a blood pressure, CBG if applicable, and timed get up and go. Any medications not marked as taking were not mentioned during the medication reconciliation part of the visit. Any vitals not documented were not able to be obtained due to this being a telehealth visit or patient was unable to self-report a recent blood pressure reading due to a lack of equipment at home via telehealth. Vitals that have been documented are verbally provided by the patient.  This visit was performed by a medical professional under my direct supervision. I was immediately available for consultation/collaboration. I have reviewed and agree with the Annual Wellness Visit documentation.  Subjective:   Nicholas Thompson is a 68 y.o. who presents for a Medicare Wellness preventive visit.  As a reminder, Annual Wellness Visits don't include a physical exam, and some assessments may be limited, especially if this visit is performed virtually. We may recommend an in-person follow-up visit with your provider if needed.  Visit Complete: Virtual I connected with  Nicholas Thompson on 12/29/23 by a video and audio enabled telemedicine application and verified that I am speaking with the correct person using two identifiers.  Patient Location: Home  Provider Location: Home Office  I discussed the limitations of evaluation and management by telemedicine. The patient expressed understanding and agreed to proceed.  Vital Signs: Because this visit was a virtual/telehealth visit, some criteria may be missing or patient reported. Any vitals not documented were not able to be obtained and vitals that have been documented are patient reported.  Persons Participating in Visit: Patient.  AWV Questionnaire: No: Patient Medicare AWV questionnaire was not completed prior to this visit.  Cardiac Risk Factors include: advanced age (>51men, >46 women);male  gender;hypertension;dyslipidemia;obesity (BMI >30kg/m2)     Objective:    Today's Vitals   12/29/23 0947  BP: 138/84  Weight: 276 lb (125.2 kg)  Height: 5' 11 (1.803 m)   Body mass index is 38.49 kg/m.     12/29/2023    9:51 AM 03/16/2020    6:00 PM 03/04/2020    2:06 PM 08/30/2017    7:44 PM 08/30/2017   11:33 AM 08/23/2017    8:59 AM 07/09/2015   11:00 AM  Advanced Directives  Does Patient Have a Medical Advance Directive? No No No No  No  No  No   Would patient like information on creating a medical advance directive? No - Patient declined No - Patient declined  No - Patient declined  No - Patient declined  No - Patient declined  No - patient declined information      Data saved with a previous flowsheet row definition    Current Medications (verified) Outpatient Encounter Medications as of 12/29/2023  Medication Sig   acetaminophen  (TYLENOL ) 325 MG tablet Take 1-2 tablets (325-650 mg total) by mouth every 6 (six) hours as needed for mild pain (pain score 1-3 or temp > 100.5).   cetirizine (ZYRTEC) 10 MG tablet Take 10 mg by mouth daily.   Cyanocobalamin (B-12 PO) Take 1 tablet by mouth daily.   enalapril  (VASOTEC ) 20 MG tablet TAKE 2 TABLETS DAILY   hydrochlorothiazide  (HYDRODIURIL ) 12.5 MG tablet TAKE 1 TABLET DAILY   ketoconazole  (NIZORAL ) 2 % cream APPLY 1 APPLICATION TOPICALLY DAILY AS NEEDED FOR IRRITATION   Melatonin 10 MG CAPS Take 10 mg by mouth at bedtime as needed (sleep).   omeprazole  (PRILOSEC) 20 MG capsule TAKE  1 CAPSULE DAILY   rosuvastatin  (CRESTOR ) 20 MG tablet Take 1 tablet (20 mg total) by mouth daily.   tiZANidine  (ZANAFLEX ) 2 MG tablet Take 1 tablet (2 mg total) by mouth 3 (three) times daily as needed for muscle spasms.   VITAMIN D  PO Take 1 tablet by mouth daily.   No facility-administered encounter medications on file as of 12/29/2023.    Allergies (verified) Vicodin [hydrocodone -acetaminophen ]   History: Past Medical History:  Diagnosis Date    Basal cell carcinoma    bASAL CELL   Chronic kidney disease    GERD (gastroesophageal reflux disease)    History of colonic polyps    Hyperlipidemia    Nephrolithiasis    Renal cell carcinoma of right kidney (HCC) 07/30/2015   Nephrectomy   S/p nephrectomy 07/30/2015   Unspecified essential hypertension    Past Surgical History:  Procedure Laterality Date   arthroscopy of knee Right 1989   CYSTOSCOPY WITH STENT PLACEMENT Left 06/11/2015   Procedure: CYSTOSCOPY WITH STENT PLACEMENT/ bilateral retrograde pyelogram;  Surgeon: Redell Lynwood Napoleon, MD;  Location: [facility];  Service: Urology;  Laterality: Left;   ESOPHAGEAL DILATION  1998 & 2001&2006   3 times   LAPAROSCOPIC NEPHRECTOMY, HAND ASSISTED Right 07/09/2015   Procedure: HAND ASSISTED LAPAROSCOPIC NEPHRECTOMY;  Surgeon: Redell Lynwood Napoleon, MD;  Location: [facility];  Service: Urology;  Laterality: Right;   MOHS SURGERY Left 04/2015   [facility], [facility]   SHOULDER ARTHROSCOPY W/ SUBACROMIAL DECOMPRESSION AND DISTAL CLAVICLE EXCISION Left 08-27-2007   TOTAL HIP ARTHROPLASTY Left 08/30/2017   Procedure: TOTAL HIP ARTHROPLASTY ANTERIOR APPROACH;  Surgeon: Kathlynn Sharper, MD;  Location: [facility];  Service: Orthopedics;  Laterality: Left;   TOTAL KNEE ARTHROPLASTY Left 03/13/2020   Procedure: Left total knee arthroplasty - [place] Amber to Assist;  Surgeon: Kathlynn Sharper, MD;  Location: [facility];  Service: Orthopedics;  Laterality: Left;  [place] Amber to Assist   URETEROSCOPY WITH HOLMIUM LASER LITHOTRIPSY Bilateral 06/11/2015   Procedure: URETEROSCOPY WITH HOLMIUM LASER LITHOTRIPSY;  Surgeon: Redell Lynwood Napoleon, MD;  Location: [facility];  Service: Urology;  Laterality: Bilateral;   VASECTOMY     Family History  Problem Relation Age of Onset   Cancer Mother        bladder   Stroke Mother 69   Diabetes Father    Hyperlipidemia Father    Coronary artery disease Father    Cancer Paternal Uncle        brain   Stroke Maternal Grandfather    Cancer  Paternal Grandfather        lung   Prostate cancer Neg Hx    Colon cancer Neg Hx    Esophageal cancer Neg Hx    Pancreatic cancer Neg Hx    Stomach cancer Neg Hx    Social History   Socioeconomic History   Marital status: Married    Spouse name: debbie   Number of children: 2   Years of education: Not on file   Highest education level: Not on file  Occupational History   Occupation: sales  Tobacco Use   Smoking status: Former    Types: Cigars   Smokeless tobacco: Never  Vaping Use   Vaping status: Never Used  Substance and Sexual Activity   Alcohol use: Yes    Alcohol/week: 0.0 standard drinks of alcohol    Comment: rarely   Drug use: No   Sexual activity: Not on file  Other Topics Concern   Not on file  Social History Narrative   Not on file   Social Drivers of Health   Financial Resource Strain: Low Risk  (12/29/2023)   Overall Financial Resource Strain (CARDIA)    Difficulty of Paying Living Expenses: Not hard at all  Food Insecurity: No Food Insecurity (12/29/2023)   Hunger Vital Sign    Worried About Running Out of Food in the Last Year: Never true    Ran Out of Food in the Last Year: Never true  Transportation Needs: No Transportation Needs (12/29/2023)   PRAPARE - Administrator, Civil Service (Medical): No    Lack of Transportation (Non-Medical): No  Physical Activity: Insufficiently Active (12/29/2023)   Exercise Vital Sign    Days of Exercise per Week: 7 days    Minutes of Exercise per Session: 20 min  Stress: No Stress Concern Present (12/29/2023)   [employer] of Occupational Health - Occupational Stress Questionnaire    Feeling of Stress: Not at all  Social Connections: Socially Integrated (12/29/2023)   Social Connection and Isolation Panel    Frequency of Communication with Friends and Family: More than three times a week    Frequency of Social Gatherings with Friends and Family: More than three times a week    Attends Religious  Services: 1 to 4 times per year    Active Member of [employer] or Organizations: Yes    Attends Banker Meetings: Never    Marital Status: Married    Tobacco Counseling Counseling given: Not Answered    Clinical Intake:  Pre-visit preparation completed: Yes  Pain : No/denies pain     BMI - recorded: 38.49 Nutritional Status: BMI > 30  Obese Nutritional Risks: None Diabetes: No  Lab Results  Component Value Date   HGBA1C 5.5 12/27/2022   HGBA1C 5.6 09/08/2021   HGBA1C 5.5 05/28/2020     How often do you need to have someone help you when you read instructions, pamphlets, or other written materials from your doctor or pharmacy?: 1 - Never  Interpreter Needed?: No  Information entered by :: Ashrita Chrismer,CMA   Activities of Daily Living     12/29/2023    9:50 AM  In your present state of health, do you have any difficulty performing the following activities:  Hearing? 0  Vision? 0  Difficulty concentrating or making decisions? 0  Walking or climbing stairs? 0  Dressing or bathing? 0  Doing errands, shopping? 0  Preparing Food and eating ? N  Using the Toilet? N  In the past six months, have you accidently leaked urine? N  Do you have problems with loss of bowel control? N  Managing your Medications? N  Managing your Finances? N  Housekeeping or managing your Housekeeping? N    Patient Care Team: Watt Mirza, MD as PCP - General  I have updated your Care Teams any recent Medical Services you may have received from other providers in the past year.     Assessment:   This is a routine wellness examination for Nicholas Thompson.  Hearing/Vision screen Hearing Screening - Comments:: No difficulties Vision Screening - Comments:: Wears glasses    Goals Addressed             This Visit's Progress    Patient Stated       To lose some weight        Depression Screen     12/29/2023    9:51 AM 04/05/2022   10:32 AM 09/14/2021  9:01 AM  05/29/2020    9:05 AM 03/28/2019    8:25 AM 03/20/2018    8:11 AM  PHQ 2/9 Scores  PHQ - 2 Score 0 0 0 0 0 0  PHQ- 9 Score 3         Fall Risk     12/29/2023    9:50 AM 04/05/2022   10:32 AM 09/14/2021    9:01 AM  Fall Risk   Falls in the past year? 0 0 0  Number falls in past yr: 0 0   Injury with Fall? 0 0   Risk for fall due to : No Fall Risks No Fall Risks   Follow up Falls evaluation completed Falls evaluation completed       Data saved with a previous flowsheet row definition    MEDICARE RISK AT HOME:  Medicare Risk at Home Any stairs in or around the home?: Yes If so, are there any without handrails?: No Home free of loose throw rugs in walkways, pet beds, electrical cords, etc?: Yes Adequate lighting in your home to reduce risk of falls?: Yes Life alert?: No Use of a cane, walker or w/c?: No Grab bars in the bathroom?: Yes Shower chair or bench in shower?: Yes Elevated toilet seat or a handicapped toilet?: Yes  TIMED UP AND GO:  Was the test performed?  No  Cognitive Function: 6CIT completed        12/29/2023    9:49 AM  6CIT Screen  What Year? 0 points  What month? 0 points  What time? 0 points  Count back from 20 0 points  Months in reverse 0 points  Repeat phrase 0 points  Total Score 0 points    Immunizations Immunization History  Administered Date(s) Administered   Fluad Quad(high Dose 65+) 02/17/2022   Fluad Trivalent(High Dose 65+) 01/03/2023   Influenza,inj,Quad PF,6+ Mos 01/11/2013, 01/30/2015, 02/27/2016, 05/18/2017, 01/11/2018   Influenza-Unspecified 11/21/2018, 12/04/2019   PFIZER(Purple Top)SARS-COV-2 Vaccination 06/23/2019, 07/14/2019, 01/17/2020   PNEUMOCOCCAL CONJUGATE-20 09/14/2021   Td 09/12/1998   Tdap 05/26/2011    Screening Tests Health Maintenance  Topic Date Due   Zoster Vaccines- Shingrix (1 of 2) Never done   DTaP/Tdap/Td (3 - Td or Tdap) 05/25/2021   Influenza Vaccine  10/21/2023   COVID-19 Vaccine (4 - 2025-26  season) 11/21/2023   Medicare Annual Wellness (AWV)  12/28/2024   Colonoscopy  07/07/2026   Pneumococcal Vaccine: 50+ Years  Completed   Hepatitis C Screening  Completed   Meningococcal B Vaccine  Aged Out    Health Maintenance Items Addressed:patient declined   Additional Screening:  Vision Screening: Recommended annual ophthalmology exams for early detection of glaucoma and other disorders of the eye. Is the patient up to date with their annual eye exam?  No  Who is the provider or what is the name of the office in which the patient attends annual eye exams?   Dental Screening: Recommended annual dental exams for proper oral hygiene  Community Resource Referral / Chronic Care Management: CRR required this visit?  No   CCM required this visit?  No   Plan:    I have personally reviewed and noted the following in the patient's chart:   Medical and social history Use of alcohol, tobacco or illicit drugs  Current medications and supplements including opioid prescriptions. Patient is not currently taking opioid prescriptions. Functional ability and status Nutritional status Physical activity Advanced directives List of other physicians Hospitalizations, surgeries, and ER visits in  previous 12 months Vitals Screenings to include cognitive, depression, and falls Referrals and appointments  In addition, I have reviewed and discussed with patient certain preventive protocols, quality metrics, and best practice recommendations. A written personalized care plan for preventive services as well as general preventive health recommendations were provided to patient.   Lyle MARLA Right, [place]   12/29/2023   After Visit Summary: (MyChart) Due to this being a telephonic visit, the after visit summary with patients personalized plan was offered to patient via MyChart   Notes: Nothing significant to report at this time.

## 2023-12-29 NOTE — Patient Instructions (Signed)
 Nicholas Thompson,  Thank you for taking the time for your Medicare Wellness Visit. I appreciate your continued commitment to your health goals. Please review the care plan we discussed, and feel free to reach out if I can assist you further.  Medicare recommends these wellness visits once per year to help you and your care team stay ahead of potential health issues. These visits are designed to focus on prevention, allowing your provider to concentrate on managing your acute and chronic conditions during your regular appointments.  Please note that Annual Wellness Visits do not include a physical exam. Some assessments may be limited, especially if the visit was conducted virtually. If needed, we may recommend a separate in-person follow-up with your provider.  Ongoing Care Seeing your primary care provider every 3 to 6 months helps us  monitor your health and provide consistent, personalized care.   Referrals If a referral was made during today's visit and you haven't received any updates within two weeks, please contact the referred provider directly to check on the status.  Recommended Screenings:  Health Maintenance  Topic Date Due   Zoster (Shingles) Vaccine (1 of 2) Never done   DTaP/Tdap/Td vaccine (3 - Td or Tdap) 05/25/2021   Flu Shot  10/21/2023   COVID-19 Vaccine (4 - 2025-26 season) 11/21/2023   Medicare Annual Wellness Visit  12/28/2024   Colon Cancer Screening  07/07/2026   Pneumococcal Vaccine for age over 39  Completed   Hepatitis C Screening  Completed   Meningitis B Vaccine  Aged Out       12/29/2023    9:51 AM  Advanced Directives  Does Patient Have a Medical Advance Directive? No  Would patient like information on creating a medical advance directive? No - Patient declined   Advance Care Planning is important because it: Ensures you receive medical care that aligns with your values, goals, and preferences. Provides guidance to your family and loved ones, reducing the  emotional burden of decision-making during critical moments.  Vision: Annual vision screenings are recommended for early detection of glaucoma, cataracts, and diabetic retinopathy. These exams can also reveal signs of chronic conditions such as diabetes and high blood pressure.  Dental: Annual dental screenings help detect early signs of oral cancer, gum disease, and other conditions linked to overall health, including heart disease and diabetes.  Please see the attached documents for additional preventive care recommendations.

## 2024-02-05 ENCOUNTER — Other Ambulatory Visit: Payer: Self-pay | Admitting: Family Medicine

## 2024-02-05 DIAGNOSIS — I10 Essential (primary) hypertension: Secondary | ICD-10-CM

## 2024-03-04 ENCOUNTER — Other Ambulatory Visit: Payer: Self-pay | Admitting: Family Medicine

## 2024-03-05 NOTE — Telephone Encounter (Signed)
Please schedule CPE with fasting labs prior for Dr. Copland.  

## 2024-03-05 NOTE — Telephone Encounter (Signed)
 lvm for pt to call office to schedule appt.

## 2024-03-17 ENCOUNTER — Other Ambulatory Visit: Payer: Self-pay | Admitting: Family Medicine

## 2024-03-17 DIAGNOSIS — R7309 Other abnormal glucose: Secondary | ICD-10-CM

## 2024-03-17 DIAGNOSIS — E782 Mixed hyperlipidemia: Secondary | ICD-10-CM

## 2024-03-17 DIAGNOSIS — Z125 Encounter for screening for malignant neoplasm of prostate: Secondary | ICD-10-CM

## 2024-03-17 NOTE — Progress Notes (Signed)
 Order labs for upcoming cpe with PCP

## 2024-03-19 ENCOUNTER — Other Ambulatory Visit (INDEPENDENT_AMBULATORY_CARE_PROVIDER_SITE_OTHER)

## 2024-03-19 DIAGNOSIS — R7309 Other abnormal glucose: Secondary | ICD-10-CM

## 2024-03-19 DIAGNOSIS — E782 Mixed hyperlipidemia: Secondary | ICD-10-CM | POA: Diagnosis not present

## 2024-03-19 DIAGNOSIS — Z125 Encounter for screening for malignant neoplasm of prostate: Secondary | ICD-10-CM | POA: Diagnosis not present

## 2024-03-19 LAB — COMPREHENSIVE METABOLIC PANEL WITH GFR
ALT: 18 U/L (ref 3–53)
AST: 17 U/L (ref 5–37)
Albumin: 4.3 g/dL (ref 3.5–5.2)
Alkaline Phosphatase: 77 U/L (ref 39–117)
BUN: 14 mg/dL (ref 6–23)
CO2: 30 meq/L (ref 19–32)
Calcium: 9.4 mg/dL (ref 8.4–10.5)
Chloride: 101 meq/L (ref 96–112)
Creatinine, Ser: 1.09 mg/dL (ref 0.40–1.50)
GFR: 69.25 mL/min
Glucose, Bld: 111 mg/dL — ABNORMAL HIGH (ref 70–99)
Potassium: 4.6 meq/L (ref 3.5–5.1)
Sodium: 140 meq/L (ref 135–145)
Total Bilirubin: 0.8 mg/dL (ref 0.2–1.2)
Total Protein: 6.7 g/dL (ref 6.0–8.3)

## 2024-03-19 LAB — LIPID PANEL
Cholesterol: 230 mg/dL — ABNORMAL HIGH (ref 28–200)
HDL: 40.7 mg/dL
LDL Cholesterol: 147 mg/dL — ABNORMAL HIGH (ref 10–99)
NonHDL: 189.73
Total CHOL/HDL Ratio: 6
Triglycerides: 214 mg/dL — ABNORMAL HIGH (ref 10.0–149.0)
VLDL: 42.8 mg/dL — ABNORMAL HIGH (ref 0.0–40.0)

## 2024-03-19 LAB — HEMOGLOBIN A1C: Hgb A1c MFr Bld: 5.7 % (ref 4.6–6.5)

## 2024-03-19 LAB — PSA: PSA: 2.09 ng/mL (ref 0.10–4.00)

## 2024-03-21 ENCOUNTER — Ambulatory Visit: Payer: Self-pay | Admitting: Family Medicine

## 2024-03-27 NOTE — Progress Notes (Unsigned)
 "    Charday Capetillo T. Gayland Nicol, MD, CAQ Sports Medicine [facility] [address] [place], [address]  Phone: [phone]  FAX: [phone]  Nicholas Thompson - 69 y.o. male  MRN [id number]  Date of Birth: 06/14/1955  Date: 03/28/2024  PCP: Watt Mirza, MD  Referral: Watt Mirza, MD  No chief complaint on file.  Patient Care Team: Watt Mirza, MD as PCP - General Subjective:   Nicholas Thompson is a 69 y.o. pleasant patient who presents with the following:  Discussed the use of AI scribe software for clinical note transcription with the patient, who gave verbal consent to proceed.  History of Present Illness     Preventative Health Maintenance Visit:  Health Maintenance Summary Reviewed and updated, unless pt declines services.  Tobacco History Reviewed. Alcohol: No concerns, no excessive use Exercise Habits: Some activity, rec at least 30 mins 5 times a week STD concerns: no risk or activity to increase risk Drug Use: None  Shingrix Tetanus booster Influenza COVID booster RSV  [name] is a fairly healthy gentleman with a remote history of renal cell carcinoma as well as thoracic aortic aneurysm.  Blood pressure has been historically well-controlled, and he is currently on hydrochlorothiazide  12.5 mg, enalapril  20 mg.  From a lipid standpoint, he does take Crestor  20 mg.  He does need thoracic aortic aneurysm follow-up, CT aortogram.  Health Maintenance  Topic Date Due   Zoster Vaccines- Shingrix (1 of 2) Never done   DTaP/Tdap/Td (3 - Td or Tdap) 05/25/2021   Influenza Vaccine  10/21/2023   COVID-19 Vaccine (4 - 2025-26 season) 11/21/2023   Medicare Annual Wellness (AWV)  12/28/2024   Colonoscopy  07/07/2026   Pneumococcal Vaccine: 50+ Years  Completed   Hepatitis C Screening  Completed   Meningococcal B Vaccine  Aged Out   Immunization History  Administered Date(s) Administered   Fluad Quad(high Dose 65+) 02/17/2022    Fluad Trivalent(High Dose 65+) 01/03/2023   Influenza,inj,Quad PF,6+ Mos 01/11/2013, 01/30/2015, 02/27/2016, 05/18/2017, 01/11/2018   Influenza-Unspecified 11/21/2018, 12/04/2019   PFIZER(Purple Top)SARS-COV-2 Vaccination 06/23/2019, 07/14/2019, 01/17/2020   PNEUMOCOCCAL CONJUGATE-20 09/14/2021   Td 09/12/1998   Tdap 05/26/2011   Patient Active Problem List   Diagnosis Date Noted   Thoracic aortic aneurysm without rupture 03/20/2018    Priority: High   Renal cell carcinoma of right kidney (HCC) 07/30/2015    Priority: High   Mixed hyperlipidemia 05/18/2007    Priority: Medium    HTN (hypertension) 05/18/2007    Priority: Medium    Pain in left lumbar region of back 06/22/2023   S/p RIGHT nephrectomy 07/30/2015   History of colonic polyps 08/07/2008   HYPERGLYCEMIA 05/18/2007   CARCINOMA, BASAL CELL, HX OF 05/18/2007    Past Medical History:  Diagnosis Date   Basal cell carcinoma    bASAL CELL   Chronic kidney disease    GERD (gastroesophageal reflux disease)    History of colonic polyps    Hyperlipidemia    Nephrolithiasis    Renal cell carcinoma of right kidney (HCC) 07/30/2015   Nephrectomy   S/p nephrectomy 07/30/2015   Unspecified essential hypertension     Past Surgical History:  Procedure Laterality Date   arthroscopy of knee Right 1989   CYSTOSCOPY WITH STENT PLACEMENT Left 06/11/2015   Procedure: CYSTOSCOPY WITH STENT PLACEMENT/ bilateral retrograde pyelogram;  Surgeon: Redell Lynwood Napoleon, MD;  Location: [facility];  Service: Urology;  Laterality: Left;   ESOPHAGEAL DILATION  1998 & 2001&2006   3 times   LAPAROSCOPIC NEPHRECTOMY, HAND ASSISTED Right 07/09/2015   Procedure: HAND ASSISTED LAPAROSCOPIC NEPHRECTOMY;  Surgeon: Redell Lynwood Napoleon, MD;  Location: [facility];  Service: Urology;  Laterality: Right;   MOHS SURGERY Left 04/2015   Ear, [facility]   SHOULDER ARTHROSCOPY W/ SUBACROMIAL DECOMPRESSION AND DISTAL CLAVICLE EXCISION Left 08-27-2007   TOTAL HIP ARTHROPLASTY  Left 08/30/2017   Procedure: TOTAL HIP ARTHROPLASTY ANTERIOR APPROACH;  Surgeon: Kathlynn Sharper, MD;  Location: [facility];  Service: Orthopedics;  Laterality: Left;   TOTAL KNEE ARTHROPLASTY Left 03/13/2020   Procedure: Left total knee arthroplasty - [place]ford Amber to Assist;  Surgeon: Kathlynn Sharper, MD;  Location: [facility];  Service: Orthopedics;  Laterality: Left;  Medford Amber to Assist   URETEROSCOPY WITH HOLMIUM LASER LITHOTRIPSY Bilateral 06/11/2015   Procedure: URETEROSCOPY WITH HOLMIUM LASER LITHOTRIPSY;  Surgeon: Redell Lynwood Napoleon, MD;  Location: [facility];  Service: Urology;  Laterality: Bilateral;   VASECTOMY      Family History  Problem Relation Age of Onset   Cancer Mother        bladder   Stroke Mother 25   Diabetes Father    Hyperlipidemia Father    Coronary artery disease Father    Cancer Paternal Uncle        brain   Stroke Maternal Grandfather    Cancer Paternal Grandfather        lung   Prostate cancer Neg Hx    Colon cancer Neg Hx    Esophageal cancer Neg Hx    Pancreatic cancer Neg Hx    Stomach cancer Neg Hx     Social History   Social History Narrative   Not on file    Past Medical History, Surgical History, Social History, Family History, Problem List, Medications, and Allergies have been reviewed and updated if relevant.  Review of Systems: Pertinent positives are listed above.  Otherwise, a full 14 point review of systems has been done in full and it is negative except where it is noted positive.  Objective:   There were no vitals taken for this visit. Ideal Body Weight:    Ideal Body Weight:   No results found.    12/29/2023    9:51 AM 04/05/2022   10:32 AM 09/14/2021    9:01 AM 05/29/2020    9:05 AM 03/28/2019    8:25 AM  Depression screen PHQ 2/9  Decreased Interest 0 0 0 0 0  Down, Depressed, Hopeless 0 0 0 0 0  PHQ - 2 Score 0 0 0 0 0  Altered sleeping 2      Tired, decreased energy 1      Change in appetite 0      Feeling bad or failure  about yourself  0      Trouble concentrating 0      Moving slowly or fidgety/restless 0      Suicidal thoughts 0      PHQ-9 Score 3       Difficult doing work/chores Not difficult at all         Data saved with a previous flowsheet row definition     GEN: well developed, well nourished, no acute distress Eyes: conjunctiva and lids normal, PERRLA, EOMI ENT: TM clear, nares clear, oral exam WNL Neck: supple, no lymphadenopathy, no thyromegaly, no JVD Pulm: clear to auscultation and percussion, respiratory effort normal CV: regular rate and rhythm, S1-S2, no murmur, rub or gallop, no bruits, peripheral  pulses normal and symmetric, no cyanosis, clubbing, edema or varicosities GI: soft, non-tender; no hepatosplenomegaly, masses; active bowel sounds all quadrants GU: deferred Lymph: no cervical, axillary or inguinal adenopathy MSK: gait normal, muscle tone and strength WNL, no joint swelling, effusions, discoloration, crepitus  SKIN: clear, good turgor, color WNL, no rashes, lesions, or ulcerations Neuro: normal mental status, normal strength, sensation, and motion Psych: alert; oriented to person, place and time, normally interactive and not anxious or depressed in appearance.  All labs reviewed with patient. Results for orders placed or performed in visit on 03/19/24  PSA   Collection Time: 03/19/24  8:52 AM  Result Value Ref Range   PSA 2.09 0.10 - 4.00 ng/mL  Hemoglobin A1c   Collection Time: 03/19/24  8:52 AM  Result Value Ref Range   Hgb A1c MFr Bld 5.7 4.6 - 6.5 %  Comprehensive metabolic panel with GFR   Collection Time: 03/19/24  8:52 AM  Result Value Ref Range   Sodium 140 135 - 145 mEq/L   Potassium 4.6 3.5 - 5.1 mEq/L   Chloride 101 96 - 112 mEq/L   CO2 30 19 - 32 mEq/L   Glucose, Bld 111 (H) 70 - 99 mg/dL   BUN 14 6 - 23 mg/dL   Creatinine, Ser 8.90 0.40 - 1.50 mg/dL   Total Bilirubin 0.8 0.2 - 1.2 mg/dL   Alkaline Phosphatase 77 39 - 117 U/L   AST 17 5 - 37 U/L    ALT 18 3 - 53 U/L   Total Protein 6.7 6.0 - 8.3 g/dL   Albumin 4.3 3.5 - 5.2 g/dL   GFR 30.74 >39.99 mL/min   Calcium  9.4 8.4 - 10.5 mg/dL  Lipid panel   Collection Time: 03/19/24  8:52 AM  Result Value Ref Range   Cholesterol 230 (H) 28 - 200 mg/dL   Triglycerides 785.9 (H) 10.0 - 149.0 mg/dL   HDL 59.29 >60.99 mg/dL   VLDL 57.1 (H) 0.0 - 59.9 mg/dL   LDL Cholesterol 852 (H) 10 - 99 mg/dL   Total CHOL/HDL Ratio 6    NonHDL 189.73     Assessment and Plan:     ICD-10-CM   1. Healthcare maintenance  Z00.00      Assessment & Plan   Health Maintenance Exam: The patient's preventative maintenance and recommended screening tests for an annual wellness exam were reviewed in full today. Brought up to date unless services declined.  Counselled on the importance of diet, exercise, and its role in overall health and mortality. The patient's FH and SH was reviewed, including their home life, tobacco status, and drug and alcohol status.  Follow-up in 1 year for physical exam or additional follow-up below.  Disposition: No follow-ups on file.  No orders of the defined types were placed in this encounter.  There are no discontinued medications. No orders of the defined types were placed in this encounter.   Signed,  Jacques DASEN. Yolander Goodie, MD   Allergies as of 03/28/2024       Reactions   Vicodin [hydrocodone -acetaminophen ] Nausea And Vomiting        Medication List        Accurate as of March 27, 2024  8:58 AM. If you have any questions, ask your nurse or doctor.          acetaminophen  325 MG tablet Commonly known as: TYLENOL  Take 1-2 tablets (325-650 mg total) by mouth every 6 (six) hours as needed for mild pain (pain score 1-3  or temp > 100.5).   B-12 PO Take 1 tablet by mouth daily.   cetirizine 10 MG tablet Commonly known as: ZYRTEC Take 10 mg by mouth daily.   enalapril  20 MG tablet Commonly known as: VASOTEC  TAKE 2 TABLETS DAILY    hydrochlorothiazide  12.5 MG tablet Commonly known as: HYDRODIURIL  TAKE 1 TABLET DAILY   ketoconazole  2 % cream Commonly known as: NIZORAL  APPLY 1 APPLICATION TOPICALLY DAILY AS NEEDED FOR IRRITATION   Melatonin 10 MG Caps Take 10 mg by mouth at bedtime as needed (sleep).   omeprazole  20 MG capsule Commonly known as: PRILOSEC TAKE 1 CAPSULE DAILY   rosuvastatin  20 MG tablet Commonly known as: Crestor  Take 1 tablet (20 mg total) by mouth daily.   tiZANidine  2 MG tablet Commonly known as: ZANAFLEX  Take 1 tablet (2 mg total) by mouth 3 (three) times daily as needed for muscle spasms.   VITAMIN D  PO Take 1 tablet by mouth daily.       "

## 2024-03-28 ENCOUNTER — Ambulatory Visit: Admitting: Family Medicine

## 2024-03-28 ENCOUNTER — Encounter: Payer: Self-pay | Admitting: Family Medicine

## 2024-03-28 ENCOUNTER — Telehealth: Payer: Self-pay | Admitting: *Deleted

## 2024-03-28 VITALS — BP 138/76 | HR 68 | Temp 97.3°F | Ht 71.0 in | Wt 279.1 lb

## 2024-03-28 DIAGNOSIS — I7121 Aneurysm of the ascending aorta, without rupture: Secondary | ICD-10-CM | POA: Diagnosis not present

## 2024-03-28 DIAGNOSIS — Z0000 Encounter for general adult medical examination without abnormal findings: Secondary | ICD-10-CM | POA: Diagnosis not present

## 2024-03-28 DIAGNOSIS — Z23 Encounter for immunization: Secondary | ICD-10-CM

## 2024-03-28 DIAGNOSIS — R7303 Prediabetes: Secondary | ICD-10-CM | POA: Insufficient documentation

## 2024-03-28 MED ORDER — ROSUVASTATIN CALCIUM 20 MG PO TABS: 20.0000 mg | ORAL_TABLET | Freq: Every day | ORAL | 3 refills | Status: AC

## 2024-03-28 NOTE — Telephone Encounter (Signed)
-----   Message from Jacques Schroeder, MD sent at 03/28/2024 10:26 AM EST ----- Would you call [facility]?  I completely forgot to talk to him about repeating his aorta imaging to follow-up on his aneurysm.  We need to check and make sure that is stable and has not changed.  I have already ordered the image.

## 2024-03-28 NOTE — Telephone Encounter (Signed)
 Left message for Nicholas Thompson that Dr. Watt completely forgot to talk to him about repeating his aorta imaging to follow-up on his aneurysm.  We need to check and make sure that is stable and has not changed.  Dr. Watt has already ordered the image and someone will be reaching out to him to schedule.  I ask that he call me back if he has any questions.

## 2024-03-28 NOTE — Patient Instructions (Addendum)
 Shingrix - will need to get at the pharmacy  Tetanus shot  RSV - consider getting at the pharmacy

## 2024-04-02 ENCOUNTER — Telehealth: Payer: Self-pay | Admitting: *Deleted

## 2024-04-02 NOTE — Addendum Note (Signed)
 Addended by: WENDELL ARLAND RAMAN on: 04/02/2024 11:34 AM   Modules accepted: Orders

## 2024-04-02 NOTE — Telephone Encounter (Signed)
 Spoke with Mr. Marschall.  He wants to do his CT Angio Chest done at [facility].  Order updated and faxed to [facility] at [phone].

## 2024-04-02 NOTE — Telephone Encounter (Signed)
 Copied from CRM #[id number]. Topic: Clinical - Medical Advice >> Apr 02, 2024 10:22 AM Chasity T wrote: Reason for CRM: Patient is calling requesting for Dr Watt CMA to give him a call, non urgent regarding an appointment. He did not to fully explain to me what it was.   Phone number: [phone]

## 2024-04-26 ENCOUNTER — Telehealth: Payer: Self-pay | Admitting: Family Medicine

## 2024-04-26 NOTE — Telephone Encounter (Signed)
 I got a fax about Nicholas Thompson's CT aortogram to follow his known thoracic aortic aneurysm.  This should easily be approved when you include the information below.  Known 4.2 ascending aortic aneurysm.  CLINICAL DATA:  Aortic aneurysm suspected f/u aortic aneurysm   EXAM: CT ANGIOGRAPHY CHEST WITH CONTRAST   TECHNIQUE: Multidetector CT imaging of the chest was performed using the standard protocol during bolus administration of intravenous contrast. Multiplanar CT image reconstructions and MIPs were obtained to evaluate the vascular anatomy.   RADIATION DOSE REDUCTION: This exam was performed according to the departmental dose-optimization program which includes automated exposure control, adjustment of the mA and/or kV according to patient size and/or use of iterative reconstruction technique.   CONTRAST:  75mL ISOVUE -370 IOPAMIDOL  (ISOVUE -370) INJECTION 76%   COMPARISON:  December 2018   FINDINGS: Cardiovascular: Preferential opacification of the thoracic aorta. Ascending thoracic aortic aneurysm measures 4.2 x 4.0 cm, unchanged since 2018 when it measured 4.2 cm in maximum dimension. Scattered atherosclerotic calcifications. Pulmonary arteries are patent centrally and normal in caliber. Normal heart size. No pericardial effusion.   Mediastinum/Nodes: No enlarged mediastinal, hilar, or axillary lymph nodes. The thyroid  gland appears normal.   Lungs/Pleura: No pleural effusion. No pneumothorax. No mass or focal consolidation. No suspicious pulmonary nodules.   Musculoskeletal: No aggressive osseous lesions. Scattered degenerative changes of the spine.   Upper abdomen: The visualized upper abdomen is unremarkable.   Review of the MIP images confirms the above findings.   IMPRESSION: 1. Ascending thoracic aortic aneurysm measures 4.2 cm, unchanged since 2018. Recommend annual imaging followup by CTA or MRA. This recommendation follows  2010 ACCF/AHA/AATS/ACR/ASA/SCA/SCAI/SIR/STS/SVM Guidelines for the Diagnosis and Management of Patients with Thoracic Aortic Disease. Circulation. 2010; 121: Z733-z630. Aortic aneurysm NOS (ICD10-I71.9)     Electronically Signed   By: Felton Fry M.D.   On: 02/25/2022 15:51

## 2024-04-27 NOTE — Telephone Encounter (Signed)
 This has already been authorized but I will need to call the insurance company to change the imaging site from [facility] to [facility]. Thank you for letting me know this!
